# Patient Record
Sex: Female | Born: 1945 | Race: White | Hispanic: No | Smoking: Never smoker
Health system: Southern US, Community
[De-identification: ages and names within clinical notes are randomized; demographics above are authoritative.]

## PROBLEM LIST (undated history)

## (undated) DIAGNOSIS — R112 Nausea with vomiting, unspecified: Secondary | ICD-10-CM

## (undated) DIAGNOSIS — E119 Type 2 diabetes mellitus without complications: Secondary | ICD-10-CM

## (undated) DIAGNOSIS — I10 Essential (primary) hypertension: Secondary | ICD-10-CM

## (undated) DIAGNOSIS — Z955 Presence of coronary angioplasty implant and graft: Secondary | ICD-10-CM

## (undated) DIAGNOSIS — K219 Gastro-esophageal reflux disease without esophagitis: Secondary | ICD-10-CM

## (undated) DIAGNOSIS — Z9889 Other specified postprocedural states: Secondary | ICD-10-CM

## (undated) DIAGNOSIS — K760 Fatty (change of) liver, not elsewhere classified: Secondary | ICD-10-CM

## (undated) DIAGNOSIS — C50919 Malignant neoplasm of unspecified site of unspecified female breast: Secondary | ICD-10-CM

## (undated) DIAGNOSIS — I2510 Atherosclerotic heart disease of native coronary artery without angina pectoris: Secondary | ICD-10-CM

## (undated) DIAGNOSIS — C50911 Malignant neoplasm of unspecified site of right female breast: Secondary | ICD-10-CM

## (undated) HISTORY — PX: RETINAL DETACHMENT SURGERY: SHX105

## (undated) HISTORY — PX: TONSILLECTOMY: SUR1361

## (undated) HISTORY — DX: Malignant neoplasm of unspecified site of right female breast: C50.911

## (undated) HISTORY — PX: ABDOMINAL HYSTERECTOMY: SHX81

## (undated) HISTORY — PX: FRACTURE SURGERY: SHX138

## (undated) HISTORY — DX: Malignant neoplasm of unspecified site of unspecified female breast: C50.919

---

## 1999-08-08 ENCOUNTER — Ambulatory Visit (HOSPITAL_COMMUNITY): Admission: RE | Admit: 1999-08-08 | Discharge: 1999-08-09 | Payer: Self-pay | Admitting: *Deleted

## 2000-11-26 ENCOUNTER — Other Ambulatory Visit: Admission: RE | Admit: 2000-11-26 | Discharge: 2000-11-26 | Payer: Self-pay | Admitting: Family Medicine

## 2001-03-01 ENCOUNTER — Ambulatory Visit (HOSPITAL_COMMUNITY): Admission: RE | Admit: 2001-03-01 | Discharge: 2001-03-01 | Payer: Self-pay | Admitting: Cardiology

## 2001-03-01 ENCOUNTER — Encounter: Payer: Self-pay | Admitting: Cardiology

## 2002-04-08 ENCOUNTER — Encounter: Payer: Self-pay | Admitting: Family Medicine

## 2002-04-08 ENCOUNTER — Ambulatory Visit (HOSPITAL_COMMUNITY): Admission: RE | Admit: 2002-04-08 | Discharge: 2002-04-08 | Payer: Self-pay | Admitting: Family Medicine

## 2002-04-28 ENCOUNTER — Ambulatory Visit (HOSPITAL_COMMUNITY): Admission: RE | Admit: 2002-04-28 | Discharge: 2002-04-28 | Payer: Self-pay | Admitting: General Surgery

## 2003-10-09 ENCOUNTER — Ambulatory Visit (HOSPITAL_COMMUNITY): Admission: RE | Admit: 2003-10-09 | Discharge: 2003-10-09 | Payer: Self-pay | Admitting: Family Medicine

## 2003-10-16 ENCOUNTER — Ambulatory Visit (HOSPITAL_COMMUNITY): Admission: RE | Admit: 2003-10-16 | Discharge: 2003-10-16 | Payer: Self-pay | Admitting: Family Medicine

## 2005-12-11 ENCOUNTER — Ambulatory Visit (HOSPITAL_COMMUNITY): Admission: RE | Admit: 2005-12-11 | Discharge: 2005-12-11 | Payer: Self-pay | Admitting: Family Medicine

## 2005-12-17 ENCOUNTER — Ambulatory Visit (HOSPITAL_COMMUNITY): Admission: RE | Admit: 2005-12-17 | Discharge: 2005-12-17 | Payer: Self-pay | Admitting: Family Medicine

## 2006-01-06 ENCOUNTER — Ambulatory Visit: Payer: Self-pay | Admitting: Cardiology

## 2006-02-11 ENCOUNTER — Ambulatory Visit: Payer: Self-pay

## 2006-02-14 ENCOUNTER — Ambulatory Visit (HOSPITAL_COMMUNITY): Admission: RE | Admit: 2006-02-14 | Discharge: 2006-02-14 | Payer: Self-pay | Admitting: Family Medicine

## 2006-02-19 ENCOUNTER — Ambulatory Visit (HOSPITAL_COMMUNITY): Admission: RE | Admit: 2006-02-19 | Discharge: 2006-02-19 | Payer: Self-pay | Admitting: Family Medicine

## 2006-05-13 ENCOUNTER — Encounter: Admission: RE | Admit: 2006-05-13 | Discharge: 2006-05-13 | Payer: Self-pay | Admitting: Endocrinology

## 2006-05-13 ENCOUNTER — Encounter (INDEPENDENT_AMBULATORY_CARE_PROVIDER_SITE_OTHER): Payer: Self-pay | Admitting: Specialist

## 2006-05-13 ENCOUNTER — Other Ambulatory Visit: Admission: RE | Admit: 2006-05-13 | Discharge: 2006-05-13 | Payer: Self-pay | Admitting: Interventional Radiology

## 2007-02-08 ENCOUNTER — Ambulatory Visit (HOSPITAL_COMMUNITY): Admission: RE | Admit: 2007-02-08 | Discharge: 2007-02-08 | Payer: Self-pay | Admitting: Family Medicine

## 2007-02-19 ENCOUNTER — Ambulatory Visit: Payer: Self-pay | Admitting: Cardiology

## 2008-04-19 ENCOUNTER — Encounter: Admission: RE | Admit: 2008-04-19 | Discharge: 2008-04-19 | Payer: Self-pay | Admitting: Family Medicine

## 2008-04-19 ENCOUNTER — Encounter (INDEPENDENT_AMBULATORY_CARE_PROVIDER_SITE_OTHER): Payer: Self-pay | Admitting: Radiology

## 2008-04-25 ENCOUNTER — Ambulatory Visit: Payer: Self-pay | Admitting: Oncology

## 2008-04-26 ENCOUNTER — Encounter: Admission: RE | Admit: 2008-04-26 | Discharge: 2008-04-26 | Payer: Self-pay | Admitting: Family Medicine

## 2008-04-27 ENCOUNTER — Encounter (HOSPITAL_COMMUNITY): Admission: RE | Admit: 2008-04-27 | Discharge: 2008-05-27 | Payer: Self-pay | Admitting: Oncology

## 2008-04-27 ENCOUNTER — Ambulatory Visit (HOSPITAL_COMMUNITY): Payer: Self-pay | Admitting: Oncology

## 2008-04-28 ENCOUNTER — Ambulatory Visit (HOSPITAL_COMMUNITY): Admission: RE | Admit: 2008-04-28 | Discharge: 2008-04-28 | Payer: Self-pay | Admitting: Oncology

## 2008-04-28 ENCOUNTER — Encounter: Payer: Self-pay | Admitting: Oncology

## 2008-05-11 ENCOUNTER — Ambulatory Visit (HOSPITAL_BASED_OUTPATIENT_CLINIC_OR_DEPARTMENT_OTHER): Admission: RE | Admit: 2008-05-11 | Discharge: 2008-05-11 | Payer: Self-pay | Admitting: Surgery

## 2008-05-30 ENCOUNTER — Encounter (HOSPITAL_COMMUNITY): Admission: RE | Admit: 2008-05-30 | Discharge: 2008-06-29 | Payer: Self-pay | Admitting: Oncology

## 2008-06-05 ENCOUNTER — Ambulatory Visit (HOSPITAL_COMMUNITY): Admission: RE | Admit: 2008-06-05 | Discharge: 2008-06-05 | Payer: Self-pay | Admitting: Oncology

## 2008-06-10 ENCOUNTER — Ambulatory Visit (HOSPITAL_COMMUNITY): Payer: Self-pay | Admitting: Oncology

## 2008-06-12 ENCOUNTER — Ambulatory Visit: Payer: Self-pay | Admitting: Genetic Counselor

## 2008-06-28 ENCOUNTER — Encounter: Admission: RE | Admit: 2008-06-28 | Discharge: 2008-06-28 | Payer: Self-pay | Admitting: Oncology

## 2008-06-30 ENCOUNTER — Encounter (HOSPITAL_COMMUNITY): Admission: RE | Admit: 2008-06-30 | Discharge: 2008-07-30 | Payer: Self-pay | Admitting: Oncology

## 2008-07-31 ENCOUNTER — Ambulatory Visit (HOSPITAL_COMMUNITY): Payer: Self-pay | Admitting: Oncology

## 2008-07-31 ENCOUNTER — Encounter (HOSPITAL_COMMUNITY): Admission: RE | Admit: 2008-07-31 | Discharge: 2008-08-30 | Payer: Self-pay | Admitting: Oncology

## 2008-08-18 ENCOUNTER — Ambulatory Visit: Payer: Self-pay | Admitting: Genetic Counselor

## 2008-08-21 HISTORY — PX: BREAST SURGERY: SHX581

## 2008-09-13 ENCOUNTER — Encounter (INDEPENDENT_AMBULATORY_CARE_PROVIDER_SITE_OTHER): Payer: Self-pay | Admitting: Surgery

## 2008-09-13 ENCOUNTER — Encounter: Admission: RE | Admit: 2008-09-13 | Discharge: 2008-09-13 | Payer: Self-pay | Admitting: Surgery

## 2008-09-13 ENCOUNTER — Ambulatory Visit (HOSPITAL_COMMUNITY): Admission: RE | Admit: 2008-09-13 | Discharge: 2008-09-13 | Payer: Self-pay | Admitting: Surgery

## 2008-10-03 ENCOUNTER — Ambulatory Visit: Admission: RE | Admit: 2008-10-03 | Discharge: 2008-11-28 | Payer: Self-pay | Admitting: Radiation Oncology

## 2008-12-04 ENCOUNTER — Ambulatory Visit (HOSPITAL_COMMUNITY): Payer: Self-pay | Admitting: Oncology

## 2008-12-06 ENCOUNTER — Encounter (HOSPITAL_COMMUNITY): Admission: RE | Admit: 2008-12-06 | Discharge: 2008-12-06 | Payer: Self-pay | Admitting: Oncology

## 2009-02-14 ENCOUNTER — Encounter (INDEPENDENT_AMBULATORY_CARE_PROVIDER_SITE_OTHER): Payer: Self-pay | Admitting: *Deleted

## 2009-04-26 ENCOUNTER — Encounter: Admission: RE | Admit: 2009-04-26 | Discharge: 2009-04-26 | Payer: Self-pay | Admitting: Oncology

## 2009-05-08 ENCOUNTER — Encounter (HOSPITAL_COMMUNITY): Admission: RE | Admit: 2009-05-08 | Discharge: 2009-06-07 | Payer: Self-pay | Admitting: Oncology

## 2009-05-08 ENCOUNTER — Ambulatory Visit (HOSPITAL_COMMUNITY): Payer: Self-pay | Admitting: Oncology

## 2009-09-06 DIAGNOSIS — C50911 Malignant neoplasm of unspecified site of right female breast: Secondary | ICD-10-CM

## 2009-09-06 DIAGNOSIS — E785 Hyperlipidemia, unspecified: Secondary | ICD-10-CM | POA: Insufficient documentation

## 2009-09-06 DIAGNOSIS — I10 Essential (primary) hypertension: Secondary | ICD-10-CM | POA: Insufficient documentation

## 2009-09-06 DIAGNOSIS — I2510 Atherosclerotic heart disease of native coronary artery without angina pectoris: Secondary | ICD-10-CM | POA: Insufficient documentation

## 2009-09-06 DIAGNOSIS — E782 Mixed hyperlipidemia: Secondary | ICD-10-CM | POA: Insufficient documentation

## 2009-09-06 HISTORY — DX: Malignant neoplasm of unspecified site of right female breast: C50.911

## 2009-09-12 ENCOUNTER — Ambulatory Visit: Payer: Self-pay | Admitting: Cardiology

## 2009-09-12 DIAGNOSIS — R609 Edema, unspecified: Secondary | ICD-10-CM | POA: Insufficient documentation

## 2009-09-12 DIAGNOSIS — R0602 Shortness of breath: Secondary | ICD-10-CM | POA: Insufficient documentation

## 2009-10-01 ENCOUNTER — Telehealth (INDEPENDENT_AMBULATORY_CARE_PROVIDER_SITE_OTHER): Payer: Self-pay | Admitting: *Deleted

## 2009-10-02 ENCOUNTER — Ambulatory Visit: Payer: Self-pay | Admitting: Cardiology

## 2009-10-02 ENCOUNTER — Encounter: Payer: Self-pay | Admitting: Cardiology

## 2009-10-02 ENCOUNTER — Ambulatory Visit: Payer: Self-pay

## 2009-10-02 ENCOUNTER — Encounter (HOSPITAL_COMMUNITY): Admission: RE | Admit: 2009-10-02 | Discharge: 2009-11-21 | Payer: Self-pay | Admitting: Cardiology

## 2009-10-02 ENCOUNTER — Ambulatory Visit (HOSPITAL_COMMUNITY): Admission: RE | Admit: 2009-10-02 | Discharge: 2009-10-02 | Payer: Self-pay | Admitting: Cardiology

## 2009-10-10 ENCOUNTER — Telehealth: Payer: Self-pay | Admitting: Cardiology

## 2009-11-06 ENCOUNTER — Ambulatory Visit (HOSPITAL_COMMUNITY): Payer: Self-pay | Admitting: Oncology

## 2009-11-06 ENCOUNTER — Encounter (HOSPITAL_COMMUNITY): Admission: RE | Admit: 2009-11-06 | Discharge: 2009-12-06 | Payer: Self-pay | Admitting: Oncology

## 2009-12-03 ENCOUNTER — Ambulatory Visit: Payer: Self-pay | Admitting: Internal Medicine

## 2009-12-03 DIAGNOSIS — K219 Gastro-esophageal reflux disease without esophagitis: Secondary | ICD-10-CM | POA: Insufficient documentation

## 2009-12-03 DIAGNOSIS — K921 Melena: Secondary | ICD-10-CM | POA: Insufficient documentation

## 2009-12-18 ENCOUNTER — Telehealth (INDEPENDENT_AMBULATORY_CARE_PROVIDER_SITE_OTHER): Payer: Self-pay

## 2009-12-19 ENCOUNTER — Ambulatory Visit (HOSPITAL_COMMUNITY): Admission: RE | Admit: 2009-12-19 | Discharge: 2009-12-19 | Payer: Self-pay | Admitting: Internal Medicine

## 2009-12-19 ENCOUNTER — Ambulatory Visit: Payer: Self-pay | Admitting: Gastroenterology

## 2010-05-07 ENCOUNTER — Encounter (HOSPITAL_COMMUNITY): Admission: RE | Admit: 2010-05-07 | Payer: Self-pay | Admitting: Oncology

## 2010-05-24 ENCOUNTER — Encounter: Admission: RE | Admit: 2010-05-24 | Discharge: 2010-05-24 | Payer: Self-pay | Admitting: Oncology

## 2010-07-14 ENCOUNTER — Encounter: Payer: Self-pay | Admitting: Oncology

## 2010-07-14 ENCOUNTER — Encounter: Payer: Self-pay | Admitting: Endocrinology

## 2010-07-14 ENCOUNTER — Encounter (HOSPITAL_COMMUNITY): Payer: Self-pay | Admitting: Oncology

## 2010-07-14 ENCOUNTER — Encounter: Payer: Self-pay | Admitting: Family Medicine

## 2010-07-25 NOTE — Assessment & Plan Note (Signed)
Summary: OCCASIONAL BLOOD AFTER BM/SS   Visit Type:  Initial Consult Referring Provider:  Mariel Sleet Primary Care Provider:  Phillips Odor  Chief Complaint:  occasional blood after BM.  History of Present Illness: Erika Ramos is a very pleasant 65 y/o WF, patient of Dr. Glenford Peers, who presents for further evaluation of intermittent brbpr. She has had six episodes of brbpr in last six months. Sometimes after constipation but not always. BM usually most days. Certain foods may cause loose or frequent stools. If miss Prevacid then terrible acid reflux. No prior EGD. Heartburn at least 10 years. Denies dysphagia or odynophagia. No abd pain.   She had TCS about 8 years ago by Dr. Lovell Sheehan, no polyps.  Current Medications (verified): 1)  Ramipril 10 Mg Caps (Ramipril) .Marland Kitchen.. 1 Cap Once Daily 2)  Simvastatin 20 Mg Tabs (Simvastatin) .Marland Kitchen.. 1  Once Daily 3)  Multivitamins  Tabs (Multiple Vitamin) .Marland Kitchen.. 1 Once Daily 4)  Aspirin 81 Mg Tbec (Aspirin) .... Take 1 Tablet By Mouth Once A Day 5)  Prevacid 15 Mg Cpdr (Lansoprazole)  Otc .... Take 1 Tablet By Mouth Once A Day 6)  Vitamin B Complex .... One Tablet Daily  Allergies (verified): 1)  ! 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   SURGEON:  Maisie Fus A. Cornett, MD  Right axillary lymph node dissection.  Explantation of right subclavian Port-A-Cath.  Hysterectomy coronary stent placement, 2001  Family History: Mother: deceased @ 12.Marland KitchenOvarian Ca and MI..1st MI age 46 Grandparents, CVA Father, deceased @83 , trauma to head-blood clot in head Family History of Coronary Artery Disease:  Siblings: 3 brothers and 3 sisters..1 brother had CABG x 5 @ 46 No FH of CRC, liver disease,  colon polyps  Social History: Single. One dgt, One son. 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 Never smoked. No alcohol.   Review of Systems General:  Denies fever, chills, sweats, anorexia, fatigue, weakness, and weight loss. Eyes:  Denies vision loss. ENT:  Denies nasal congestion, sore throat, hoarseness, and difficulty swallowing. CV:  Denies chest pains, angina, palpitations, dyspnea on exertion, and peripheral edema. Resp:  Denies dyspnea at rest, dyspnea with exercise, cough, sputum, and wheezing. GI:  See HPI. GU:  Denies urinary burning and blood in urine. MS:  Denies joint pain / LOM. Derm:  Denies rash and itching. Neuro:  Denies weakness, frequent headaches, memory loss, and confusion. Psych:  Denies depression and anxiety. Endo:  Denies unusual weight change. Heme:  Denies bruising and bleeding. Allergy:  Denies hives and rash.  Vital Signs:  Patient profile:   65 year old female Height:      66.5 inches Weight:      219 pounds BMI:     34.94 Temp:     98.4 degrees F oral Pulse rate:   72 / minute BP sitting:   120 / 78  (left arm) Cuff size:   large  Vitals Entered By: Cloria Spring LPN (December 03, 2009 1:24 PM)  Physical Exam  General:  Well developed, well nourished, no acute distress.obese.   Head:  Normocephalic and atraumatic. Eyes:  sclera nonicteric Mouth:  Oropharyngeal mucosa moist, pink.  No lesions, erythema or exudate.    Neck:  Supple; no masses or thyromegaly. Lungs:  Clear throughout to auscultation. Heart:  Regular rate and rhythm; no murmurs, rubs,  or  bruits. Abdomen:  Bowel sounds normal.  Abdomen is soft, nontender, nondistended.  No rebound or guarding.  No hepatosplenomegaly, masses or hernias.  No abdominal bruits. obese.   Rectal:  deferred until time of colonoscopy.   Extremities:  No clubbing, cyanosis, edema or deformities noted. Neurologic:  Alert and  oriented x4;  grossly normal neurologically. Skin:  Intact without significant lesions or  rashes. Cervical Nodes:  No significant cervical adenopathy. Psych:  Alert and cooperative. Normal mood and affect.  Impression & Recommendations:  Problem # 1:  HEMATOCHEZIA (ICD-578.1)  Intermittent brbpr with remote TCS by Dr. Lovell Sheehan. Likely benign anorectal source, but needs further evaluation. Colonoscopy to be performed in near future.  Risks, alternatives, and benefits including but not limited to the risk of reaction to medication, bleeding, infection, and perforation were addressed.  Patient voiced understanding and provided verbal consent.   With h/o breast cancer, no FH CRC, will make decision regarding timing of subsequent colonoscopies based on findings.  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 No prior EGD. Recommend EGD for surveillance for complications of GERD ie Barrett's. EGD to be performed in near future.  Risks, alternatives, benefits including but not limited to risk of reaction to medications, bleeding, infection, and perforation addressed.  Patient voiced understanding and verbal consent obtained. Continue Prevacid 24hr once daily.  Orders: Consultation Level IV (30865) I would like to thank Dr. Glenford Peers for allowing [place] to take part in the care of this nice patient.

## 2010-07-25 NOTE — Letter (Signed)
Summary: TCS/EGD ORDER  TCS/EGD ORDER   Imported By: Ave Filter 12/03/2009 14:34:11  _____________________________________________________________________  External Attachment:    Type:   Image     Comment:   External Document  Appended Document: TCS/EGD ORDER TCS/EGD TRILYTE PREP ON 6/29.  Appended Document: TCS/EGD ORDER Pt scheduled for 12/19/2009 @ 8:15 and to be at hospital @ 7:15. Rx and instructions faxed to [facility].

## 2010-07-25 NOTE — Assessment & Plan Note (Signed)
Summary: Erika Ramos   Primary Provider:  Dr. Phillips Odor   History of Present Illness: Erika Ramos comes in today for evaluation and management of her history of coronary artery disease, percutaneous intervention in 2001, and recent increase in dyspnea on exertion.  She has had a diagnosis of breast cancer since we last saw her. She had both chemotherapy and radiation. She does not remember the type of chemotherapy. I have no records.  She denies orthopnea or PND but has had some peripheral edema. She denies any angina per se. She's had no palpitations or syncope.  She remains heavy. Weight is not changed that much however.  Current Medications (verified): 1)  Aspirin Ec 325 Mg Tbec (Aspirin) .... Take One Tablet By Mouth Daily 2)  Omeprazole 20 Mg Cpdr (Omeprazole) .Marland Kitchen.. 1 Tab Once Daily 3)  Ramipril 10 Mg Caps (Ramipril) .Marland Kitchen.. 1 Cap Once Daily 4)  Simvastatin 20 Mg Tabs (Simvastatin) .Marland Kitchen.. 1  Once Daily 5)  Multivitamins  Tabs (Multiple Vitamin) .Marland Kitchen.. 1 Once Daily  Allergies: 1)  ! 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 Right breast needle-localized lumpectomy.   SURGEON:  Maisie Fus A. Cornett, MD   2. Right axillary lymph node dissection.   3. Explantation of right subclavian Port-A-Cath.     Hysterectomy coronary stent placement  Family History: Last updated: September 19, 2009 Mother: deceased @ 60.Marland KitchenOvarian Ca and MI..1st MI age 62 Family History of Coronary Artery Disease:  Siblings: 3 brothers and 3 sisters..1 brother had CABG x 5 @ 52  Social History: Last updated: 09/19/2009 Full Time Part Time  Single   Review of Systems       negative other than history of present illness  Vital Signs:  Patient profile:   65 year old  female Height:      66.5 inches Weight:      210 pounds BMI:     33.51 Pulse rate:   87 / minute Pulse rhythm:   irregular BP sitting:   108 / 80  (left arm) Cuff size:   large  Physical Exam  General:  obese.   Head:  normocephalic and atraumatic Eyes:  PERRLA/EOM intact; conjunctiva and lids normal. Neck:  Neck supple, no JVD. No masses, thyromegaly or abnormal cervical nodes. Chest Chameka Mcmullen:  no deformities or breast masses noted Lungs:  Clear bilaterally to auscultation and percussion. Heart:  PMI is hard to appreciate, regular rate and rhythm, normal S1-S2, no gallop, no bruits Msk:  decreased ROM.   Pulses:  pulses normal in all 4 extremities Extremities:  1+ left pedal edema and 1+ right pedal edema.   Neurologic:  Alert and oriented x 3. Skin:  Intact without lesions or rashes. Psych:  Normal affect.   Problems:  Medical Problems Added: 1)  Dx of Dyspnea  (ICD-786.05)  EKG  Procedure date:  09/12/2009  Findings:      normal sinus rhythm, left anterior fascicular block, no significant change, poor R. progression in the anterior and lateral precordium.  Impression & Recommendations:  Problem # 1:  CAD, NATIVE VESSEL (ICD-414.01)  She Has not had objective assessment of her coronary disease in 4 years. We will obtain a stress Myoview. With her history of chemotherapy and radiation, disease more important. We will also obtain a 2-D echocardiogram to assess her LV function. Her updated medication  list for this problem includes:    Aspirin Ec 325 Mg Tbec (Aspirin) .Marland Kitchen... Take one tablet by mouth daily    Ramipril 10 Mg Caps (Ramipril) .Marland Kitchen... 1 cap once daily  Her updated medication list for this problem includes:    Aspirin Ec 325 Mg Tbec (Aspirin) .Marland Kitchen... Take one tablet by mouth daily    Ramipril 10 Mg Caps (Ramipril) .Marland Kitchen... 1 cap once daily  Orders: EKG w/ Interpretation (93000)  Problem # 2:  DYSPNEA (ICD-786.05) Assessment: New  This could be an ischemic  equivalent. Am also concerned that she may have had some cardiac damage from the chemotherapy. We'll arrange a 2-D echocardiogram and stress test as mentioned above. Her updated medication list for this problem includes:    Aspirin Ec 325 Mg Tbec (Aspirin) .Marland Kitchen... Take one tablet by mouth daily    Ramipril 10 Mg Caps (Ramipril) .Marland Kitchen... 1 cap once daily  Orders: Echocardiogram (Echo) Nuclear Stress Test (Nuc Stress Test)  Patient Instructions: 1)  Your physician recommends that you schedule a follow-up appointment in: YEAR WITH DR English Tomer 2)  Your physician recommends that you continue on your current medications as directed. Please refer to the Current Medication list given to you today. 3)  Your physician has requested that you have an echocardiogram.  Echocardiography is a painless test that uses sound waves to create images of your heart. It provides your doctor with information about the size and shape of your heart and how well your heart's chambers and valves are working.  This procedure takes approximately one hour. There are no restrictions for this procedure. 4)  Your physician has requested that you have an exercise stress myoview.  For further information please visit [email or web address]  Please follow instruction sheet, as given.

## 2010-07-25 NOTE — Letter (Signed)
Summary: Internal Other/Rx and instructions for procedure  Internal Other/Rx and instructions for procedure   Imported By: Cloria Spring LPN 16/03/9603 54:09:81  _____________________________________________________________________  External Attachment:    Type:   Image     Comment:   External Document

## 2010-07-25 NOTE — Progress Notes (Signed)
Summary: call back  Phone Note Call from Patient Call back at Home Phone [phone] Call back at Work Phone [phone] Call back at ext [phone]   Caller: Patient Reason for Call: Talk to Nurse Summary of Call: returning christine call Initial call taken by: Lorne Skeens,  October 10, 2009 9:25 AM  Follow-up for Phone Call        Phone Call Completed Follow-up by: Scherrie Bateman, LPN,  October 10, 2009 9:31 AM

## 2010-07-25 NOTE — Assessment & Plan Note (Signed)
Summary: F2Y/ GD   Visit Type:  2 yr f/u Primary Provider:  Dr. Phillips Odor  CC:  no cardiac complaints today.  History of Present Illness: [name] as today for evaluation for coronary artery disease and hypertension.  She's been on remarkably well. She denies any angina, palpitations, shortness of breath, dyspnea on exertion, orthopnea, PND. She has had some lower showing edema from her superficial varicose veins. She denies any pleuritic chest pain, swelling or redness, or claudication.  She is being seen by a pain specialist for the pain from the superficial veins.  Current Medications (verified): 1)  Aspirin Ec 325 Mg Tbec (Aspirin) .... Take One Tablet By Mouth Daily 2)  Omeprazole 20 Mg Cpdr (Omeprazole) .Marland Kitchen.. 1 Tab Once Daily 3)  Ramipril 10 Mg Caps (Ramipril) .Marland Kitchen.. 1 Cap Once Daily 4)  Simvastatin 20 Mg Tabs (Simvastatin) .Marland Kitchen.. 1  Once Daily 5)  Multivitamins  Tabs (Multiple Vitamin) .Marland Kitchen.. 1 Once Daily  Allergies: 1)  ! 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 Right breast needle-localized lumpectomy.   SURGEON:  Maisie Fus A. Cornett, MD   2. Right axillary lymph node dissection.   3. Explantation of right subclavian Port-A-Cath.     Hysterectomy coronary stent placement  Family History: Last updated: 09-10-09 Mother: deceased @ 39.Marland KitchenOvarian Ca and MI..1st MI age 20 Family History of Coronary Artery Disease:  Siblings: 3 brothers and 3 sisters..1 brother had CABG x 5 @ 15  Social History: Last updated: 09-10-09 Full Time Part Time  Single   Review of Systems       negative other than history of present illness  Vital Signs:  Patient profile:   65 year old female Height:      66.50 inches Weight:      210 pounds BMI:      33.51 Pulse rate:   87 / minute Pulse rhythm:   irregular BP sitting:   108 / 80  (left arm) Cuff size:   large  Vitals Entered By: Scherrie Bateman, LPN (September 12, 2009 2:34 PM)  Physical Exam  General:  obese.   Head:  normocephalic and atraumatic Eyes:  PERRLA/EOM intact; conjunctiva and lids normal. Neck:  Neck supple, no JVD. No masses, thyromegaly or abnormal cervical nodes. Chest Cheril Slattery:  no deformities or breast masses noted Lungs:  Clear bilaterally to auscultation and percussion. Heart:  PMI not displaced, normal S1-S2, regular rate and rhythm Abdomen:  Bowel sounds positive; abdomen soft and non-tender without masses, organomegaly, or hernias noted. No hepatosplenomegaly. Msk:  Back normal, normal gait. Muscle strength and tone normal. Pulses:  pulses normal in all 4 extremities Extremities:  varicose veins, 1+ pitting edema at the ankles. Neurologic:  Alert and oriented x 3. Skin:  Intact without lesions or rashes. Psych:  Normal affect.   Problems:  Medical Problems Added: 1)  Dx of Edema  (ICD-782.3)  EKG  Procedure date:  09/12/2009  Findings:      normal sinus rhythm, low voltage QRS, poor R. progression, no change  Impression & Recommendations:  Problem # 1:  CAD, NATIVE VESSEL (ICD-414.01)  Her updated medication list for this problem includes:    Aspirin Ec 325 Mg Tbec (Aspirin) .Marland Kitchen... Take one tablet by mouth daily    Ramipril 10 Mg Caps (Ramipril) .Marland Kitchen... 1 cap once daily  Problem #  2:  HYPERTENSION (ICD-401.9) Assessment: Improved  Her updated medication list for this problem includes:    Aspirin Ec 325 Mg Tbec (Aspirin) .Marland Kitchen... Take one tablet by mouth daily    Ramipril 10 Mg Caps (Ramipril) .Marland Kitchen... 1 cap once daily  Problem # 3:  EDEMA (ICD-782.3) Assessment: Deteriorated She is already seen a specialist. Do not feel she needs a diuretic.

## 2010-07-25 NOTE — Assessment & Plan Note (Signed)
Summary: Cardiology Nuclear Study  Nuclear Med Background Indications for Stress Test: Evaluation for Ischemia, Stent Patency, PTCA Patency   History: Abnormal EKG, Angioplasty, Asthma, COPD, Heart Catheterization, History of Chemo, Myocardial Perfusion Study, Stents  History Comments: '01 Heart Cath EF 60% 2V DZ '01 Angiolpasty PDA-RCA Stents-RCA Chemo/Radiation 08/07 MPS NL EF 82% 11/09 MUGA EF 67%  Symptoms: DOE    Nuclear Pre-Procedure Cardiac Risk Factors: Family History - CAD, Hypertension, Lipids Caffeine/Decaff Intake: None NPO After: 8:00 PM Lungs: Clear IV 0.9% NS with Angio Cath: 22g     IV Site: (L) Wrist IV Started by: Irean Hong RN Chest Size (in) 44     Cup Size DD     Height (in): 66.5 Weight (lb): 208 BMI: 33.19  Nuclear Med Study 1 or 2 day study:  1 day     Stress Test Type:  Stress Reading MD:  Marca Ancona, MD     Referring MD:  T.Wall Resting Radionuclide:  Technetium 45m Tetrofosmin     Resting Radionuclide Dose:  11.0 mCi  Stress Radionuclide:  Technetium 76m Tetrofosmin     Stress Radionuclide Dose:  33.0 mCi   Stress Protocol Exercise Time (min):  6:00 min     Max HR:  150 bpm     Predicted Max HR:  158 bpm  Max Systolic BP: 184 mm Hg     Percent Max HR:  94.94 %     METS: 7.0 Rate Pressure Product:  16109    Stress Test Technologist:  Irean Hong RN     Nuclear Technologist:  Domenic Polite CNMT  Rest Procedure  Myocardial perfusion imaging was performed at rest 45 minutes following the intravenous administration of Myoview Technetium 9m Tetrofosmin.  Stress Procedure  The patient exercised for six minutes.   The patient stopped due to DOE   and denied any chest pain.  There were nonspecific ST-T wave changes, rare PAC.  Myoview was injected at peak exercise and myocardial perfusion imaging was performed after a brief delay.  QPS Raw Data Images:  Breast shadow Stress Images:  Small apical perfusion defect, larger than rest.  Rest  Images:  Small apical perfusion defect.  Subtraction (SDS):  Partially reversible apical perfusion defect.  Transient Ischemic Dilatation:  .98  (Normal <1.22)  Lung/Heart Ratio:  .38  (Normal <0.45)  Quantitative Gated Spect Images QGS EDV:  72 ml QGS ESV:  22 ml QGS EF:  69 % QGS cine images:  Normal wall motion.    Overall Impression  Exercise Capacity: Fair exercise capacity. BP Response: Hypertensive blood pressure response. Clinical Symptoms: Dyspnea, no chest pain.  ECG Impression: Insignificant upsloping ST segment depression. Overall Impression: Small partially reversible apical defect.  Cannot rule out small ischemic region but there was significant breast shadowing on planar images and this could represent shifting breast artifact.  Overall Impression Comments: Overall low risk study.   Appended Document: Cardiology Nuclear Study good results. Heart is strong. No change in treatment.  Appended Document: Cardiology Nuclear Study LMTCB./CY  Appended Document: Cardiology Nuclear Study LMTCB./CY  Appended Document: Cardiology Nuclear Study PT AWARE./[place]

## 2010-07-25 NOTE — Progress Notes (Signed)
Summary: returning call  Phone Note Call from Patient   Summary of Call: patient called back to speak with Tyler Aas, she would like you to call her back at [phone].  She said this was about a procedure being done before Friday. Initial call taken by: Curtis Sites,  December 18, 2009 10:43 AM

## 2010-07-25 NOTE — Progress Notes (Signed)
Summary: Nuclear Pre-Procedure  Phone Note Outgoing Call   Call placed by: Milana Na, EMT-P,  October 01, 2009 4:15 PM Summary of Call: Left message with information on Myoview Information Sheet (see scanned document for details).      Nuclear Med Background Indications for Stress Test: Evaluation for Ischemia, Stent Patency, PTCA Patency   History: Abnormal EKG, Angioplasty, Asthma, Heart Catheterization, History of Chemo, Myocardial Perfusion Study, Stents  History Comments: '01 Heart Cath EF 60% 2V DZ '01 Angiolpasty PDA-RCA Stents-RCA Chemo/Radiation 08/07 MPS NL EF 82% 11/09 MUGA EF 67%  Symptoms: DOE    Nuclear Pre-Procedure Cardiac Risk Factors: Family History - CAD, Hypertension, Lipids Height (in): 66.5  Nuclear Med Study Referring MD:  T.Wall

## 2010-09-09 LAB — DIFFERENTIAL
Basophils Absolute: 0 10*3/uL (ref 0.0–0.1)
Basophils Relative: 0 % (ref 0–1)
Eosinophils Absolute: 0.2 10*3/uL (ref 0.0–0.7)
Monocytes Absolute: 0.5 10*3/uL (ref 0.1–1.0)
Monocytes Relative: 8 % (ref 3–12)
Neutro Abs: 3.9 10*3/uL (ref 1.7–7.7)

## 2010-09-09 LAB — COMPREHENSIVE METABOLIC PANEL
ALT: 46 U/L — ABNORMAL HIGH (ref 0–35)
Albumin: 4 g/dL (ref 3.5–5.2)
Alkaline Phosphatase: 87 U/L (ref 39–117)
BUN: 12 mg/dL (ref 6–23)
Chloride: 108 mEq/L (ref 96–112)
Glucose, Bld: 96 mg/dL (ref 70–99)
Potassium: 4.4 mEq/L (ref 3.5–5.1)
Sodium: 138 mEq/L (ref 135–145)
Total Bilirubin: 0.5 mg/dL (ref 0.3–1.2)
Total Protein: 6.7 g/dL (ref 6.0–8.3)

## 2010-09-09 LAB — HEPATIC FUNCTION PANEL
Bilirubin, Direct: <0.1 mg/dL (ref 0.0–0.3)
Indirect Bilirubin: 0.5 mg/dL (ref 0.3–0.9)
Total Protein: 6.2 g/dL (ref 6.0–8.3)

## 2010-09-09 LAB — CBC
HCT: 40.5 % (ref 36.0–46.0)
Hemoglobin: 14 g/dL (ref 12.0–15.0)
Platelets: 214 10*3/uL (ref 150–400)
WBC: 6.5 10*3/uL (ref 4.0–10.5)

## 2010-09-25 LAB — DIFFERENTIAL
Basophils Relative: 0 % (ref 0–1)
Eosinophils Absolute: 0.1 10*3/uL (ref 0.0–0.7)
Lymphs Abs: 1.5 10*3/uL (ref 0.7–4.0)
Monocytes Absolute: 0.4 10*3/uL (ref 0.1–1.0)
Monocytes Relative: 8 % (ref 3–12)
Neutrophils Relative %: 62 % (ref 43–77)

## 2010-09-25 LAB — CBC
Hemoglobin: 13.6 g/dL (ref 12.0–15.0)
MCHC: 33.7 g/dL (ref 30.0–36.0)
Platelets: 210 10*3/uL (ref 150–400)
RDW: 13.3 % (ref 11.5–15.5)

## 2010-09-25 LAB — COMPREHENSIVE METABOLIC PANEL
ALT: 38 U/L — ABNORMAL HIGH (ref 0–35)
AST: 37 U/L (ref 0–37)
Albumin: 3.7 g/dL (ref 3.5–5.2)
Alkaline Phosphatase: 90 U/L (ref 39–117)
Calcium: 9.6 mg/dL (ref 8.4–10.5)
GFR calc Af Amer: >60 mL/min (ref >60)
Potassium: 4.2 mEq/L (ref 3.5–5.1)
Sodium: 137 mEq/L (ref 135–145)
Total Protein: 7.1 g/dL (ref 6.0–8.3)

## 2010-10-03 LAB — DIFFERENTIAL
Basophils Absolute: 0.1 10*3/uL (ref 0.0–0.1)
Basophils Relative: 1 % (ref 0–1)
Lymphocytes Relative: 5 % — ABNORMAL LOW (ref 12–46)
Neutro Abs: 11.6 10*3/uL — ABNORMAL HIGH (ref 1.7–7.7)
Neutrophils Relative %: 92 % — ABNORMAL HIGH (ref 43–77)

## 2010-10-03 LAB — CBC
HCT: 29.7 % — ABNORMAL LOW (ref 36.0–46.0)
Hemoglobin: 9.9 g/dL — ABNORMAL LOW (ref 12.0–15.0)
MCHC: 33.2 g/dL (ref 30.0–36.0)
MCHC: 33.7 g/dL (ref 30.0–36.0)
Platelets: 335 10*3/uL (ref 150–400)
RBC: 3.25 MIL/uL — ABNORMAL LOW (ref 3.87–5.11)
RDW: 18.1 % — ABNORMAL HIGH (ref 11.5–15.5)
RDW: 17.3 % — ABNORMAL HIGH (ref 11.5–15.5)

## 2010-10-03 LAB — BASIC METABOLIC PANEL
BUN: 4 mg/dL — ABNORMAL LOW (ref 6–23)
CO2: 25 mEq/L (ref 19–32)
Calcium: 8.9 mg/dL (ref 8.4–10.5)
Glucose, Bld: 114 mg/dL — ABNORMAL HIGH (ref 70–99)
Sodium: 142 mEq/L (ref 135–145)

## 2010-10-07 LAB — DIFFERENTIAL
Basophils Absolute: 0 10*3/uL (ref 0.0–0.1)
Basophils Relative: 0 % (ref 0–1)
Lymphocytes Relative: 28 % (ref 12–46)
Lymphs Abs: 1.8 10*3/uL (ref 0.7–4.0)
Monocytes Absolute: 1.1 10*3/uL — ABNORMAL HIGH (ref 0.1–1.0)
Monocytes Relative: 16 % — ABNORMAL HIGH (ref 3–12)
Monocytes Relative: 7 % (ref 3–12)
Neutro Abs: 3.5 10*3/uL (ref 1.7–7.7)
Neutro Abs: 11.9 10*3/uL — ABNORMAL HIGH (ref 1.7–7.7)
Neutrophils Relative %: 84 % — ABNORMAL HIGH (ref 43–77)

## 2010-10-07 LAB — CBC
HCT: 36.5 % (ref 36.0–46.0)
Hemoglobin: 12.5 g/dL (ref 12.0–15.0)
Hemoglobin: 12.1 g/dL (ref 12.0–15.0)
MCHC: 33.1 g/dL (ref 30.0–36.0)
MCV: 85.3 fL (ref 78.0–100.0)
RBC: 4.4 MIL/uL (ref 3.87–5.11)
RDW: 16.2 % — ABNORMAL HIGH (ref 11.5–15.5)

## 2010-10-07 LAB — COMPREHENSIVE METABOLIC PANEL
Alkaline Phosphatase: 69 U/L (ref 39–117)
BUN: 9 mg/dL (ref 6–23)
Calcium: 9.8 mg/dL (ref 8.4–10.5)
Glucose, Bld: 158 mg/dL — ABNORMAL HIGH (ref 70–99)
Potassium: 3.9 mEq/L (ref 3.5–5.1)
Total Protein: 6.6 g/dL (ref 6.0–8.3)

## 2010-10-08 LAB — COMPREHENSIVE METABOLIC PANEL
ALT: 57 U/L — ABNORMAL HIGH (ref 0–35)
ALT: 33 U/L (ref 0–35)
AST: 27 U/L (ref 0–37)
Albumin: 3.2 g/dL — ABNORMAL LOW (ref 3.5–5.2)
Alkaline Phosphatase: 50 U/L (ref 39–117)
CO2: 20 mEq/L (ref 19–32)
CO2: 23 mEq/L (ref 19–32)
Calcium: 9.1 mg/dL (ref 8.4–10.5)
Chloride: 103 mEq/L (ref 96–112)
GFR calc Af Amer: >60 mL/min (ref >60)
GFR calc non Af Amer: 57 mL/min — ABNORMAL LOW (ref >60)
GFR calc non Af Amer: >60 mL/min (ref >60)
Glucose, Bld: 154 mg/dL — ABNORMAL HIGH (ref 70–99)
Potassium: 3.9 mEq/L (ref 3.5–5.1)
Sodium: 138 mEq/L (ref 135–145)
Sodium: 138 mEq/L (ref 135–145)
Total Bilirubin: 0.8 mg/dL (ref 0.3–1.2)
Total Bilirubin: 0.3 mg/dL (ref 0.3–1.2)

## 2010-10-08 LAB — DIFFERENTIAL
Basophils Relative: 0 % (ref 0–1)
Eosinophils Absolute: 0 10*3/uL (ref 0.0–0.7)
Eosinophils Absolute: 0 10*3/uL (ref 0.0–0.7)
Eosinophils Relative: 0 % (ref 0–5)
Lymphocytes Relative: 5 % — ABNORMAL LOW (ref 12–46)
Lymphs Abs: 0.7 10*3/uL (ref 0.7–4.0)
Monocytes Absolute: 0.4 10*3/uL (ref 0.1–1.0)
Neutrophils Relative %: 86 % — ABNORMAL HIGH (ref 43–77)

## 2010-10-08 LAB — CBC
Hemoglobin: 11.7 g/dL — ABNORMAL LOW (ref 12.0–15.0)
MCHC: 34 g/dL (ref 30.0–36.0)
RBC: 4 MIL/uL (ref 3.87–5.11)
RBC: 3.37 MIL/uL — ABNORMAL LOW (ref 3.87–5.11)
WBC: 14.3 10*3/uL — ABNORMAL HIGH (ref 4.0–10.5)

## 2010-11-05 NOTE — Op Note (Signed)
NAMENakai, Erika Ramos ANN                  ACCOUNT NO.:  [id number]   MEDICAL RECORD NO.:  [id number]          PATIENT TYPE:  AMB   LOCATION:  SDS                          FACILITY:  [facility]   PHYSICIAN:  Thomas A. Cornett, M.D.DATE OF BIRTH:  31-Aug-1945   DATE OF PROCEDURE:  09/13/2008  DATE OF DISCHARGE:  09/13/2008                               OPERATIVE REPORT   PREOPERATIVE DIAGNOSIS:  Right breast cancer, T2N1MX.   POSTOPERATIVE DIAGNOSIS:  1. Right breast cancer, T2N1MX.   PROCEDURES:  1. Right breast needle-localized lumpectomy.  2. Right axillary lymph node dissection.  3. Explantation of right subclavian Port-A-Cath.   SURGEON:  Maisie Fus A. Cornett, MD   ASSISTANT:  Wilmon Arms. Tsuei, MD.   ANESTHESIA:  LMA with 0.25% Sensorcaine local.   ESTIMATED BLOOD LOSS:  50 mL.   SPECIMEN:  1. Right breast tissue with localizing wire and clip, verified by      radiography to be correct, to be the specimen.  2. Right axillary contents.   DRAIN:  A 19-Blake drain in the right axilla.   INDICATIONS FOR PROCEDURE:  The patient is a 65 year old female who  presents after undergoing neoadjuvant chemotherapy for a T2N1MX right  breast cancer.  She wished to conserve her breast.  She did have a  positive lymph node initially and thus axillary lymph node dissection  was recommended.   DESCRIPTION OF PROCEDURE:  The patient was brought to the operating room  and placed supine after wire localization of her right breast.  After  induction of general anesthesia, the right axilla and right chest region  were prepped and draped in sterile fashion.  Her Port-A-Cath was  identified and was easily palpable.  Incision was made through the old  scar and the cavity with a Port-A-Cath was opened.  I placed my finger  tracked from the catheter into the right subclavian vein and pulled the  catheter out easily after cutting the sutures allowing into the chest  wall.  This was then passed off the field.   I closed the tract with 3-0  Vicryl.  I used a combination of 3-0 Vicryl and 3-0 Monocryl close the  subcu tissues and skin in a subcuticular fashion.  Dermabond was  applied.   Next, right breast lumpectomy was done.  The tumor was in the right  upper outer quadrant and there was a nice skin crease in between the  right axilla and the tumor that I felt I can get both the right axillary  contents through and the right breast mass through.  The incision was  made in skin crease after infiltrating this with 0.25% Sensorcaine  local.  We then dissected down toward the tumor.  Localizing wire was  encountered and brought this off the incision and then excised the  entire area with the wire intact.  Radiograph revealed the specimen clip  and wire to be intact within this and the margins were felt to be good.  I inspected the cavity and used cautery for good hemostasis.  Next, a  right axillary lymph node  dissection was done.  Through the same  incision, I was able to enter the right axilla.  We then identified the  axillary vein, the thoracodorsal trunk, and the long thoracic nerves.  All lympho fatty tissue in between these landmarks was taken down.  Small lymphatics were ligated with small clips as well as small vessels.  The first intercostal brachial nerve was taken with the right axillary  contents as well.  Once all contents were removed, I inspected the  cavity for hemostasis.  The right axillary vein, thoracodorsal trunk,  and long thoracic nerve pedicles were all intact and appeared to be  functioning.  I used pickups to touch the 2 nerves, the long thoracic  and thoracodorsal, and both muscle groups of approximately responded.  Irrigation was used and suctioned out.  Surgicel was then placed in the  axilla.  I then through a separate stab incision placed a 19-Blake  drain.  I then closed the incision with a deep layer of 3-0 Vicryl and  subsequent 4-0 Monocryl stitch.  Dermabond was  placed on both incisions.  All final counts of sponge, needle, and instruments were found to be  correct at this portion of the case.  The patient was awoke and taken to  the recovery room in satisfactory condition.      Thomas A. Cornett, M.D.  Electronically Signed     TAC/MEDQ  D:  09/13/2008  T:  09/14/2008  Job:  [id number]   cc:   Ladona Horns. Mariel Sleet, MD

## 2010-11-05 NOTE — Assessment & Plan Note (Signed)
[facility]                            CARDIOLOGY OFFICE NOTE   NAME:Erika Ramos, Erika Ramos                         MRN:          [id number]  DATE:02/19/2007                            DOB:          02/15/1946    Ms Wickstrom returns today for further management for coronary artery  disease.   PROBLEM LIST:  1. Coronary artery disease status post right coronary artery stent      February 2001.  1A.  Non-obstructive coronary disease otherwise.  1B.  Normal left ventricular function.  1. Hyperlipidemia.  2. Hypertension followed by Dr. Nobie Putnam.   She is having no angina or ischemia.   Her weight has drifted up.  Now she weighs 218.  She has a medicine that  now is over-the-counter that Dr. Nobie Putnam has recommended called Alli.  This is for reducing fat absorption.   She is currently on aspirin 325 a day, estropipate 1.5 mg per day,  omeprazole 20 mg a day, Altace 10 mg a day, Vytorin 10/20 daily.   PHYSICAL EXAMINATION:  Blood pressure 120/70, pulse 84 and regular,  weight is 218.  She is very pleasant.  HEENT:  PERRLA.  Extraocular movements are intact.  Sclerae clear.  Facial asymmetry is normal.  Carotids are full.  There are no carotid  bruits.  Thyroid is no enlarged.  Trachea is midline.  LUNGS:  Clear.  HEART:  Reveals a regular rate and rhythm.  BMI is poorly appreciated.  ABDOMINAL EXAMINATION:  Soft.  Good bowel sounds.  No midline bruit.  EXTREMITIES:  Reveal no clubbing, cyanosis or edema.  Pulses are intact.  NEUROLOGICAL EXAMINATION:  Intact.   Electrocardiogram today is normal except for a left axis deviation.  There has been no change.   ASSESSMENT AND PLAN:  Ms Maggio is doing well.  I have asked her to  continue current medications and follow closely with Dr. Nobie Putnam for  risk factor modification.  I have encouraged her to lose weight when she  returns from her beach vacation, which she is leaving for today.  I will  see her back in  a year.  At that time she will have a stress Myoview.    Thomas C. Daleen Squibb, MD, [facility]  Electronically Signed   TCW/MedQ  DD: 02/19/2007  DT: 02/21/2007  Job #: [id number]   cc:   Patrica Duel, Ramos.D.

## 2010-11-05 NOTE — Op Note (Signed)
NAMEMarquise, Ramos ANN                  ACCOUNT NO.:  [id number]   MEDICAL RECORD NO.:  [id number]          PATIENT TYPE:  AMB   LOCATION:  DSC                          FACILITY:  [facility]   PHYSICIAN:  Erika Ramos, M.D.DATE OF BIRTH:  [birth year]   DATE OF PROCEDURE:  05/11/2008  DATE OF DISCHARGE:                               OPERATIVE REPORT   PREOPERATIVE DIAGNOSIS:  T2, N1, Mx right breast cancer.   POSTOPERATIVE DIAGNOSIS:  T2, N1, Mx right breast cancer.   PROCEDURE:  1. Attempted placement of right subclavian Port-A-Cath.  2. Placement of right internal jugular 8-French Power Port catheter      with ultrasound guidance and fluoroscopy.   SURGEON:  Erika Fus A. Cornett, MD   ANESTHESIA:  MAC using 0.25% Sensorcaine local.   ESTIMATED BLOOD LOSS:  50 mL.   DRAINS:  None.   INDICATIONS FOR PROCEDURE:  The patient is a 65 year old female with a  T2, N1, Mx breast cancer.  She is in need of neoadjuvant chemotherapy  and is here today for Port-A-Cath placement.  Informed consent was  obtained in the office with the patient.  I also reviewed with her today  here in the holding area to include complications of bleeding,  infection, pneumothorax, hemothorax, pericardial tamponade, injury to  mediastinal structures, and injury to structures in the neck.  She  understood the above and agreed to proceed.   DESCRIPTION OF PROCEDURE:  The patient was brought to the operating  room, placed supine.  Both arms were tucked.  After adequate MAC  anesthesia was initiated, the upper chest and neck region was prepped  and draped in a sterile fashion bilaterally.  The patient was placed in  Trendelenburg.  A 0.25% Sensorcaine was infiltrated around the right  clavicle.  We then used a needle to cannulate the right subclavian vein.  This was somewhat difficult.  We had blood return, but the wire would  not feed through the needle.  Four attempts were made to try to  cannulate the right  subclavian vein and again we could get blood return,  but could not get the wire to feed.  I felt that an attempt at the right  internal jugular might be easier.  Ultrasound was also performed in the  room at this point.  Next, we visualized the right neck and I was able  to find the carotid artery as well as the internal jugular vein.  Using  ultrasound guidance, I was able to cannulate the right internal jugular  vein much easier and get the wire to feed down this without difficulty.  Fluoroscopy was then used and showed the wire going down the superior  vena cava actually down into the inferior vena cava.  Once I did this, I  made a small skin incision in the neck and dilated the area with a  hemostat.  Next, local anesthesia was infiltrated into the right  clavicle again and a 4-cm incision was made to create a pocket for the  actual port.  We dissected down below the clavicle until we  got down to  the peripectoral fascia.  I used my finger to create a small pocket.  Next, an 8-French power port was brought into the field.  I attached the  catheter to the port and secured this myself.  I then flushed the port  out and it flushed quite easily.  A tunneling device was then used to  tunnel the catheter from the lower incision to the upper incision where  the wire exited.  We then put the port in the cavity created for.  I  then cut the catheter to be proximally 15 cm in length from where the  wire inserted into the right internal jugular vein.  With the patient in  Trendelenburg, we then advanced the dilator introducer complex over the  wire carefully moving the wire to-and-fro as I advanced this into the  right internal jugular vein.  This went easily.  I then removed the  dilator and wire and left the introducer in place.  The catheter was  then placed into the peel-away sheath and the peel-away sheath was  peeled away.  Fluoroscopy showed the catheter to be in the distal  superior vena  cava at the cavoatrial junction.  I secured the port to  the chest wall with 2-0 Prolene.  We then drew back from the port and  drew back easily.  I flushed it then with 10 mL of dilute heparinized  saline and then placed 5 mL of 100 units/mL heparinized saline in the  port.  Fluoroscopy showed the tip to be at the junction of the right  atrium and superior vena cava.  We then at this point flattened the  patient.  I closed the incisions in layers using a deep layer of 3-0  Vicryl and a subsequent 4-0 Monocryl.  Dermabond was applied to both  incisions.  All final counts of sponge, needle, and instruments found to  be correct.  The patient was awoke, taken to recovery where chest x-ray  will be obtained in stable condition.      Erika Ramos, M.D.  Electronically Signed     TAC/MEDQ  D:  05/11/2008  T:  05/12/2008  Job:  [id number]   cc:   [facility]

## 2010-11-08 NOTE — Assessment & Plan Note (Signed)
[facility]                              CARDIOLOGY OFFICE NOTE   NAME:Erika Ramos, Erika Ramos                         MRN:          [id number]  DATE:01/06/2006                            DOB:          10-Apr-1946    Erika Ramos returns today for further management of her coronary artery  disease.  I last saw her in 2002.  At that time we performed an exercise  rest stress Cardiolite, which showed no ischemia, with normal left  ventricular function, EF of 78%.  Her exercise tolerance was reasonable.   She has had previous coronary disease, with a right coronary artery  intervention.  She had residual 50% to 60% stenosis in the proximal to mid  LAD, and a 30% in the circumflex.  EF was 60%.   She has hyperlipidemia and hypertension, which are being followed by Dr.  Nobie Putnam.   She has no symptoms of angina.  Her initial presentation was chest burning.   MEDICATIONS:  1.  Aspirin 325 a day.  2.  Estrogen 1.5 mg in the form of estropipate 1.5 mg a day.  3.  Omeprazole 20 mg a day.  4.  Altace 10 mg a day.  5.  Vytorin 10/20 daily.   PHYSICAL EXAMINATION:  Her blood pressure today is 126/72, her pulse is 79  and regular.  Weight is 216.  She is in no acute distress.  Carotids are full without JVD, thyroid is not enlarged.  Trachea is midline.  LUNGS:  Clear.  HEART:  Reveals a regular rate and rhythm.  ABDOMEN:  Soft with good bowel sounds.  EXTREMITIES:  Reveal no edema.  Pulses are brisk.   EKG demonstrates normal sinus rhythm and a normal EKG.   ASSESSMENT:  Erika Ramos is doing well.  However, she is way overdue a  surveillance perfusion study.  I explained to her today that many people  have infarcts with known coronary disease without warning.   PLAN:  1.  A rest/exercise stress Myoview.  2.  Continue to encourage therapeutic lifestyle changes.  3.  Continue preventative secondary prevention, as being emphasized by Dr.      Nobie Putnam.   Assuming  this is negative, we will see her back again in a year, or no more  than 2 years.                               Thomas C. Daleen Squibb, MD, [facility]    TCW/MedQ  DD:  01/06/2006  DT:  01/07/2006  Job #:  [id number]   cc:   Patrica Duel, MD

## 2010-11-08 NOTE — Cardiovascular Report (Signed)
[place]  Patient:    Erika Ramos, Erika Ramos                           MRN: [id number] Proc. Date: 08/08/99 Adm. Date:  [id number] Attending:  Daisey Must CC:         Jesse Sans. Wall, M.D. [facility]             Patrica Duel, M.D.             Cardiac Catheterization Laboratory                        Cardiac Catheterization  PROCEDURES PERFORMED: 1. Left heart catheterization with coronary angiography and left    ventriculography. 2. Percutaneous transluminal coronary angioplasty with stent placement in    the distal right coronary artery. 3. Percutaneous transluminal coronary angioplasty of the posterior descending    artery.  INDICATIONS:  Ms. Casavant is a 65 year old woman with hyperlipidemia and family history of coronary artery disease.  She presented with chest pain and a stress  Cardiolite which showed significant inferolateral and posterolateral ischemia.   CATHETERIZATION PROCEDURE:  A 6 French sheath was placed in the right femoral artery.  Standard Judkins 6 French catheters were utilized.  Contrast was Omnipaque.  There were no complications.  RESULTS:  HEMODYNAMICS:  Left ventricular pressure 138/16.  Aortic pressure 136/80.  No  aortic valve gradient.  LEFT VENTRICULOGRAM:  Wall motion is normal.  Ejection fraction estimated at 60%. No mitral regurgitation.  CORONARY ARTERIOGRAPHY:  (Right dominant).  Left main:  Normal.  Left anterior descending:  The LAD has a 30% stenosis in the proximal vessel near its origin followed by a 60% stenosis in the proximal vessel.  The mid vessel has a 50% stenosis just before a large second diagonal branch.  There is a small first diagonal and a large second diagonal which has a diffuse 20%.  The distal LAD beyond the second diagonal has a diffuse 25% stenosis.  Left circumflex:  The left circumflex has a diffuse 30% stenosis extending from the mid to distal vessel.  The left circumflex gives  rise to a small ramus intermedius, small OM-1 and a large branching OM-2.  Right coronary artery:  The right coronary artery is a large dominant vessel. There is a 25% stenosis in the proximal vessel, 25% stenosis in the mid vessel.  The distal vessel has a tubular 95% stenosis with a small filling defect consistent with thrombus right at the bifurcation of a large posterior descending artery. The posterior descending artery itself has an 80% stenosis at its origin.  Of note,  there is some left to right collateral filling of the distal posterior descending artery.  IMPRESSION: 1. Normal left ventricular systolic function. 2. Two-vessel coronary artery disease characterized by critical stenosis    involving the bifurcation of the distal right coronary artery and    posterior descending artery.  There is moderate disease in the left anterior    descending.  The lesion in the distal right coronary appears to be the    culprit.  PLAN:  For percutaneous intervention of the right coronary artery, see below.  PERCUTANEOUS TRANSLUMINAL CORONARY ANGIOPLASTY PROCEDURE:  Following competition of the diagnostic catheterization the 6 French sheath in the right femoral artery as exchanged over a wire for a 7 [place] sheath.  Heparin and Integrilin were administered per protocol.  We used a  7 [place] guiding catheter with side holes.  An ACS BMW wire was advanced into the distal right coronary artery.  An ACS Hi-Torque Floppy wire was advanced into the posterior descending artery.  A 3.0 x 15 mm Quantum Ranger balloon was positioned across the lesion in the distal right coronary and inflated to 12 atmospheres.  We then took a 2.5 x 15 mm CrossSail balloon and positioned it at the origin of the posterior descending artery and performed three inflations at 12, 10 and then 16 atmospheres respectively.  Following this we placed a 3.5 x 15 mm AVE [id number] stent in the distal right coronary  artery extending across the origin of the posterior descending artery.  This stent was deployed at 8 atmospheres.  Prior to stent deployment the floppy wire was pulled back proximally.  After stent deployment the floppy wire was then readvanced and passed through the side struts of the stent into the posterior descending artery.  We then used a 2.5 x 15 mm CrossSail balloon and positioned it across the side strut of the stent into the origin of the posterior descending artery and inflated it to 14 and 12 atmospheres respectively.  Next, we took a 3.5 x 15 mm Quantum Ranger and positioned it through the length of the stent and inflated that to 16 atmospheres.  Finally, the 2.5 x 15 mm CrossSail balloon was readvanced through the side struts of the stent into the posterior descending artery and inflated twice to 16 atmospheres.  Final angiographic images reveal patency of both the right coronary artery and posterior descending artery.  The  distal right coronary stenosis had been reduced from 95% stenosis with thrombus to less than 10% residual with TIMI-3 flow.  The stenosis at the origin of the posterior descending artery was reduced from 80% to residual 40% with TIMI-3 flow.  COMPLICATIONS:  None.  RESULTS:  Successful PTCA with stent placement in the distal right coronary artery reducing a 95% stenosis with thrombus to less than 10% residual with TIMI-3 flow. An 80% stenosis at he origin of the posterior descending artery was reduced to 0% residual with patency preserved of the posterior descending artery and TIMI-3 flow into this vessel.  PLAN:  Integrilin will be continued for 20 hours.  The patient will be treated ith Plavix for four weeks. DD:  08/08/99 TD:  08/08/99 Job: [id number]

## 2010-11-08 NOTE — H&P (Signed)
   Erika Ramos, Erika Ramos                            ACCOUNT NO.:  [id number]   MEDICAL RECORD NO.:  [id number]                   PATIENT TYPE:   LOCATION:                                       FACILITY:  [facility]   PHYSICIAN:  Dalia Heading, M.D.               DATE OF BIRTH:  30-Jan-1946   DATE OF ADMISSION:  DATE OF DISCHARGE:                                HISTORY & PHYSICAL   CHIEF COMPLAINT:  Need for screening colonoscopy.   HISTORY OF PRESENT ILLNESS:  The patient is a 65 year old white female who  is referred for a screening colonoscopy.  She denies any abdominal  complaints.  She has never had a colonoscopy.  She denies any immediate  family history of colon carcinoma.   PAST MEDICAL HISTORY:  Hypertension, high cholesterol levels, coronary  artery disease.   PAST SURGICAL HISTORY:  Hysterectomy, coronary stent placement in the past.   CURRENT MEDICATIONS:  1. One aspirin p.o. q.d.  2. Burton Apley.  3. Altace.  4. Lipitor.   ALLERGIES:  PLAVIX.   REVIEW OF SYSTEMS:  Unremarkable.   PHYSICAL EXAMINATION:  GENERAL:  The patient is a well-developed, well-  nourished white female in no acute distress.  VITAL SIGNS:  She is afebrile and vital signs are stable.  LUNGS:  Clear to auscultation with equal breath sounds bilaterally.  HEART:  Regular rate and rhythm without S3, S4, or murmurs.  ABDOMEN:  Soft, nondistended, nontender.  No hepatosplenomegaly or masses  are noted.  RECTAL:  Deferred to the procedure.   IMPRESSION:  Need for screening colonoscopy.    PLAN:  The patient is scheduled for a colonoscopy on April 28, 2002.  The  risks and benefits of the procedure including bleeding and perforation were  fully explained to the patient, gave informed consent.                                                 Dalia Heading, M.D.    MAJ/MEDQ  D:  04/19/2002  T:  04/19/2002  Job:  [id number]   cc:   Patrica Duel, MD  [address], [address]  [place]  [place]  [address]  Fax: [phone]

## 2011-03-25 LAB — DIFFERENTIAL
Basophils Relative: 1 % (ref 0–1)
Lymphocytes Relative: 37 % (ref 12–46)
Lymphs Abs: 3 10*3/uL (ref 0.7–4.0)
Monocytes Relative: 6 % (ref 3–12)
Neutro Abs: 4.2 10*3/uL (ref 1.7–7.7)
Neutrophils Relative %: 53 % (ref 43–77)

## 2011-03-25 LAB — COMPREHENSIVE METABOLIC PANEL
ALT: 50 U/L — ABNORMAL HIGH (ref 0–35)
AST: 53 U/L — ABNORMAL HIGH (ref 0–37)
Albumin: 4 g/dL (ref 3.5–5.2)
Alkaline Phosphatase: 78 U/L (ref 39–117)
BUN: 13 mg/dL (ref 6–23)
CO2: 26 mEq/L (ref 19–32)
Calcium: 9.2 mg/dL (ref 8.4–10.5)
Chloride: 102 mEq/L (ref 96–112)
Creatinine, Ser: 0.65 mg/dL (ref 0.4–1.2)
GFR calc Af Amer: >60 mL/min (ref >60)
GFR calc non Af Amer: >60 mL/min (ref >60)
Glucose, Bld: 90 mg/dL (ref 70–99)
Potassium: 3.8 mEq/L (ref 3.5–5.1)
Sodium: 135 mEq/L (ref 135–145)
Total Bilirubin: 0.5 mg/dL (ref 0.3–1.2)
Total Protein: 6.8 g/dL (ref 6.0–8.3)

## 2011-03-25 LAB — CBC
HCT: 43.6 % (ref 36.0–46.0)
Hemoglobin: 14.3 g/dL (ref 12.0–15.0)
MCHC: 32.8 g/dL (ref 30.0–36.0)
Platelets: 231 10*3/uL (ref 150–400)
RDW: 13.1 % (ref 11.5–15.5)

## 2011-03-25 LAB — LACTATE DEHYDROGENASE: LDH: 163 U/L (ref 94–250)

## 2011-03-26 LAB — BASIC METABOLIC PANEL
CO2: 27
Calcium: 9.4
GFR calc Af Amer: >60
Potassium: 5.2 — ABNORMAL HIGH
Sodium: 139

## 2011-03-28 ENCOUNTER — Encounter: Payer: Self-pay | Admitting: *Deleted

## 2011-03-28 ENCOUNTER — Emergency Department (HOSPITAL_COMMUNITY)
Admission: EM | Admit: 2011-03-28 | Discharge: 2011-03-28 | Disposition: A | Discharge status: Home / Self Care | Payer: BC Managed Care – PPO | Attending: Emergency Medicine | Admitting: Emergency Medicine

## 2011-03-28 ENCOUNTER — Telehealth: Payer: Self-pay | Admitting: Cardiology

## 2011-03-28 ENCOUNTER — Other Ambulatory Visit: Payer: Self-pay

## 2011-03-28 ENCOUNTER — Emergency Department (HOSPITAL_COMMUNITY): Payer: BC Managed Care – PPO

## 2011-03-28 DIAGNOSIS — R079 Chest pain, unspecified: Secondary | ICD-10-CM | POA: Insufficient documentation

## 2011-03-28 HISTORY — DX: Presence of coronary angioplasty implant and graft: Z95.5

## 2011-03-28 HISTORY — DX: Atherosclerotic heart disease of native coronary artery without angina pectoris: I25.10

## 2011-03-28 LAB — CBC
Hemoglobin: 11.9 g/dL — ABNORMAL LOW (ref 12.0–15.0)
Hemoglobin: 14.4 g/dL (ref 12.0–15.0)
MCHC: 33.2 g/dL (ref 30.0–36.0)
MCV: 83.4 fL (ref 78.0–100.0)
MCV: 83.4 fL (ref 78.0–100.0)
MCV: 86.1 fL (ref 78.0–100.0)
Platelets: 203 10*3/uL (ref 150–400)
RBC: 3.98 MIL/uL (ref 3.87–5.11)
RBC: 4.29 MIL/uL (ref 3.87–5.11)
RBC: 4.46 MIL/uL (ref 3.87–5.11)
RBC: 5.11 MIL/uL (ref 3.87–5.11)
WBC: 2.7 10*3/uL — ABNORMAL LOW (ref 4.0–10.5)
WBC: 6.1 10*3/uL (ref 4.0–10.5)
WBC: 7.6 10*3/uL (ref 4.0–10.5)

## 2011-03-28 LAB — CARDIAC PANEL(CRET KIN+CKTOT+MB+TROPI)
CK, MB: 4.1 ng/mL — ABNORMAL HIGH (ref 0.3–4.0)
CK, MB: 3.9 ng/mL (ref 0.3–4.0)
Relative Index: 2.5 (ref 0.0–2.5)
Total CK: 156 U/L (ref 7–177)
Troponin I: <0.3 ng/mL (ref <0.30)

## 2011-03-28 LAB — DIFFERENTIAL
Basophils Absolute: 0 10*3/uL (ref 0.0–0.1)
Basophils Relative: 0 % (ref 0–1)
Basophils Relative: 0 % (ref 0–1)
Eosinophils Absolute: 0 10*3/uL (ref 0.0–0.7)
Eosinophils Relative: 2 % (ref 0–5)
Lymphocytes Relative: 35 % (ref 12–46)
Lymphocytes Relative: 42 % (ref 12–46)
Lymphs Abs: 1.1 10*3/uL (ref 0.7–4.0)
Lymphs Abs: 1.5 10*3/uL (ref 0.7–4.0)
Lymphs Abs: 2 10*3/uL (ref 0.7–4.0)
Lymphs Abs: 2.6 10*3/uL (ref 0.7–4.0)
Monocytes Relative: 10 % (ref 3–12)
Monocytes Relative: 22 % — ABNORMAL HIGH (ref 3–12)
Neutro Abs: 0.9 10*3/uL — ABNORMAL LOW (ref 1.7–7.7)
Neutro Abs: 3.3 10*3/uL (ref 1.7–7.7)
Neutrophils Relative %: 33 % — ABNORMAL LOW (ref 43–77)
Neutrophils Relative %: 54 % (ref 43–77)
Neutrophils Relative %: 55 % (ref 43–77)
Neutrophils Relative %: 67 % (ref 43–77)

## 2011-03-28 LAB — COMPREHENSIVE METABOLIC PANEL
ALT: 42 U/L — ABNORMAL HIGH (ref 0–35)
AST: 70 U/L — ABNORMAL HIGH (ref 0–37)
Albumin: 4 g/dL (ref 3.5–5.2)
Alkaline Phosphatase: 119 U/L — ABNORMAL HIGH (ref 39–117)
CO2: 26 mEq/L (ref 19–32)
Calcium: 9.1 mg/dL (ref 8.4–10.5)
Chloride: 103 mEq/L (ref 96–112)
GFR calc non Af Amer: >60 mL/min (ref >60)
Glucose, Bld: 115 mg/dL — ABNORMAL HIGH (ref 70–99)
Potassium: 3.8 mEq/L (ref 3.5–5.1)
Sodium: 139 mEq/L (ref 135–145)
Total Bilirubin: 0.5 mg/dL (ref 0.3–1.2)
Total Bilirubin: 0.4 mg/dL (ref 0.3–1.2)
Total Protein: 7 g/dL (ref 6.0–8.3)

## 2011-03-28 LAB — LIPASE, BLOOD: Lipase: 47 U/L (ref 11–59)

## 2011-03-28 LAB — BASIC METABOLIC PANEL
CO2: 27 mEq/L (ref 19–32)
Glucose, Bld: 114 mg/dL — ABNORMAL HIGH (ref 70–99)
Potassium: 4.2 mEq/L (ref 3.5–5.1)
Sodium: 139 mEq/L (ref 135–145)

## 2011-03-28 MED ORDER — KETOROLAC TROMETHAMINE 30 MG/ML IJ SOLN
30.0000 mg | Freq: Once | INTRAMUSCULAR | Status: AC
  Administered 2011-03-28: 30 mg via INTRAVENOUS
  Filled 2011-03-28: qty 1

## 2011-03-28 NOTE — Telephone Encounter (Signed)
C/O heaviness in chest. Took 2 baby ASA.

## 2011-03-28 NOTE — ED Provider Notes (Signed)
History   This chart was scribed for Dr. Weldon Inches by Clarita Crane. The patient was seen in room APA16A/APA16A and the patient's care was started at 1:06PM.  CSN: [id number] Arrival date & time: 03/28/2011 11:53 AM  Chief Complaint  Patient presents with  . Chest Pain    (Consider location/radiation/quality/duration/timing/severity/associated sxs/prior treatment) HPI Erika Ramos is a 65 y.o. female who presents to the Emergency Department after referral from Dr. Daleen Squibb complaining of constant substernal chest heaviness onset alst night and persistent since with associated HA localized to forehead which began this morning. Denies diaphoresis, nausea, SOB. Patient rates the chest heaviness 5/10 currently. Patient states she had a nuclear stress test and cardiac catherization with stent placement performed after January 2012. Patient denies h/o diabetes, ulcer, smoking, etoh use but has h/o CAD and cancer.   Past Medical History  Diagnosis Date  . Coronary artery disease   . Stented coronary artery   . Cancer     Past Surgical History  Procedure Date  . Retinal detachment surgery   . Breast surgery   . Tonsillectomy   . Abdominal hysterectomy     History reviewed. No pertinent family history.  History  Substance Use Topics  . Smoking status: Never Smoker   . Smokeless tobacco: Not on file  . Alcohol Use: No    OB History    Grav Para Term Preterm Abortions TAB SAB Ect Mult Living                  Review of Systems 10 Systems reviewed and are negative for acute change except as noted in the HPI.  Allergies  Clopidogrel bisulfate  Home Medications  No current outpatient prescriptions on file.  BP 130/77  Pulse 74  Temp(Src) 98 F (36.7 C) (Oral)  Resp 20  Ht 5\' 6"  (1.676 m)  Wt 224 lb (101.606 kg)  BMI 36.15 kg/m2  SpO2 98%  Physical Exam  Nursing note and vitals reviewed. Constitutional: She is oriented to person, place, and time. She appears well-developed  and well-nourished. No distress.  HENT:  Head: Normocephalic and atraumatic.  Eyes: EOM are normal. Pupils are equal, round, and reactive to light.  Neck: Neck supple. No tracheal deviation present.  Cardiovascular: Normal rate and regular rhythm.  Exam reveals no friction rub.   No murmur heard. Pulmonary/Chest: Breath sounds normal. No respiratory distress. She has no wheezes. She has no rales.  Abdominal: Soft. She exhibits no distension. There is no tenderness.  Musculoskeletal: Normal range of motion. She exhibits no edema.  Neurological: She is alert and oriented to person, place, and time. No sensory deficit.  Skin: Skin is warm and dry.  Psychiatric: She has a normal mood and affect. Her behavior is normal.    ED Course  Procedures (including critical care time)  Constant, chest pressure since last night.  It has never gone away.  She denies nausea, vomiting, diaphoresis, or shortness of breath, associated with her symptoms. I will order a chest x-ray, cardiac enzymes, and an EKG and treat her pain in the emergency department.  She is artery had aspirin.  Analgesics will be morphine or another narcotic.  I do not believe that she is having in acute coronary syndrome, so I do not think that nitroglycerin is indicated at this time.  DIAGNOSTIC STUDIES:  Date: 03/28/2011  Rate: 79 BPM  Rhythm: normal sinus rhythm  QRS Axis: normal  Intervals: normal  ST/T Wave abnormalities: normal  Conduction Disutrbances:none  Narrative Interpretation:   Old EKG Reviewed: unchanged  3:20 PM The patient still states that she has pressure in her chest.  She denies abdominal pain, and she has no tenderness to palpation.  I explained to her the results of her tests and suggested that we do an ultrasound of her gallbladder.  She understands and agrees.  She does have a headache now, so I will give her Toradol for her headache.   COORDINATION OF CARE:     Labs Reviewed  CBC  DIFFERENTIAL    BASIC METABOLIC PANEL  CARDIAC PANEL(CRET KIN+CKTOT+MB+TROPI)   Dg Chest Portable 1 View  03/28/2011  *RADIOLOGY REPORT*  Clinical Data: Chest pain.  PORTABLE CHEST - 1 VIEW  Comparison: 09/12/2008 [facility].  Findings: Mild cardiomegaly. No infiltrate, congestive heart failure or pneumothorax.  The patient would eventually benefit from two-view chest with better inspiration.  Prior right axillary lymph node dissection.  Aorta is minimally tortuous.  IMPRESSION: Central pulmonary vascular prominence unchanged.  No infiltrate, congestive heart failure or pneumothorax.  Heart appears slightly enlarged.  Please see above.  Original Report Authenticated By: Fuller Canada, M.D.     No diagnosis found.    MDM  Chest pain      I personally performed the services described in this documentation, which was scribed in my presence. The recorded information has been reviewed and considered.   3:33 PM Spoke with Dr. Deretha Emory.  He will assume care and check [place], reeval pt and make final dispo.   Nicholes Stairs, MD 03/28/11 1534

## 2011-03-28 NOTE — Telephone Encounter (Signed)
10/5--pt calling c/o chest heaviness,numbness in left fingers,can't catch breath--has taken baby asa x2 with no relief--spoke with lori [facility]hardt [facility] who states pt should go to [place]--called pt and advised her to go to ED--pt agrees--trish notified--nt

## 2011-03-28 NOTE — ED Notes (Signed)
Pt c/o heaviness in mid chest and tingling in the ends of her fingers since last night. Denies fever, shortness of breath or nausea.

## 2011-03-28 NOTE — ED Provider Notes (Deleted)
History     CSN: [id number] Arrival date & time: 03/28/2011 11:53 AM  Chief Complaint  Patient presents with  . Chest Pain    (Consider location/radiation/quality/duration/timing/severity/associated sxs/prior treatment) HPI  Past Medical History  Diagnosis Date  . Coronary artery disease   . Stented coronary artery   . Cancer     Past Surgical History  Procedure Date  . Retinal detachment surgery   . Breast surgery   . Tonsillectomy   . Abdominal hysterectomy     History reviewed. No pertinent family history.  History  Substance Use Topics  . Smoking status: Never Smoker   . Smokeless tobacco: Not on file  . Alcohol Use: No    OB History    Grav Para Term Preterm Abortions TAB SAB Ect Mult Living                  Review of Systems  Allergies  Clopidogrel bisulfate  Home Medications   Current Outpatient Rx  Name Route Sig Dispense Refill  . ASPIRIN EC 81 MG PO TBEC Oral Take 81 mg by mouth daily.      . CENTRUM SILVER ULTRA WOMENS PO Oral Take 1 tablet by mouth daily.      Marland Kitchen OMEPRAZOLE 20 MG PO CPDR Oral Take 20 mg by mouth daily.      Marland Kitchen RAMIPRIL 10 MG PO CAPS Oral Take 10 mg by mouth daily.      Marland Kitchen SIMVASTATIN 20 MG PO TABS Oral Take 20 mg by mouth at bedtime.        BP 121/61  Pulse 72  Temp(Src) 98 F (36.7 C) (Oral)  Resp 13  Ht 5\' 6"  (1.676 m)  Wt 224 lb (101.606 kg)  BMI 36.15 kg/m2  SpO2 92%  Physical Exam  ED Course  Procedures (including critical care time)  Labs Reviewed  BASIC METABOLIC PANEL - Abnormal; Notable for the following:    Glucose, Bld 114 (*)    GFR calc non Af Amer 90 (*)    All other components within normal limits  CARDIAC PANEL(CRET KIN+CKTOT+MB+TROPI) - Abnormal; Notable for the following:    CK, MB 4.1 (*)    All other components within normal limits  COMPREHENSIVE METABOLIC PANEL - Abnormal; Notable for the following:    AST 70 (*)    ALT 73 (*)    Alkaline Phosphatase 119 (*)    All other components  within normal limits  CBC  DIFFERENTIAL  LIPASE, BLOOD  CARDIAC PANEL(CRET KIN+CKTOT+MB+TROPI)   US Abdomen Complete  03/28/2011  *RADIOLOGY REPORT*  Clinical Data:  Chest pain.  Question cholelithiasis.  High blood pressure.  COMPLETE ABDOMINAL ULTRASOUND  Comparison:  None.  Findings:  Gallbladder:  No gallstones, gallbladder wall thickening, or pericholecystic fluid.  Common bile duct:  4.4 mm.  Liver:  Elongated liver spanning over 20.3 cm.  Increased echogenicity consistent with fatty infiltration with sparing near the gallbladder fossa.  IVC:  Appears normal.  Pancreas:  Small portions of the pancreas not visualized secondary to overlying bowel gas.  Majority of the pancreas is visualized and unremarkable.  Spleen:  8.6 cm.  No focal mass.  Right Kidney:  13.5 cm. No hydronephrosis or renal mass.  Left Kidney:  11.9 cm. No hydronephrosis or renal mass.  Abdominal aorta:  No aneurysm identified.  IMPRESSION: Elongated liver spanning over 20.3 cm with diffuse fatty infiltration.  No gallstones or findings to suggest gallbladder inflammation.  Small portions of the pancreas not  well delineated secondary to overlying bowel gas.  Original Report Authenticated By: Fuller Canada, M.D.   Dg Chest Portable 1 View  03/28/2011  *RADIOLOGY REPORT*  Clinical Data: Chest pain.  PORTABLE CHEST - 1 VIEW  Comparison: 09/12/2008 [facility].  Findings: Mild cardiomegaly. No infiltrate, congestive heart failure or pneumothorax.  The patient would eventually benefit from two-view chest with better inspiration.  Prior right axillary lymph node dissection.  Aorta is minimally tortuous.  IMPRESSION: Central pulmonary vascular prominence unchanged.  No infiltrate, congestive heart failure or pneumothorax.  Heart appears slightly enlarged.  Please see above.  Original Report Authenticated By: Fuller Canada, M.D.     1. Chest pain       MDM  CT SCAN NEGATIVE AND CP WORK UP NEGATIVE. CARDIAC MARKERS X2  NEGATIVE NO GB DZ.   FOLLOW UP WITH HER PCM. FEELING BETTER AT DISCHARGE.         Shelda Jakes, MD 03/28/11 [phone]

## 2011-03-31 ENCOUNTER — Other Ambulatory Visit (HOSPITAL_COMMUNITY): Payer: Self-pay | Admitting: Oncology

## 2011-03-31 DIAGNOSIS — Z9889 Other specified postprocedural states: Secondary | ICD-10-CM

## 2011-03-31 DIAGNOSIS — Z853 Personal history of malignant neoplasm of breast: Secondary | ICD-10-CM

## 2011-04-02 ENCOUNTER — Encounter (INDEPENDENT_AMBULATORY_CARE_PROVIDER_SITE_OTHER): Payer: BC Managed Care – PPO | Admitting: Ophthalmology

## 2011-04-02 DIAGNOSIS — H35379 Puckering of macula, unspecified eye: Secondary | ICD-10-CM

## 2011-04-02 DIAGNOSIS — H2510 Age-related nuclear cataract, unspecified eye: Secondary | ICD-10-CM

## 2011-04-02 DIAGNOSIS — H33309 Unspecified retinal break, unspecified eye: Secondary | ICD-10-CM

## 2011-04-02 DIAGNOSIS — H43819 Vitreous degeneration, unspecified eye: Secondary | ICD-10-CM

## 2011-05-29 ENCOUNTER — Ambulatory Visit
Admission: RE | Admit: 2011-05-29 | Discharge: 2011-05-29 | Disposition: A | Discharge status: Home / Self Care | Payer: BC Managed Care – PPO | Source: Ambulatory Visit | Attending: Oncology | Admitting: Oncology

## 2011-05-29 DIAGNOSIS — Z9889 Other specified postprocedural states: Secondary | ICD-10-CM

## 2011-05-29 DIAGNOSIS — Z853 Personal history of malignant neoplasm of breast: Secondary | ICD-10-CM

## 2011-06-25 ENCOUNTER — Encounter (INDEPENDENT_AMBULATORY_CARE_PROVIDER_SITE_OTHER): Payer: BC Managed Care – PPO | Admitting: Ophthalmology

## 2011-06-25 DIAGNOSIS — H2510 Age-related nuclear cataract, unspecified eye: Secondary | ICD-10-CM

## 2011-06-25 DIAGNOSIS — H35379 Puckering of macula, unspecified eye: Secondary | ICD-10-CM

## 2011-06-25 DIAGNOSIS — H43819 Vitreous degeneration, unspecified eye: Secondary | ICD-10-CM

## 2011-06-25 DIAGNOSIS — H33309 Unspecified retinal break, unspecified eye: Secondary | ICD-10-CM

## 2011-06-25 DIAGNOSIS — H35371 Puckering of macula, right eye: Secondary | ICD-10-CM | POA: Diagnosis present

## 2011-06-25 NOTE — H&P (Signed)
Erika Ramos is an 66 y.o. female.   Chief Complaint: Loss of vision right eye  HPI: Progressive painless vision loss right eye  Past Medical History  Diagnosis Date  . Coronary artery disease   . Stented coronary artery   . Cancer     Past Surgical History  Procedure Date  . Retinal detachment surgery   . Breast surgery   . Tonsillectomy   . Abdominal hysterectomy     No family history on file. Social History:  reports that she has never smoked. She does not have any smokeless tobacco history on file. She reports that she does not drink alcohol or use illicit drugs.  Allergies:  Allergies  Allergen Reactions  . Clopidogrel Bisulfate     REACTION: hives    No current facility-administered medications on file as of .   Medications Prior to Admission  Medication Sig Dispense Refill  . aspirin EC 81 MG tablet Take 81 mg by mouth daily.        . Multiple Vitamins-Minerals (CENTRUM SILVER ULTRA WOMENS PO) Take 1 tablet by mouth daily.        Marland Kitchen omeprazole (PRILOSEC) 20 MG capsule Take 20 mg by mouth daily.        . ramipril (ALTACE) 10 MG capsule Take 10 mg by mouth daily.        . simvastatin (ZOCOR) 20 MG tablet Take 20 mg by mouth at bedtime.          Review of systems otherwise negative  There were no vitals taken for this visit.  Physical exam: Mental status: oriented x3. Eyes: See eye exam associated with this surgical date and scanned into system by scanning center Ears, Nose, Throat: within normal limits Neck: Within Normal limits General: within normal limits Chest: Within normal limits Breast: deferred Heart: Within normal limits Abdomen: Within normal limits GU: deferred Extremities: within normal limits Skin: within normal limits  Assessment/Plan Epiretinal membrane right eye with edema Plan: To [facility] for Pars plana vitrectomy with injection of kenalog right eye  Sherrie George 06/25/2011, 4:57 PM

## 2011-06-26 ENCOUNTER — Encounter (INDEPENDENT_AMBULATORY_CARE_PROVIDER_SITE_OTHER): Payer: BC Managed Care – PPO | Admitting: Ophthalmology

## 2011-06-27 ENCOUNTER — Encounter (HOSPITAL_COMMUNITY): Payer: Self-pay | Admitting: Pharmacist

## 2011-07-02 ENCOUNTER — Encounter (HOSPITAL_COMMUNITY): Payer: Self-pay | Admitting: *Deleted

## 2011-07-02 MED ORDER — CEFAZOLIN SODIUM-DEXTROSE 2-3 GM-% IV SOLR
2.0000 g | INTRAVENOUS | Status: AC
  Administered 2011-07-03: 2 g via INTRAVENOUS
  Filled 2011-07-02: qty 50

## 2011-07-02 MED ORDER — GATIFLOXACIN 0.5 % OP SOLN
1.0000 [drp] | OPHTHALMIC | Status: AC | PRN
  Administered 2011-07-03 (×3): 1 [drp] via OPHTHALMIC
  Filled 2011-07-02: qty 2.5

## 2011-07-02 MED ORDER — TROPICAMIDE 1 % OP SOLN
1.0000 [drp] | OPHTHALMIC | Status: AC | PRN
  Administered 2011-07-03 (×3): 1 [drp] via OPHTHALMIC
  Filled 2011-07-02: qty 3

## 2011-07-02 MED ORDER — CYCLOPENTOLATE HCL 1 % OP SOLN
1.0000 [drp] | OPHTHALMIC | Status: AC | PRN
  Administered 2011-07-03 (×3): 1 [drp] via OPHTHALMIC
  Filled 2011-07-02: qty 2

## 2011-07-02 MED ORDER — PHENYLEPHRINE HCL 2.5 % OP SOLN
1.0000 [drp] | OPHTHALMIC | Status: AC | PRN
  Administered 2011-07-03 (×3): 1 [drp] via OPHTHALMIC
  Filled 2011-07-02: qty 3

## 2011-07-02 NOTE — Progress Notes (Signed)
Do not use Right arm for venipunctures, IV's or BP

## 2011-07-03 ENCOUNTER — Ambulatory Visit (HOSPITAL_COMMUNITY): Payer: BC Managed Care – PPO

## 2011-07-03 ENCOUNTER — Encounter (HOSPITAL_COMMUNITY): Payer: Self-pay | Admitting: Anesthesiology

## 2011-07-03 ENCOUNTER — Encounter (HOSPITAL_COMMUNITY)
Admission: RE | Disposition: A | Discharge status: Home / Self Care | Payer: Self-pay | Source: Ambulatory Visit | Attending: Ophthalmology

## 2011-07-03 ENCOUNTER — Observation Stay (HOSPITAL_COMMUNITY)
Admission: RE | Admit: 2011-07-03 | Discharge: 2011-07-04 | Disposition: A | Discharge status: Home / Self Care | Payer: BC Managed Care – PPO | Source: Ambulatory Visit | Attending: Ophthalmology | Admitting: Ophthalmology

## 2011-07-03 ENCOUNTER — Ambulatory Visit (HOSPITAL_COMMUNITY): Payer: BC Managed Care – PPO | Admitting: Anesthesiology

## 2011-07-03 ENCOUNTER — Encounter (HOSPITAL_COMMUNITY): Payer: Self-pay | Admitting: *Deleted

## 2011-07-03 DIAGNOSIS — H35371 Puckering of macula, right eye: Secondary | ICD-10-CM

## 2011-07-03 DIAGNOSIS — H33309 Unspecified retinal break, unspecified eye: Secondary | ICD-10-CM

## 2011-07-03 DIAGNOSIS — H35379 Puckering of macula, unspecified eye: Secondary | ICD-10-CM

## 2011-07-03 HISTORY — PX: RETINAL DETACHMENT SURGERY: SHX105

## 2011-07-03 HISTORY — PX: PARS PLANA VITRECTOMY: SHX2166

## 2011-07-03 HISTORY — DX: Fatty (change of) liver, not elsewhere classified: K76.0

## 2011-07-03 HISTORY — DX: Gastro-esophageal reflux disease without esophagitis: K21.9

## 2011-07-03 HISTORY — DX: Other specified postprocedural states: Z98.890

## 2011-07-03 HISTORY — DX: Other specified postprocedural states: R11.2

## 2011-07-03 LAB — SURGICAL PCR SCREEN
MRSA, PCR: NEGATIVE
Staphylococcus aureus: NEGATIVE

## 2011-07-03 SURGERY — PARS PLANA VITRECTOMY WITH 25 GAUGE
Anesthesia: General | Site: Eye | Laterality: Right | Wound class: Clean

## 2011-07-03 MED ORDER — TEMAZEPAM 15 MG PO CAPS: 15.0000 mg | ORAL_CAPSULE | Freq: Every evening | ORAL | Status: DC | PRN

## 2011-07-03 MED ORDER — GATIFLOXACIN 0.5 % OP SOLN
1.0000 [drp] | Freq: Four times a day (QID) | OPHTHALMIC | Status: DC
  Filled 2011-07-03: qty 2.5

## 2011-07-03 MED ORDER — BUPIVACAINE HCL 0.75 % IJ SOLN
INTRAMUSCULAR | Status: DC | PRN
  Administered 2011-07-03: 10 mL

## 2011-07-03 MED ORDER — LIDOCAINE HCL (CARDIAC) 20 MG/ML IV SOLN
INTRAVENOUS | Status: DC | PRN
  Administered 2011-07-03: 60 mg via INTRAVENOUS

## 2011-07-03 MED ORDER — ACETAZOLAMIDE SODIUM 500 MG IJ SOLR
500.0000 mg | Freq: Once | INTRAMUSCULAR | Status: AC
  Administered 2011-07-04: 500 mg via INTRAVENOUS
  Filled 2011-07-03: qty 500

## 2011-07-03 MED ORDER — DEXAMETHASONE SODIUM PHOSPHATE 4 MG/ML IJ SOLN
INTRAMUSCULAR | Status: DC | PRN
  Administered 2011-07-03: 4 mg via INTRAVENOUS

## 2011-07-03 MED ORDER — PROVISC 10 MG/ML IO SOLN
INTRAOCULAR | Status: DC | PRN
  Administered 2011-07-03: .85 mL via INTRAOCULAR

## 2011-07-03 MED ORDER — PHENYLEPHRINE HCL 10 MG/ML IJ SOLN
INTRAMUSCULAR | Status: DC | PRN
  Administered 2011-07-03: 40 ug via INTRAVENOUS
  Administered 2011-07-03: 80 ug via INTRAVENOUS
  Administered 2011-07-03: 40 ug via INTRAVENOUS

## 2011-07-03 MED ORDER — GENTAMICIN SULFATE 40 MG/ML IJ SOLN
INTRAMUSCULAR | Status: DC | PRN
  Administered 2011-07-03: 40 mg

## 2011-07-03 MED ORDER — SODIUM CHLORIDE 0.45 % IV SOLN: INTRAVENOUS | Status: DC

## 2011-07-03 MED ORDER — EPINEPHRINE HCL 1 MG/ML IJ SOLN
INTRAMUSCULAR | Status: DC | PRN
  Administered 2011-07-03: .3 mL

## 2011-07-03 MED ORDER — FENTANYL CITRATE 0.05 MG/ML IJ SOLN
INTRAMUSCULAR | Status: DC | PRN
  Administered 2011-07-03: 100 ug via INTRAVENOUS

## 2011-07-03 MED ORDER — ROCURONIUM BROMIDE 100 MG/10ML IV SOLN
INTRAVENOUS | Status: DC | PRN
  Administered 2011-07-03: 40 mg via INTRAVENOUS

## 2011-07-03 MED ORDER — BACITRACIN-POLYMYXIN B 500-10000 UNIT/GM OP OINT
TOPICAL_OINTMENT | OPHTHALMIC | Status: DC | PRN
  Administered 2011-07-03: 1 via OPHTHALMIC

## 2011-07-03 MED ORDER — MORPHINE SULFATE 2 MG/ML IJ SOLN: 1.0000 mg | INTRAMUSCULAR | Status: DC | PRN

## 2011-07-03 MED ORDER — DEXTROSE 5 % IV SOLN
INTRAVENOUS | Status: DC | PRN
  Administered 2011-07-03: 12:00:00 via INTRAVENOUS

## 2011-07-03 MED ORDER — TETRACAINE HCL 0.5 % OP SOLN
1.0000 [drp] | OPHTHALMIC | Status: DC | PRN
  Filled 2011-07-03: qty 2

## 2011-07-03 MED ORDER — MUPIROCIN 2 % EX OINT
TOPICAL_OINTMENT | CUTANEOUS | Status: AC
  Administered 2011-07-03: 1 via NASAL
  Filled 2011-07-03: qty 22

## 2011-07-03 MED ORDER — DEXAMETHASONE SODIUM PHOSPHATE 10 MG/ML IJ SOLN
INTRAMUSCULAR | Status: DC | PRN
  Administered 2011-07-03: 10 mg

## 2011-07-03 MED ORDER — ATROPINE SULFATE 1 % OP SOLN
OPHTHALMIC | Status: DC | PRN
  Administered 2011-07-03: 1 [drp] via OPHTHALMIC

## 2011-07-03 MED ORDER — ACETAMINOPHEN 325 MG PO TABS
325.0000 mg | ORAL_TABLET | ORAL | Status: DC | PRN
  Administered 2011-07-04: 650 mg via ORAL
  Filled 2011-07-03: qty 2

## 2011-07-03 MED ORDER — BSS PLUS IO SOLN
INTRAOCULAR | Status: DC | PRN
  Administered 2011-07-03: 1 via OPHTHALMIC

## 2011-07-03 MED ORDER — DROPERIDOL 2.5 MG/ML IJ SOLN: 0.6250 mg | Freq: Once | INTRAMUSCULAR | Status: DC

## 2011-07-03 MED ORDER — BRIMONIDINE TARTRATE 0.2 % OP SOLN
1.0000 [drp] | Freq: Two times a day (BID) | OPHTHALMIC | Status: DC
  Filled 2011-07-03: qty 5

## 2011-07-03 MED ORDER — NEOSTIGMINE METHYLSULFATE 1 MG/ML IJ SOLN
INTRAMUSCULAR | Status: DC | PRN
  Administered 2011-07-03: 3 mg via INTRAVENOUS

## 2011-07-03 MED ORDER — LACTATED RINGERS IV SOLN
INTRAVENOUS | Status: DC | PRN
  Administered 2011-07-03: 11:00:00 via INTRAVENOUS

## 2011-07-03 MED ORDER — MUPIROCIN 2 % EX OINT
TOPICAL_OINTMENT | Freq: Two times a day (BID) | CUTANEOUS | Status: DC
  Administered 2011-07-03: 1 via NASAL
  Filled 2011-07-03: qty 22

## 2011-07-03 MED ORDER — OXYCODONE-ACETAMINOPHEN 5-325 MG PO TABS: 1.0000 | ORAL_TABLET | ORAL | Status: DC | PRN

## 2011-07-03 MED ORDER — HYDROMORPHONE HCL PF 1 MG/ML IJ SOLN
0.2500 mg | INTRAMUSCULAR | Status: DC | PRN
  Administered 2011-07-03: 0.25 mg via INTRAVENOUS
  Administered 2011-07-03: 0.5 mg via INTRAVENOUS
  Administered 2011-07-03: 0.25 mg via INTRAVENOUS

## 2011-07-03 MED ORDER — PROPOFOL 10 MG/ML IV EMUL
INTRAVENOUS | Status: DC | PRN
  Administered 2011-07-03: 200 mg via INTRAVENOUS

## 2011-07-03 MED ORDER — PREDNISOLONE ACETATE 1 % OP SUSP
1.0000 [drp] | Freq: Four times a day (QID) | OPHTHALMIC | Status: DC
  Filled 2011-07-03: qty 1

## 2011-07-03 MED ORDER — MIDAZOLAM HCL 5 MG/5ML IJ SOLN
INTRAMUSCULAR | Status: DC | PRN
  Administered 2011-07-03: 1 mg via INTRAVENOUS

## 2011-07-03 MED ORDER — BSS IO SOLN
INTRAOCULAR | Status: DC | PRN
  Administered 2011-07-03: 500 mL via INTRAOCULAR

## 2011-07-03 MED ORDER — SODIUM CHLORIDE 0.9 % IJ SOLN
INTRAMUSCULAR | Status: DC | PRN
  Administered 2011-07-03: 10 mL

## 2011-07-03 MED ORDER — ONDANSETRON HCL 4 MG/2ML IJ SOLN: 4.0000 mg | Freq: Once | INTRAMUSCULAR | Status: DC | PRN

## 2011-07-03 MED ORDER — POLYMYXIN B SULFATE 500000 UNITS IJ SOLR
INTRAMUSCULAR | Status: DC | PRN
  Administered 2011-07-03: 1

## 2011-07-03 MED ORDER — DROPERIDOL 2.5 MG/ML IJ SOLN
INTRAMUSCULAR | Status: DC | PRN
  Administered 2011-07-03: 0.625 mg via INTRAVENOUS

## 2011-07-03 MED ORDER — ONDANSETRON HCL 4 MG/2ML IJ SOLN
4.0000 mg | Freq: Four times a day (QID) | INTRAMUSCULAR | Status: DC | PRN
  Administered 2011-07-03: 4 mg via INTRAVENOUS

## 2011-07-03 MED ORDER — HEMOSTATIC AGENTS (NO CHARGE) OPTIME
TOPICAL | Status: DC | PRN
  Administered 2011-07-03: 1 via TOPICAL

## 2011-07-03 MED ORDER — GLYCOPYRROLATE 0.2 MG/ML IJ SOLN
INTRAMUSCULAR | Status: DC | PRN
  Administered 2011-07-03: .4 mg via INTRAVENOUS

## 2011-07-03 MED ORDER — MAGNESIUM HYDROXIDE 400 MG/5ML PO SUSP: 15.0000 mL | Freq: Four times a day (QID) | ORAL | Status: DC | PRN

## 2011-07-03 MED ORDER — LATANOPROST 0.005 % OP SOLN
1.0000 [drp] | Freq: Every day | OPHTHALMIC | Status: DC
  Filled 2011-07-03: qty 2.5

## 2011-07-03 MED ORDER — ONDANSETRON HCL 4 MG/2ML IJ SOLN
INTRAMUSCULAR | Status: DC | PRN
  Administered 2011-07-03 (×2): 4 mg via INTRAVENOUS

## 2011-07-03 SURGICAL SUPPLY — 63 items
APL SRG 3 HI ABS STRL LF PLS (MISCELLANEOUS)
APPLICATOR DR MATTHEWS STRL (MISCELLANEOUS) IMPLANT
BLADE EYE CATARACT 19 1.4 BEAV (BLADE) IMPLANT
BLADE MVR KNIFE 19G (BLADE) IMPLANT
BLADE MVR KNIFE 20G (BLADE) IMPLANT
CANNULA DUAL BORE 23G (CANNULA) IMPLANT
CANNULA FLEX TIP 25G (CANNULA) ×2 IMPLANT
CANNULA IRRIGATING 27GX7/8 (BLADE) IMPLANT
CLOTH BEACON ORANGE TIMEOUT ST (SAFETY) ×2 IMPLANT
CORDS BIPOLAR (ELECTRODE) IMPLANT
COTTONBALL LRG STERILE PKG (GAUZE/BANDAGES/DRESSINGS) ×6 IMPLANT
DRAPE OPHTHALMIC 77X100 STRL (CUSTOM PROCEDURE TRAY) ×2 IMPLANT
EYE SHIELD UNIVERSAL CLEAR (GAUZE/BANDAGES/DRESSINGS) ×2 IMPLANT
FILTER BLUE MILLIPORE (MISCELLANEOUS) IMPLANT
FILTER STRAW FLUID ASPIR (MISCELLANEOUS) ×2 IMPLANT
FLEX TIP ×2 IMPLANT
FORCEPS ECKARDT ILM 25G SERR (OPHTHALMIC RELATED) ×2 IMPLANT
GLOVE OPTIFIT SS 6.5 STRL BRWN (GLOVE) ×2 IMPLANT
GLOVE SS BIOGEL STRL SZ 7 (GLOVE) ×1 IMPLANT
GLOVE SUPERSENSE BIOGEL SZ 7 (GLOVE) ×1
GLOVE SURG 8.5 LATEX PF (GLOVE) ×2 IMPLANT
GOWN STRL NON-REIN LRG LVL3 (GOWN DISPOSABLE) ×6 IMPLANT
ILLUMINATOR CHOW PICK 25GA (MISCELLANEOUS) ×2 IMPLANT
KIT ROOM TURNOVER OR (KITS) ×2 IMPLANT
KNIFE CRESCENT 2.5 55 ANG (BLADE) IMPLANT
LENS BIOM SUPER VIEW SET DISP (OPHTHALMIC RELATED) IMPLANT
MARKER SKIN DUAL TIP RULER LAB (MISCELLANEOUS) ×2 IMPLANT
MICROPICK 25G (MISCELLANEOUS)
NEEDLE 18GX1X1/2 (RX/OR ONLY) (NEEDLE) ×2 IMPLANT
NEEDLE 25GX 5/8IN NON SAFETY (NEEDLE) ×2 IMPLANT
NEEDLE 27GAX1X1/2 (NEEDLE) IMPLANT
NEEDLE BACKFLUSH 1281 A1 (NEEDLE) ×2 IMPLANT
NEEDLE HYPO 30X.5 LL (NEEDLE) ×2 IMPLANT
NS IRRIG 1000ML POUR BTL (IV SOLUTION) ×2 IMPLANT
PACK VITRECTOMY CUSTOM (CUSTOM PROCEDURE TRAY) ×2 IMPLANT
PAD ARMBOARD 7.5X6 YLW CONV (MISCELLANEOUS) ×4 IMPLANT
PAD EYE OVAL STERILE LF (GAUZE/BANDAGES/DRESSINGS) ×2 IMPLANT
PAK VITRECTOMY PIK 25 GA (OPHTHALMIC RELATED) ×2 IMPLANT
PENCIL BIPOLAR 25GA STR DISP (OPHTHALMIC RELATED) IMPLANT
PICK MICROPICK 25G (MISCELLANEOUS) IMPLANT
PROBE DIRECTIONAL LASER (MISCELLANEOUS) IMPLANT
REPL STRA BRUSH NEEDLE (NEEDLE) IMPLANT
RESERVOIR BACK FLUSH (MISCELLANEOUS) IMPLANT
ROLLS DENTAL (MISCELLANEOUS) ×4 IMPLANT
SCRAPER DIAMOND DUST MEMBRANE (MISCELLANEOUS) ×4 IMPLANT
SPONGE SURGIFOAM ABS GEL 12-7 (HEMOSTASIS) ×4 IMPLANT
STOPCOCK 4 WAY LG BORE MALE ST (IV SETS) IMPLANT
SUT CHROMIC 7 0 TG140 8 (SUTURE) IMPLANT
SUT ETHILON 10 0 CS140 6 (SUTURE) IMPLANT
SUT ETHILON 9 0 TG140 8 (SUTURE) IMPLANT
SUT POLY NON ABSORB 10-0 8 STR (SUTURE) IMPLANT
SUT SILK 4 0 RB 1 (SUTURE) IMPLANT
SYR 20CC LL (SYRINGE) ×2 IMPLANT
SYR 50ML LL SCALE MARK (SYRINGE) IMPLANT
SYR 5ML LL (SYRINGE) IMPLANT
SYR BULB 3OZ (MISCELLANEOUS) ×2 IMPLANT
SYR TB 1ML LUER SLIP (SYRINGE) IMPLANT
SYRINGE 10CC LL (SYRINGE) IMPLANT
TAPE TRANSPARENT 1/2IN (GAUZE/BANDAGES/DRESSINGS) ×2 IMPLANT
TOWEL OR 17X24 6PK STRL BLUE (TOWEL DISPOSABLE) ×6 IMPLANT
TROCAR CANNULA 25GA (CANNULA) ×2 IMPLANT
WATER STERILE IRR 1000ML POUR (IV SOLUTION) ×2 IMPLANT
WIPE INSTRUMENT VISIWIPE 73X73 (MISCELLANEOUS) ×2 IMPLANT

## 2011-07-03 NOTE — Anesthesia Postprocedure Evaluation (Signed)
  Anesthesia Post-op Note  Patient: Erika Ramos  Procedure(s) Performed:  PARS PLANA VITRECTOMY WITH 25 GAUGE - MEMBRANE PEEL, HEAD SCOPE LASER, GAS  Patient Location: PACU  Anesthesia Type: General  Level of Consciousness: awake, sedated and patient cooperative  Airway and Oxygen Therapy: Patient Spontanous Breathing and Patient connected to nasal cannula oxygen  Post-op Pain: mild  Post-op Assessment: Post-op Vital signs reviewed, Patient's Cardiovascular Status Stable, Respiratory Function Stable, Patent Airway, No signs of Nausea or vomiting and Pain level controlled  Post-op Vital Signs: Reviewed and stable  Complications: No apparent anesthesia complications

## 2011-07-03 NOTE — Preoperative (Signed)
Beta Blockers   Reason not to administer Beta Blockers:Not Applicable 

## 2011-07-03 NOTE — Anesthesia Procedure Notes (Signed)
Procedure Name: Intubation Date/Time: 07/03/2011 11:48 AM Performed by: Tyrone Nine Pre-anesthesia Checklist: Patient identified, Patient being monitored, Emergency Drugs available and Suction available Patient Re-evaluated:Patient Re-evaluated prior to inductionOxygen Delivery Method: Circle System Utilized Preoxygenation: Pre-oxygenation with 100% oxygen Intubation Type: IV induction Ventilation: Mask ventilation without difficulty Laryngoscope Size: Mac and 3 Grade View: Grade I Tube type: Oral Tube size: 7.5 mm Number of attempts: 1 Airway Equipment and Method: stylet Placement Confirmation: ETT inserted through vocal cords under direct vision,  positive ETCO2 and breath sounds checked- equal and bilateral Secured at: 21 cm Tube secured with: Tape Dental Injury: Teeth and Oropharynx as per pre-operative assessment

## 2011-07-03 NOTE — H&P (Signed)
I examined the patient and there is no change from the previous H & P.

## 2011-07-03 NOTE — Addendum Note (Signed)
Addendum  created 07/03/11 1341 by Rivka Barbara, MD   Modules edited:Orders

## 2011-07-03 NOTE — Anesthesia Preprocedure Evaluation (Addendum)
Anesthesia Evaluation  Patient identified by MRN, date of birth, ID band Patient awake    Reviewed: Allergy & Precautions, H&P , NPO status , Patient's Chart, lab work & pertinent test results  History of Anesthesia Complications (+) PONV and AWARENESS UNDER ANESTHESIA  Airway Mallampati: II TM Distance: >3 FB Neck ROM: Full    Dental  (+) Teeth Intact   Pulmonary shortness of breath and with exertion,    Pulmonary exam normal       Cardiovascular Exercise Tolerance: Poor hypertension, Pt. on medications + CAD and + Cardiac Stents Regular Normal Denies any chest discomfort since placement of cardiac stent.   Neuro/Psych Negative Neurological ROS  Negative Psych ROS   GI/Hepatic GERD-  Controlled,LFT's elevated   Endo/Other  Negative Endocrine ROS  Renal/GU negative Renal ROS  Genitourinary negative   Musculoskeletal negative musculoskeletal ROS (+)   Abdominal   Peds negative pediatric ROS (+)  Hematology negative hematology ROS (+)   Anesthesia Other Findings   Reproductive/Obstetrics negative OB ROS                        Anesthesia Physical Anesthesia Plan  ASA: III  Anesthesia Plan: General   Post-op Pain Management:    Induction: Intravenous  Airway Management Planned: Oral ETT  Additional Equipment:   Intra-op Plan:   Post-operative Plan: Extubation in OR  Informed Consent:   Dental advisory given  Plan Discussed with: CRNA, Anesthesiologist and Surgeon  Anesthesia Plan Comments:        Anesthesia Quick Evaluation

## 2011-07-03 NOTE — Progress Notes (Signed)
Dr. Katrinka Blazing notified of abnormal lab no need to repeat per Dr. Katrinka Blazing

## 2011-07-03 NOTE — Transfer of Care (Signed)
Immediate Anesthesia Transfer of Care Note  Patient: Erika Ramos  Procedure(s) Performed:  PARS PLANA VITRECTOMY WITH 25 GAUGE - MEMBRANE PEEL, HEAD SCOPE LASER, GAS  Patient Location: PACU  Anesthesia Type: General  Level of Consciousness: awake, alert , oriented and patient cooperative  Airway & Oxygen Therapy: Patient Spontanous Breathing and Patient connected to nasal cannula oxygen  Post-op Assessment: Report given to PACU RN and Post -op Vital signs reviewed and stable  Post vital signs: Reviewed and stable Filed Vitals:   07/03/11 0924  BP: 113/82  Pulse: 85  Temp: 36.6 C  Resp: 20    Complications: No apparent anesthesia complications

## 2011-07-03 NOTE — Brief Op Note (Signed)
Brief Operative note   Preoperative diagnosis:  Epiretinal membrane, retinal break Postoperative diagnosis: Epiretinal membrane, retinal break  Procedures: Pars plana vitrectomy Right eye  Surgeon:  Sherrie George, MD...  Assistant:  Rosalie Doctor SA    Anesthesia: General  Specimen: none  Estimated blood loss:  1cc  Complications: none  Patient sent to PACU in good condition  Composed by Sherrie George MD  Dictation [phone]

## 2011-07-04 ENCOUNTER — Encounter (HOSPITAL_COMMUNITY): Payer: Self-pay | Admitting: Ophthalmology

## 2011-07-04 MED ORDER — BACITRACIN-POLYMYXIN B 500-10000 UNIT/GM OP OINT
1.0000 "application " | TOPICAL_OINTMENT | Freq: Three times a day (TID) | OPHTHALMIC | Status: DC
  Filled 2011-07-04: qty 3.5

## 2011-07-04 MED FILL — Polymyxin B Sulfate For Inj 500000 Unit: INTRAMUSCULAR | Qty: 1 | Status: AC

## 2011-07-04 MED FILL — Droperidol Inj 2.5 MG/ML: INTRAMUSCULAR | Qty: 2 | Status: AC

## 2011-07-04 NOTE — Progress Notes (Signed)
07/04/2011, 6:50 AM  Mental Status:  Awake, Alert, Oriented  Anterior segment: Cornea  Clear    Anterior Chamber Clear    Lens:   Cataract  Intra Ocular Pressure 17 mmHg with Tonopen  Vitreous: Clear 80%gas bubble   Retina:  Attached Good laser reaction   Impression: Excellent result Retina attached   Final Diagnosis: Principal Problem:  *Epiretinal membrane, right eye   Plan: start post operative eye drops.  Discharge to home.  Give post operative instructions  Sherrie George 07/04/2011, 6:50 AM

## 2011-07-04 NOTE — Op Note (Signed)
NAMEAMBERLYNN, Erika Ramos                  ACCOUNT NO.:  [id number]  MEDICAL RECORD NO.:  [id number]  LOCATION:  5151                         FACILITY:  [facility]  PHYSICIAN:  Beulah Gandy. Ashley Royalty, M.D. DATE OF BIRTH:  1946-01-17  DATE OF PROCEDURE:  07/03/2011 DATE OF DISCHARGE:                              OPERATIVE REPORT   ADMISSION DIAGNOSIS:  Preretinal fibrosis and peripheral retinal break, right eye.  PROCEDURES:  Retinal photocoagulation, pars plana vitrectomy, membrane peel, gas-fluid exchange, right eye.  SURGEON:  Beulah Gandy. Ashley Royalty, MD  ASSISTANT:  Rosalie Doctor, SA  ANESTHESIA:  General.  DETAILS:  Usual prep and drape, the indirect ophthalmoscope laser was moved into place and 551 burns were placed around the retinal periphery. There was a large horseshoe tear at 10 o'clock at the ora.  The laser was used to surround this lesion as well as 2 rows of laser in the periphery.  The 25-gauge trocars were placed at 8, 10, and 2 o'clock. The contact lens ring was anchored into place at 6 and 12 o'clock. Provisc placed on the corneal surface.  The flat contact lens was placed.  Pars plana vitrectomy was begun just behind the cataractous lens.  The vitrectomy was carried posteriorly and down to the macular surface where a severely distorted macula was seen.  The macula had a Y- shaped crease in it, and there was a large nodule of vitreous attached to the crease and covering the macular region.  The flat contact lens and the magnifying contact lens were then used along with the vitreous cutter, vitreous pick, the silicone tip suction line, vitreous forceps and diamond-dusted membrane scraper to unroof the crease and remove the multiple layers of preretinal fibrosis.  Once all the layers were removed, a few tiny pinpoint hemorrhages were seen around the foveal region, but not in the fovea.  The crease opened and the retina began to lie flat.  The 30-degree prismatic lens was positioned and  the mid and peripheral vitreous vitrectomy was performed.  Some fibrosis was seen nasally.  This was trimmed down to the retinal surface.  The horseshoe tear was removed with the vitreous cutter and all traction around the tear.  No vitreal retinal traction was remaining.  Once the entire vitreous was removed, the periphery was inspected and there were no other tears found.  A 100% gas-fluid exchange was then performed.  The silicone tip suction line was used to remove fluid over the optic disc until the complete fill was accomplished.  Sterile room air was used. The instruments were removed from the eye and Q-tip was used to hold the wounds until they were airtight and watertight.  The instruments were removed from the eye.  The contact lens was removed.  Polymyxin and gentamicin were irrigated into tenon space.  Atropine solution was applied.  Marcaine was injected around the globe for postop pain. Decadron 10 mg was injected into the lower subconjunctival space.  The closing pressure was 15 with a Barraquer tonometer.  Polysporin and patch and shield were placed.  The patient was awakened and taken to recovery in satisfactory condition.  COMPLICATIONS:  None.  DURATION:  1  hour.     Beulah Gandy. Ashley Royalty, M.D.     JDM/MEDQ  D:  07/03/2011  T:  07/04/2011  Job:  [id number]

## 2011-07-04 NOTE — Discharge Planning (Signed)
DISCHARGED AMB WITH ALL PERSONAL BELONGINGS ACOMP. BY FRIEND TO PRIVATE CAR HOME.  GIVEN COPY OF DC INSTRUCTIONS, EYE BAG WITH MEDS. PT VERBALIZES UNDERSTANDING.

## 2011-07-04 NOTE — Discharge Summary (Signed)
No DC summ needed OWER PT

## 2011-07-10 ENCOUNTER — Inpatient Hospital Stay (INDEPENDENT_AMBULATORY_CARE_PROVIDER_SITE_OTHER): Payer: BC Managed Care – PPO | Admitting: Ophthalmology

## 2011-07-10 DIAGNOSIS — H35379 Puckering of macula, unspecified eye: Secondary | ICD-10-CM

## 2011-07-18 ENCOUNTER — Encounter (HOSPITAL_COMMUNITY): Payer: Self-pay | Admitting: Oncology

## 2011-07-18 ENCOUNTER — Encounter (HOSPITAL_COMMUNITY): Payer: BC Managed Care – PPO | Attending: Oncology | Admitting: Oncology

## 2011-07-18 VITALS — BP 135/85 | HR 84 | Temp 98.0°F | Wt 222.4 lb

## 2011-07-18 DIAGNOSIS — C50419 Malignant neoplasm of upper-outer quadrant of unspecified female breast: Secondary | ICD-10-CM

## 2011-07-18 DIAGNOSIS — C50919 Malignant neoplasm of unspecified site of unspecified female breast: Secondary | ICD-10-CM

## 2011-07-18 DIAGNOSIS — E669 Obesity, unspecified: Secondary | ICD-10-CM

## 2011-07-18 DIAGNOSIS — L989 Disorder of the skin and subcutaneous tissue, unspecified: Secondary | ICD-10-CM

## 2011-07-18 NOTE — Progress Notes (Signed)
This office note has been dictated.

## 2011-07-18 NOTE — Patient Instructions (Signed)
[facility]  Discharge Instructions  RECOMMENDATIONS MADE BY THE CONSULTANT AND ANY TEST RESULTS WILL BE SENT TO YOUR REFERRING DOCTOR.   EXAM FINDINGS BY MD TODAY AND SIGNS AND SYMPTOMS TO REPORT TO CLINIC OR PRIMARY MD:  Exam good. Return every 6 months. Report any new lumps.  SPECIAL INSTRUCTIONS/FOLLOW-UP: We will get labs from [facility].   I acknowledge that I have been informed and understand all the instructions given to me and received a copy. I do not have any more questions at this time, but understand that I may call the Specialty [facility] at [facility] at [phone] during business hours should I have any further questions or need assistance in obtaining follow-up care.    __________________________________________  _____________  __________ Signature of Patient or Authorized Representative            Date                   Time    __________________________________________ Nurse's Signature

## 2011-07-18 NOTE — Progress Notes (Signed)
CC:   Erika Ramos, M.D. Thomas A. Cornett, M.D. Lurline Hare, M.D.  DIAGNOSES: 1. 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  She had a triple     negative breast cancer.  Ki-67 marker was 55% and she received dose     dense FEC x4 cycles followed by Taxotere x4 cycles in a dose dense     fashion.  She went on to have surgery on 09/13/2008 finding a     "radial scar only".  She had 23 of 23 negative nodes followed by     radiation therapy after surgery and she is here for followup. 2. Left eye operation by Dr. Alan Mulder on 07/03/2011 consisting of     a retinal photocoagulation and pars plana vitrectomy and membrane     peel with gas fluid exchange of the right eye. 3. History of vitiligo. 4. Right wrist repair in 2007. 5. Obesity. 6. GI workup for blood in her stools in the past which showed some     internal hemorrhoids, multiple gastric polyps but they were benign,     a sessile descending colon polyp that was benign and some internal     hemorrhoids.  That was done in June 2011 by Dr. Roetta Sessions. 7. Herpes zoster 2007. 8. History of a goiter. 9. History of obesity. 10.Left facial skin lesion with pearly borders that I have asked her     to see Dr. Nita Sells about this.  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  It is about 3-4 mm across about 1.5 cm to the left of     the base of the nose. 11.Plavix induced urticaria and oxycodone induced nausea and vomiting. 12.Complete hysterectomy in 1992 for benign disease followed by the     use of hormone therapy for several years until 05/02/2008 one week     prior to her presentation. Dewayne Hatch is still working full time, but her right eye got into trouble in June with these visual changes.  She is doing as well as we can expect from that, but is going to see Dr. Ashley Royalty again in about 2  weeks.  She may need a change in her corrective lenses, but she states that her vision is better but she had a problem with this in June, went to see a doctor in [place].  It sounds like she formed too much scar tissue after the procedure.  Nevertheless she is here today.  Her oncologic review of systems is negative.  She had blood work by Dr. Lamar Blinks office a couple of weeks ago, so will get those values.  She is here today without a fever, chills or night sweats.  No lumps anywhere, etc.  Weight is 222 pounds which is up 10 pounds compared to May 2011.  Blood pressure is 135/85 left arm sitting position.  Pulse 80 and regular.  Respirations 16-18 and unlabored.  She is afebrile.  She denies any pain.  The skin lesion on the left side of the face near the nose has been described.  Her back is unremarkable.  Lungs are clear. She has no adenopathy in any location.  Both breasts are negative for any masses.  The scars on the right breast have healed very well.  She has  a normal heart exam.  Abdomen is soft and nontender without organomegaly or masses.  Bowel sounds are normal.  She has no peripheral edema.  So we will see her back in 6 months.  I think with the conversion to the new computer system, she did not make her appointment in July and so we had missed her at some point.  What I have told her today is that we need to see her every 6 months until she is out 5 years from the completion of chemotherapy, which will be March of 2015.    ______________________________ Ladona Horns. Mariel Sleet, MD ESN/MEDQ  D:  07/18/2011  T:  07/18/2011  Job:  [id number]

## 2011-07-31 ENCOUNTER — Ambulatory Visit (INDEPENDENT_AMBULATORY_CARE_PROVIDER_SITE_OTHER): Payer: BC Managed Care – PPO | Admitting: Ophthalmology

## 2011-07-31 DIAGNOSIS — H35379 Puckering of macula, unspecified eye: Secondary | ICD-10-CM

## 2011-08-28 ENCOUNTER — Encounter (INDEPENDENT_AMBULATORY_CARE_PROVIDER_SITE_OTHER): Payer: BC Managed Care – PPO | Admitting: Ophthalmology

## 2011-08-28 DIAGNOSIS — H35379 Puckering of macula, unspecified eye: Secondary | ICD-10-CM

## 2011-11-06 ENCOUNTER — Encounter (INDEPENDENT_AMBULATORY_CARE_PROVIDER_SITE_OTHER): Payer: BC Managed Care – PPO | Admitting: Ophthalmology

## 2011-11-06 DIAGNOSIS — H2510 Age-related nuclear cataract, unspecified eye: Secondary | ICD-10-CM

## 2011-11-06 DIAGNOSIS — H35379 Puckering of macula, unspecified eye: Secondary | ICD-10-CM

## 2011-11-06 DIAGNOSIS — H43819 Vitreous degeneration, unspecified eye: Secondary | ICD-10-CM

## 2011-11-06 DIAGNOSIS — H33309 Unspecified retinal break, unspecified eye: Secondary | ICD-10-CM

## 2012-01-16 ENCOUNTER — Encounter (HOSPITAL_COMMUNITY): Payer: BC Managed Care – PPO | Attending: Oncology | Admitting: Oncology

## 2012-01-16 VITALS — BP 136/90 | HR 86 | Temp 96.7°F | Ht 66.0 in | Wt 217.8 lb

## 2012-01-16 DIAGNOSIS — C50419 Malignant neoplasm of upper-outer quadrant of unspecified female breast: Secondary | ICD-10-CM

## 2012-01-16 DIAGNOSIS — C50919 Malignant neoplasm of unspecified site of unspecified female breast: Secondary | ICD-10-CM

## 2012-01-16 NOTE — Patient Instructions (Signed)
[facility]  Discharge Instructions Erika Ramos  [id number] [birth year] Dr. Glenford Peers  RECOMMENDATIONS MADE BY THE CONSULTANT AND ANY TEST RESULTS WILL BE SENT TO YOUR REFERRING DOCTOR.   EXAM FINDINGS BY MD TODAY AND SIGNS AND SYMPTOMS TO REPORT TO CLINIC OR PRIMARY MD:  Exam good  MEDICATIONS PRESCRIBED:  Switch over your meds and take in the morning  INSTRUCTIONS GIVEN AND DISCUSSED: Have your doctor send [place] a copy of labs  SPECIAL INSTRUCTIONS/FOLLOW-UP: Return to Clinic in 6 months   I acknowledge that I have been informed and understand all the instructions given to me and received a copy. I do not have any more questions at this time, but understand that I may call the Specialty Clinic at [facility] at [phone] during business hours should I have any further questions or need assistance in obtaining follow-up care.    __________________________________________  _____________  __________ Signature of Patient or Authorized Representative            Date                   Time    __________________________________________ Nurse's Signature

## 2012-01-16 NOTE — Progress Notes (Signed)
Problem 1 stage II infiltrating ductal carcinoma the right breast presenting with a mass in the right breast clinically positive lymph node in the right axilla triple negative breast cancer Ki-67 marker high at 55%, status post FEC x4 in a dose dense fashion followed by docetaxel x4 in a dose dense fashion followed by surgery on 09/13/2008 finding a radial scar only and 23 of 23 nodes were negative and then she was treated with radiation therapy. There's been no evidence recurrent disease. Her oncologic review of systems is negative. She is working part-time and doing quite well.  Vital signs are stable lymph nodes are negative including cervical supraclavicular infraclavicular axillary and inguinal areas. Her vitiligo is very obvious that she thinks is clearly getting worse. She has no thyromegaly. Lungs are clear. Heart shows a regular rhythm and rate without murmur rub or gallop. Abdomen is soft obese without organomegaly. Bowel sounds are normal. She has a little puffiness pretibially in both legs. She has no arm edema both breasts are negative for any masses and her scars are well-healed in the right breast and axillary area.  She looks great we will see her back in 6 months. She thinks she is due for blood work at her primary care physician's office next week and will get [place] a copy. She will see [place] sooner if need be.

## 2012-05-13 ENCOUNTER — Ambulatory Visit (INDEPENDENT_AMBULATORY_CARE_PROVIDER_SITE_OTHER): Payer: BC Managed Care – PPO | Admitting: Ophthalmology

## 2012-05-13 DIAGNOSIS — H2510 Age-related nuclear cataract, unspecified eye: Secondary | ICD-10-CM

## 2012-05-13 DIAGNOSIS — I10 Essential (primary) hypertension: Secondary | ICD-10-CM

## 2012-05-13 DIAGNOSIS — H33309 Unspecified retinal break, unspecified eye: Secondary | ICD-10-CM

## 2012-05-13 DIAGNOSIS — H35039 Hypertensive retinopathy, unspecified eye: Secondary | ICD-10-CM

## 2012-05-13 DIAGNOSIS — H43819 Vitreous degeneration, unspecified eye: Secondary | ICD-10-CM

## 2012-05-13 DIAGNOSIS — H35379 Puckering of macula, unspecified eye: Secondary | ICD-10-CM

## 2012-06-24 ENCOUNTER — Other Ambulatory Visit (HOSPITAL_COMMUNITY): Payer: Self-pay | Admitting: Oncology

## 2012-06-24 DIAGNOSIS — Z853 Personal history of malignant neoplasm of breast: Secondary | ICD-10-CM

## 2012-06-25 ENCOUNTER — Other Ambulatory Visit: Payer: Self-pay | Admitting: Surgical Oncology

## 2012-06-29 ENCOUNTER — Ambulatory Visit
Admission: RE | Admit: 2012-06-29 | Discharge: 2012-06-29 | Disposition: A | Discharge status: Home / Self Care | Payer: BC Managed Care – PPO | Source: Ambulatory Visit | Attending: Oncology | Admitting: Oncology

## 2012-06-29 DIAGNOSIS — Z853 Personal history of malignant neoplasm of breast: Secondary | ICD-10-CM

## 2012-07-16 ENCOUNTER — Encounter (HOSPITAL_COMMUNITY): Payer: BC Managed Care – PPO | Attending: Oncology | Admitting: Oncology

## 2012-07-16 ENCOUNTER — Encounter (HOSPITAL_COMMUNITY): Payer: Self-pay | Admitting: Oncology

## 2012-07-16 VITALS — BP 147/77 | HR 72 | Temp 97.6°F | Resp 20 | Wt 212.6 lb

## 2012-07-16 DIAGNOSIS — C50919 Malignant neoplasm of unspecified site of unspecified female breast: Secondary | ICD-10-CM

## 2012-07-16 DIAGNOSIS — L989 Disorder of the skin and subcutaneous tissue, unspecified: Secondary | ICD-10-CM

## 2012-07-16 DIAGNOSIS — Z853 Personal history of malignant neoplasm of breast: Secondary | ICD-10-CM

## 2012-07-16 DIAGNOSIS — Z09 Encounter for follow-up examination after completed treatment for conditions other than malignant neoplasm: Secondary | ICD-10-CM | POA: Insufficient documentation

## 2012-07-16 LAB — COMPREHENSIVE METABOLIC PANEL
ALT: 51 U/L — ABNORMAL HIGH (ref 0–35)
AST: 49 U/L — ABNORMAL HIGH (ref 0–37)
Albumin: 4 g/dL (ref 3.5–5.2)
Calcium: 9.5 mg/dL (ref 8.4–10.5)
GFR calc Af Amer: >90 mL/min (ref >90)
Glucose, Bld: 101 mg/dL — ABNORMAL HIGH (ref 70–99)
Sodium: 138 mEq/L (ref 135–145)
Total Protein: 7.6 g/dL (ref 6.0–8.3)

## 2012-07-16 LAB — CBC WITH DIFFERENTIAL/PLATELET
Basophils Absolute: 0 10*3/uL (ref 0.0–0.1)
Basophils Relative: 0 % (ref 0–1)
Eosinophils Absolute: 0.2 10*3/uL (ref 0.0–0.7)
Eosinophils Relative: 3 % (ref 0–5)
MCH: 28.3 pg (ref 26.0–34.0)
MCHC: 33.3 g/dL (ref 30.0–36.0)
MCV: 85 fL (ref 78.0–100.0)
Neutrophils Relative %: 54 % (ref 43–77)
Platelets: 235 10*3/uL (ref 150–400)
RDW: 12.8 % (ref 11.5–15.5)

## 2012-07-16 NOTE — Patient Instructions (Addendum)
[facility] Discharge Instructions  RECOMMENDATIONS MADE BY THE CONSULTANT AND ANY TEST RESULTS WILL BE SENT TO YOUR REFERRING PHYSICIAN.  EXAM FINDINGS BY THE PHYSICIAN TODAY AND SIGNS OR SYMPTOMS TO REPORT TO CLINIC OR PRIMARY PHYSICIAN: exam and discussion by MD.  See Dr. Margo Aye about the areas on your chest.  MEDICATIONS PRESCRIBED:  none  INSTRUCTIONS GIVEN AND DISCUSSED: Report any new lumps, bone pain or shortness of breath  SPECIAL INSTRUCTIONS/FOLLOW-UP: Return for follow-up in 6 months.  Thank you for choosing [facility] to provide your oncology and hematology care.  To afford each patient quality time with our providers, please arrive at least 15 minutes before your scheduled appointment time.  With your help, our goal is to use those 15 minutes to complete the necessary work-up to ensure our physicians have the information they need to help with your evaluation and healthcare recommendations.    Effective January 1st, 2014, we ask that you re-schedule your appointment with our physicians should you arrive 10 or more minutes late for your appointment.  We strive to give you quality time with our providers, and arriving late affects you and other patients whose appointments are after yours.    Again, thank you for choosing [facility].  Our hope is that these requests will decrease the amount of time that you wait before being seen by our physicians.       _____________________________________________________________  Should you have questions after your visit to [facility], please contact our office at [phone] between the hours of 8:30 a.m. and 5:00 p.m.  Voicemails left after 4:30 p.m. will not be returned until the following business day.  For prescription refill requests, have your pharmacy contact our office with your prescription refill request.

## 2012-07-16 NOTE — Progress Notes (Signed)
Problem number 1 stage II, infiltrating ductal carcinoma the right breast, presenting with a mass in the right breast and a clinically positive lymph node in the right axilla, triple negative, Ki-67 marker high at 55%, status post FEC x4 cycles in a dose dense fashion followed by docetaxel x4 cycles in a dose dense fashion. Date of surgery was 09/13/2008 finding a radial scar only and 23 of 23 lymph nodes all negative. She was treated radiation therapy thus far has no evidence for recurrence. She does have a small rash consistent with eczema on the right upper portion of the breast/axillary line, anteriorly approximately -1 x 1 cm. She also has a small to by 6 mm erythematous nonpalpable lesion just below the breast crease on the right unclear as to etiology that occurred at the same time as the other lesion in the anterior axillary line mentioned above. Both are somewhat itchy. She is now on Diflucan 150 mg once a week x3 weeks per her PCP and an antifungal/steroid cream twice a day. If This does not go away she will see her dermatologist and has an appointment in February either way. Other than that her oncology review of systems is negative. Vital signs are stable. She is in no acute distress. She has no lymphadenopathy in the cervical, supraclavicular, infraclavicular, axillary or inguinal areas. Her lungs are clear to auscultation and percussion. Breast her skin exam is unremarkable. Her abdomen is soft and nontender without hepatosplenomegaly. Heart shows a regular rhythm and rate without murmur rub or gallop. Both breasts show no masses. She has no leg edema no arm edema.  We'll see what the above-mentioned skin lesions do it if they do not go away she will let me know. Otherwise we'll tentatively see her in 6 months

## 2012-07-16 NOTE — Progress Notes (Signed)
Erika Ramos presented for labwork. Labs per MD order drawn via Peripheral Line 23 gauge needle inserted in left hand  Good blood return present. Procedure without incident.  Needle removed intact. Patient tolerated procedure well.

## 2012-07-20 ENCOUNTER — Encounter: Payer: Self-pay | Admitting: Radiation Oncology

## 2012-07-20 NOTE — Progress Notes (Signed)
Left phone message with patient on cell phone to call for follow up appointment if she was concerned re: skin symptoms.

## 2012-09-21 HISTORY — PX: OTHER SURGICAL HISTORY: SHX169

## 2012-11-03 ENCOUNTER — Other Ambulatory Visit (HOSPITAL_COMMUNITY): Payer: Self-pay | Admitting: Internal Medicine

## 2012-11-03 DIAGNOSIS — Z0000 Encounter for general adult medical examination without abnormal findings: Secondary | ICD-10-CM

## 2012-11-03 DIAGNOSIS — M810 Age-related osteoporosis without current pathological fracture: Secondary | ICD-10-CM

## 2012-11-09 ENCOUNTER — Ambulatory Visit (HOSPITAL_COMMUNITY)
Admission: RE | Admit: 2012-11-09 | Discharge: 2012-11-09 | Disposition: A | Discharge status: Home / Self Care | Payer: Medicare Other | Source: Ambulatory Visit | Attending: Internal Medicine | Admitting: Internal Medicine

## 2012-11-09 DIAGNOSIS — M810 Age-related osteoporosis without current pathological fracture: Secondary | ICD-10-CM

## 2012-11-09 DIAGNOSIS — M899 Disorder of bone, unspecified: Secondary | ICD-10-CM | POA: Insufficient documentation

## 2012-11-09 DIAGNOSIS — M949 Disorder of cartilage, unspecified: Secondary | ICD-10-CM | POA: Insufficient documentation

## 2012-11-09 DIAGNOSIS — Z780 Asymptomatic menopausal state: Secondary | ICD-10-CM | POA: Insufficient documentation

## 2012-11-09 DIAGNOSIS — Z0000 Encounter for general adult medical examination without abnormal findings: Secondary | ICD-10-CM

## 2012-11-10 ENCOUNTER — Ambulatory Visit (INDEPENDENT_AMBULATORY_CARE_PROVIDER_SITE_OTHER): Payer: BC Managed Care – PPO | Admitting: Ophthalmology

## 2012-11-17 ENCOUNTER — Telehealth (HOSPITAL_COMMUNITY): Payer: Self-pay

## 2012-11-17 NOTE — Telephone Encounter (Signed)
Spoke with patient regarding her bone density.  Instructed that Dr. Mariel Sleet recommends that she take Calcium 600mg  and Vitamin D 2000IU daily.  Stated she also received message from ordering MD that she needed to pick up a new prescription for fosamax also.  Plans to get the calcium, vitamin D and the fosamax and will begin taking.

## 2012-11-29 ENCOUNTER — Ambulatory Visit (INDEPENDENT_AMBULATORY_CARE_PROVIDER_SITE_OTHER): Payer: Medicare Other | Admitting: Ophthalmology

## 2012-11-29 DIAGNOSIS — I10 Essential (primary) hypertension: Secondary | ICD-10-CM

## 2012-11-29 DIAGNOSIS — H35039 Hypertensive retinopathy, unspecified eye: Secondary | ICD-10-CM

## 2012-11-29 DIAGNOSIS — H33309 Unspecified retinal break, unspecified eye: Secondary | ICD-10-CM

## 2012-11-29 DIAGNOSIS — H35379 Puckering of macula, unspecified eye: Secondary | ICD-10-CM

## 2012-11-29 DIAGNOSIS — H43819 Vitreous degeneration, unspecified eye: Secondary | ICD-10-CM

## 2013-01-14 ENCOUNTER — Ambulatory Visit (HOSPITAL_COMMUNITY): Payer: BC Managed Care – PPO

## 2013-01-20 ENCOUNTER — Encounter (HOSPITAL_COMMUNITY): Payer: Self-pay

## 2013-02-04 ENCOUNTER — Encounter (HOSPITAL_COMMUNITY): Payer: Self-pay

## 2013-02-04 ENCOUNTER — Encounter: Payer: Self-pay | Admitting: *Deleted

## 2013-02-09 ENCOUNTER — Encounter: Payer: Self-pay | Admitting: Cardiology

## 2013-02-09 ENCOUNTER — Ambulatory Visit (INDEPENDENT_AMBULATORY_CARE_PROVIDER_SITE_OTHER): Payer: Medicare Other | Admitting: Cardiology

## 2013-02-09 VITALS — BP 146/68 | HR 89 | Ht 66.0 in | Wt 216.0 lb

## 2013-02-09 DIAGNOSIS — I10 Essential (primary) hypertension: Secondary | ICD-10-CM

## 2013-02-09 DIAGNOSIS — E785 Hyperlipidemia, unspecified: Secondary | ICD-10-CM

## 2013-02-09 DIAGNOSIS — I2510 Atherosclerotic heart disease of native coronary artery without angina pectoris: Secondary | ICD-10-CM

## 2013-02-09 NOTE — Patient Instructions (Addendum)
Your physician wants you to follow-up in: 1 year with Dr McLean. (August 2015).  You will receive a reminder letter in the mail two months in advance. If you don't receive a letter, please call our office to schedule the follow-up appointment.  

## 2013-02-10 NOTE — Progress Notes (Signed)
Patient ID: Erika Ramos, female   DOB: 07-04-1945, 67 y.o.   MRN: [id number] PCP: Dr. Phillips Odor  67 yo with history of CAD s/p PCI RCA/PTCA PDA in 2001 presents for cardiology followup.  She was last seen by Dr. Daleen Squibb back in 2011.  Since then, she seems to have done well.  No chest pain.  No exertional dyspnea.  She does not get much exercise, however.  BP is running high today in the office, but she checks at home and it tends to run around 120/70. No orthopnea, PND, palpitations, syncope.   ECG: NSR, LAFB  Labs (1/14): creatinine 0.6  PMH: 1. Hyperlipidemia 2. CAD: Stent RCA and PTCA PDA in 2001.  Echo (4/11) with EF 60-65%.  Cardiolite (6/11) with small apical perfusion defect defect, possible attenuation, overall low risk with EF 69%.   3. Breast cancer: s/p lumpectomy in 2009 followed by chemotherapy/radiation.  4. HTN 5. Obesity  SH: Retired, nonsmoker, lives in [place]  FH: Mother with MI at 80, grandmother with MI x 5 and CVAs  ROS: All systems reviewed and negative except as per HPI.   Current Outpatient Prescriptions  Medication Sig Dispense Refill  . alendronate (FOSAMAX) 70 MG tablet once a week.      Marland Kitchen aspirin EC 81 MG tablet Take 81 mg by mouth daily.       . Calcium-Vitamin D-Vitamin K 500-500-40 MG-UNT-MCG CHEW Chew by mouth daily.      . Cholecalciferol (VITAMIN D-3 PO) Take 200 mg by mouth daily.      . Multiple Vitamins-Minerals (CENTRUM SILVER ULTRA WOMENS PO) Take 1 tablet by mouth daily.       Marland Kitchen omeprazole (PRILOSEC) 20 MG capsule Take 20 mg by mouth 2 (two) times a week.       . ramipril (ALTACE) 10 MG capsule Take 10 mg by mouth daily.        . simvastatin (ZOCOR) 20 MG tablet Take 20 mg by mouth at bedtime.         No current facility-administered medications for this visit.    BP 146/68  Pulse 89  Ht 5\' 6"  (1.676 m)  Wt 97.977 kg (216 lb)  BMI 34.88 kg/m2 General: NAD Neck: No JVD, no thyromegaly or thyroid nodule.  Lungs: Clear to  auscultation bilaterally with normal respiratory effort. CV: Nondisplaced PMI.  Heart regular S1/S2, no S3/S4, no murmur.  No peripheral edema.  No carotid bruit.  Normal pedal pulses.  Abdomen: Soft, nontender, no hepatosplenomegaly, no distention.  Skin: Intact without lesions or rashes.  Neurologic: Alert and oriented x 3.  Psych: Normal affect. Extremities: No clubbing or cyanosis.  HEENT: Normal.   Assessment/Plan: 1. CAD: Stable recently without ischemic symptoms.  Last stress test was a Cardiolite in 6/11 that was low risk.  EF preserved on echo in 4/11.  Continue ASA 81, ACEI, and simvastatin. Major issue at this point is going to be diet/increased exercise for weight loss.  2. HTN: BP high in the office today but normal range when she checks at home.  No changes to regimen.  3. Hyperlipidemia: To get lipids with PCP next week.  I asked her to send me a copy.   Marca Ancona 02/11/2013

## 2013-05-27 ENCOUNTER — Encounter (HOSPITAL_COMMUNITY): Payer: Self-pay

## 2013-05-27 ENCOUNTER — Encounter (HOSPITAL_COMMUNITY): Payer: Medicare Other | Attending: Hematology and Oncology

## 2013-05-27 VITALS — BP 146/82 | HR 89 | Temp 97.9°F | Resp 20 | Wt 209.0 lb

## 2013-05-27 DIAGNOSIS — Z853 Personal history of malignant neoplasm of breast: Secondary | ICD-10-CM | POA: Insufficient documentation

## 2013-05-27 DIAGNOSIS — C50919 Malignant neoplasm of unspecified site of unspecified female breast: Secondary | ICD-10-CM

## 2013-05-27 DIAGNOSIS — M899 Disorder of bone, unspecified: Secondary | ICD-10-CM

## 2013-05-27 DIAGNOSIS — L989 Disorder of the skin and subcutaneous tissue, unspecified: Secondary | ICD-10-CM

## 2013-05-27 DIAGNOSIS — Z09 Encounter for follow-up examination after completed treatment for conditions other than malignant neoplasm: Secondary | ICD-10-CM | POA: Insufficient documentation

## 2013-05-27 LAB — CBC WITH DIFFERENTIAL/PLATELET
Hemoglobin: 14.8 g/dL (ref 12.0–15.0)
Lymphocytes Relative: 30 % (ref 12–46)
Lymphs Abs: 3 10*3/uL (ref 0.7–4.0)
MCH: 28.8 pg (ref 26.0–34.0)
MCV: 85.8 fL (ref 78.0–100.0)
Monocytes Relative: 6 % (ref 3–12)
Neutrophils Relative %: 63 % (ref 43–77)
Platelets: 228 10*3/uL (ref 150–400)
RBC: 5.14 MIL/uL — ABNORMAL HIGH (ref 3.87–5.11)
WBC: 10 10*3/uL (ref 4.0–10.5)

## 2013-05-27 LAB — COMPREHENSIVE METABOLIC PANEL
ALT: 27 U/L (ref 0–35)
Alkaline Phosphatase: 74 U/L (ref 39–117)
BUN: 20 mg/dL (ref 6–23)
CO2: 23 mEq/L (ref 19–32)
Chloride: 101 mEq/L (ref 96–112)
GFR calc Af Amer: >90 mL/min (ref >90)
GFR calc non Af Amer: 86 mL/min — ABNORMAL LOW (ref >90)
Glucose, Bld: 92 mg/dL (ref 70–99)
Potassium: 3.9 mEq/L (ref 3.5–5.1)
Sodium: 136 mEq/L (ref 135–145)
Total Bilirubin: 0.3 mg/dL (ref 0.3–1.2)

## 2013-05-27 NOTE — Patient Instructions (Signed)
[facility] Discharge Instructions  RECOMMENDATIONS MADE BY THE CONSULTANT AND ANY TEST RESULTS WILL BE SENT TO YOUR REFERRING PHYSICIAN.  Lab work today. We will call you if there are any abnormal results. Dr.Formanek will be in touch with Dr.Hall about doing a skin biopsy on the rash area to right breast. Mammogram due January 2015. MD appointment again in 6 months. Report any issues/concerns to clinic as needed prior to appointment.  Thank you for choosing [facility] to provide your oncology and hematology care.  To afford each patient quality time with our providers, please arrive at least 15 minutes before your scheduled appointment time.  With your help, our goal is to use those 15 minutes to complete the necessary work-up to ensure our physicians have the information they need to help with your evaluation and healthcare recommendations.    Effective January 1st, 2014, we ask that you re-schedule your appointment with our physicians should you arrive 10 or more minutes late for your appointment.  We strive to give you quality time with our providers, and arriving late affects you and other patients whose appointments are after yours.    Again, thank you for choosing [facility].  Our hope is that these requests will decrease the amount of time that you wait before being seen by our physicians.       _____________________________________________________________  Should you have questions after your visit to [facility], please contact our office at [phone] between the hours of 8:30 a.m. and 5:00 p.m.  Voicemails left after 4:30 p.m. will not be returned until the following business day.  For prescription refill requests, have your pharmacy contact our office with your prescription refill request.

## 2013-05-27 NOTE — Progress Notes (Signed)
[facility]  OFFICE PROGRESS NOTE  Colette Ribas, MD [address] [place] [address]  DIAGNOSIS: Malignant neoplasm of breast (female), unspecified site - Plan: CBC with Differential, Comprehensive metabolic panel, CEA, Cancer antigen 27.29, MM Digital Diagnostic Bilat, CBC with Differential, Comprehensive metabolic panel, CEA, Cancer antigen 27.29  Chief Complaint  Patient presents with  . Breast Cancer    CURRENT THERAPY: Watchful expectation.  INTERVAL HISTORY: Erika Ramos 67 y.o. female returns for followup of stage II right triple negative breast cancer, status post lumpectomy, axillary lymph node dissection, FEC x4, Taxotere x4, external beam radiotherapy, with last mammogram done in January of 2014, surgery performed 09/13/2008.  A persistent area of rash exists in the lateral region of the right breast, intermittently pruritic, never bleeding, and never hemorrhagic. Multiple attempts have been made to control this lesion with antifungal agents, antibacterial agents, topical steroids. In general patient feels well with no fever, night sweats, diarrhea, constipation, melena, hematochezia, hematuria, nasal symptoms, lower extremity swelling or redness, chest pain, PND, orthopnea, palpitations, peripheral paresthesias, headache, or seizures.  MEDICAL HISTORY: Past Medical History  Diagnosis Date  . Stented coronary artery   . PONV (postoperative nausea and vomiting)     Nausea/ Vomitting- last time 2010  . GERD (gastroesophageal reflux disease)   . Coronary artery disease   . Breast cancer   . Fatty liver disease, nonalcoholic     INTERIM HISTORY: has ADENOCARCINOMA, RIGHT BREAST; HYPERLIPIDEMIA; HYPERTENSION; CAD, NATIVE VESSEL; GERD; HEMATOCHEZIA; EDEMA; DYSPNEA; and Epiretinal membrane, right eye on her problem list.   Stage II, infiltrating ductal carcinoma the right breast, presenting with  a mass in the right breast and a clinically positive lymph node in the right axilla, triple negative, Ki-67 marker high at 55%, status post FEC x4 cycles in a dose dense fashion followed by docetaxel x4 cycles in a dose dense fashion. Date of surgery was 09/13/2008 finding a radial scar only and 23 of 23 lymph nodes all negative. She was treated radiation therapy thus far has no evidence for recurrence.  ALLERGIES:  is allergic to clopidogrel bisulfate.  MEDICATIONS: has a current medication list which includes the following prescription(s): alendronate, aspirin ec, calcium-vitamin d-vitamin k, cholecalciferol, multiple vitamins-minerals, omeprazole, ramipril, and simvastatin.  SURGICAL HISTORY:  Past Surgical History  Procedure Laterality Date  . Retinal detachment surgery    . Tonsillectomy    . Abdominal hysterectomy    . Breast surgery  March 2010  . Fracture surgery      wrist  right  . Pars plana vitrectomy  07/03/2011    Procedure: PARS PLANA VITRECTOMY WITH 25 GAUGE;  Surgeon: Sherrie George, MD;  Location: [facility] OR;  Service: Ophthalmology;  Laterality: Right;  MEMBRANE PEEL, HEAD SCOPE LASER, GAS  . Retinal detachment surgery  07/03/11    right  . Cataract  Right 09/2012    FAMILY HISTORY: family history is negative for Anesthesia problems.  SOCIAL HISTORY:  reports that she has never smoked. She does not have any smokeless tobacco history on file. She reports that she does not drink alcohol or use illicit drugs.  REVIEW OF SYSTEMS:  Other than that discussed above is noncontributory.  PHYSICAL EXAMINATION: ECOG PERFORMANCE STATUS: 1 - Symptomatic but completely ambulatory  Blood pressure 146/82, pulse 89, temperature 97.9 F (36.6 C), temperature source Oral, resp. rate 20, weight 209 lb (94.802 kg).  GENERAL:alert, no  distress and comfortable SKIN: Right anterior chest wall areas of mildly desquamative indurated rash with no surrounding erythema. EYES: PERLA; Conjunctiva  are pink and non-injected, sclera clear OROPHARYNX:no exudate, no erythema on lips, buccal mucosa, or tongue. NECK: supple, thyroid normal size, non-tender, without nodularity. No masses CHEST: Status post right lumpectomy with bilateral pendulous breasts manifesting no discrete masses. LYMPH:  no palpable lymphadenopathy in the cervical, axillary or inguinal LUNGS: clear to auscultation and percussion with normal breathing effort HEART: regular rate & rhythm and no murmurs. ABDOMEN:abdomen soft, non-tender and normal bowel sounds MUSCULOSKELETAL:no cyanosis of digits and no clubbing. Range of motion normal.  NEURO: alert & oriented x 3 with fluent speech, no focal motor/sensory deficits   LABORATORY DATA: No visits with results within 30 Day(s) from this visit. Latest known visit with results is:  Office Visit on 07/16/2012  Component Date Value Range Status  . WBC 07/16/2012 7.5  4.0 - 10.5 K/uL Final  . RBC 07/16/2012 5.05  3.87 - 5.11 MIL/uL Final  . Hemoglobin 07/16/2012 14.3  12.0 - 15.0 g/dL Final  . HCT 16/03/9603 42.9  36.0 - 46.0 % Final  . MCV 07/16/2012 85.0  78.0 - 100.0 fL Final  . MCH 07/16/2012 28.3  26.0 - 34.0 pg Final  . MCHC 07/16/2012 33.3  30.0 - 36.0 g/dL Final  . RDW 54/02/8118 12.8  11.5 - 15.5 % Final  . Platelets 07/16/2012 235  150 - 400 K/uL Final  . Neutrophils Relative % 07/16/2012 54  43 - 77 % Final  . Neutro Abs 07/16/2012 4.0  1.7 - 7.7 K/uL Final  . Lymphocytes Relative 07/16/2012 37  12 - 46 % Final  . Lymphs Abs 07/16/2012 2.8  0.7 - 4.0 K/uL Final  . Monocytes Relative 07/16/2012 6  3 - 12 % Final  . Monocytes Absolute 07/16/2012 0.5  0.1 - 1.0 K/uL Final  . Eosinophils Relative 07/16/2012 3  0 - 5 % Final  . Eosinophils Absolute 07/16/2012 0.2  0.0 - 0.7 K/uL Final  . Basophils Relative 07/16/2012 0  0 - 1 % Final  . Basophils Absolute 07/16/2012 0.0  0.0 - 0.1 K/uL Final  . Sodium 07/16/2012 138  135 - 145 mEq/L Final  . Potassium  07/16/2012 4.0  3.5 - 5.1 mEq/L Final  . Chloride 07/16/2012 103  96 - 112 mEq/L Final  . CO2 07/16/2012 24  19 - 32 mEq/L Final  . Glucose, Bld 07/16/2012 101* 70 - 99 mg/dL Final  . BUN 14/78/2956 13  6 - 23 mg/dL Final  . Creatinine, Ser 07/16/2012 0.60  0.50 - 1.10 mg/dL Final  . Calcium 21/30/8657 9.5  8.4 - 10.5 mg/dL Final  . Total Protein 07/16/2012 7.6  6.0 - 8.3 g/dL Final  . Albumin 84/69/6295 4.0  3.5 - 5.2 g/dL Final  . AST 28/41/3244 49* 0 - 37 U/L Final  . ALT 07/16/2012 51* 0 - 35 U/L Final  . Alkaline Phosphatase 07/16/2012 89  39 - 117 U/L Final  . Total Bilirubin 07/16/2012 0.3  0.3 - 1.2 mg/dL Final  . GFR calc non Af Amer 07/16/2012 >90  >90 mL/min Final  . GFR calc Af Amer 07/16/2012 >90  >90 mL/min Final   Comment:                                 The eGFR has been calculated  using the CKD EPI equation.                          This calculation has not been                          validated in all clinical                          situations.                          eGFR's persistently                          <90 mL/min signify                          possible Chronic Kidney Disease.  Marland Kitchen CA 27.29 07/16/2012 23  0 - 39 U/mL Final    PATHOLOGY: No new pathology.  Urinalysis No results found for this basename: colorurine,  appearanceur,  labspec,  phurine,  glucoseu,  hgbur,  bilirubinur,  ketonesur,  proteinur,  urobilinogen,  nitrite,  leukocytesur    RADIOGRAPHIC STUDIES: Mammogram will be scheduled for January 2015  ASSESSMENT:  #1. Stage II triple negative right breast cancer, no evidence of disease pending today's laboratories. #2. Two areas of skin inflammation involving the right lateral portion of the chest wall near the right breast, nonresponsive antifungal, antibacterial, and anti-inflammatory topical applications. #3. Hypertension, controlled. #4. Osteopenia, on treatment.   PLAN:  #1. Patient does have an  appointment with her dermatologist in 6 days. Highly recommend biopsy with dermatopathic biology evaluation including flow cytometry to exclude peripheral T-cell lymphoma. #2. Continue monthly self breast examination and her current medications. #3. Repeat bilateral mammography in January 2015. #4. Followup in 6 months with CBC, chem profile, CEA, and CA 27-29.   All questions were answered. The patient knows to call the clinic with any problems, questions or concerns. We can certainly see the patient much sooner if necessary.   I spent 25 minutes counseling the patient face to face. The total time spent in the appointment was 30 minutes.    Maurilio Lovely, MD 05/27/2013 4:20 PM

## 2013-05-27 NOTE — Progress Notes (Signed)
Erika Ramos presented for labwork. Labs per MD order drawn via Peripheral Line 23 gauge needle inserted in left hand.  Good blood return present. Procedure without incident.  Needle removed intact. Patient tolerated procedure well.

## 2013-05-28 LAB — CEA: CEA: <0.5 ng/mL (ref 0.0–5.0)

## 2013-07-01 ENCOUNTER — Inpatient Hospital Stay: Admission: RE | Admit: 2013-07-01 | Payer: Medicare Other | Source: Ambulatory Visit

## 2013-11-22 NOTE — Progress Notes (Signed)
No show

## 2013-11-25 ENCOUNTER — Ambulatory Visit (HOSPITAL_COMMUNITY): Payer: Medicare Other | Admitting: Oncology

## 2013-12-07 ENCOUNTER — Ambulatory Visit (INDEPENDENT_AMBULATORY_CARE_PROVIDER_SITE_OTHER): Payer: Medicare Other | Admitting: Ophthalmology

## 2014-02-08 ENCOUNTER — Ambulatory Visit
Admission: RE | Admit: 2014-02-08 | Discharge: 2014-02-08 | Disposition: A | Discharge status: Home / Self Care | Payer: Medicare Other | Source: Ambulatory Visit | Attending: Hematology and Oncology | Admitting: Hematology and Oncology

## 2014-02-08 DIAGNOSIS — C50919 Malignant neoplasm of unspecified site of unspecified female breast: Secondary | ICD-10-CM

## 2014-08-23 DIAGNOSIS — E669 Obesity, unspecified: Secondary | ICD-10-CM | POA: Insufficient documentation

## 2015-02-28 DIAGNOSIS — L80 Vitiligo: Secondary | ICD-10-CM | POA: Insufficient documentation

## 2015-03-29 ENCOUNTER — Other Ambulatory Visit: Payer: Self-pay

## 2015-03-29 ENCOUNTER — Other Ambulatory Visit: Payer: Self-pay | Admitting: Family Medicine

## 2015-03-29 DIAGNOSIS — Z853 Personal history of malignant neoplasm of breast: Secondary | ICD-10-CM

## 2015-05-15 ENCOUNTER — Ambulatory Visit
Admission: RE | Admit: 2015-05-15 | Discharge: 2015-05-15 | Disposition: A | Discharge status: Home / Self Care | Payer: Medicare Other | Source: Ambulatory Visit | Attending: Family Medicine | Admitting: Family Medicine

## 2015-05-15 DIAGNOSIS — Z853 Personal history of malignant neoplasm of breast: Secondary | ICD-10-CM

## 2016-03-31 NOTE — Progress Notes (Signed)
Cardiology Office Note   Date:  04/03/2016   ID:  PEYSON COPADO, DOB 09-09-1945, MRN [id number]  PCP:  Purvis Kilts, MD  Cardiologist:   Jenkins Rouge, MD   No chief complaint on file.     History of Present Illness: Erika Ramos is a 70 y.o. female who presents for f/u of HTN , DM and CAD Previously seen by Dr Verl Blalock In 2011 and more recently by Dr Aundra Dubin in 2014  Had PCI of RCA with stent  back in 2001  Echo 2011 with normal EF 60-65% Cardiolie June 2011 with small apical defect no ischemia EF 69%  In interim she was Rx for breast cancer with lumpectomy chemo and radiation.   Had some SSCP 3 months ago did not take nitro.  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 Breast cancer ([place])   . Coronary artery disease   . Fatty liver disease, nonalcoholic   . GERD (gastroesophageal reflux disease)   . PONV (postoperative nausea and vomiting)    Nausea/ Vomitting- last time 2010  . Stented coronary artery     Past Surgical History:  Procedure Laterality Date  . ABDOMINAL HYSTERECTOMY    . BREAST SURGERY  March 2010  . cataract  Right 09/2012  . FRACTURE SURGERY     wrist  right  . PARS PLANA VITRECTOMY  07/03/2011   Procedure: PARS PLANA VITRECTOMY WITH 25 GAUGE;  Surgeon: Hayden Pedro, MD;  Location: [place];  Service: Ophthalmology;  Laterality: Right;  MEMBRANE PEEL, HEAD SCOPE LASER, GAS  . RETINAL DETACHMENT SURGERY    . RETINAL DETACHMENT SURGERY  07/03/11   right  . TONSILLECTOMY       Current Outpatient Prescriptions  Medication Sig Dispense Refill  . aspirin EC 81 MG tablet Take 81 mg by mouth daily.     . Calcium-Vitamin D-Vitamin K [id number] MG-UNT-MCG CHEW Chew by mouth daily.    . metFORMIN (GLUCOPHAGE) 500 MG tablet Take 500 mg by mouth daily with breakfast.     . Multiple Vitamins-Minerals (CENTRUM SILVER  ULTRA WOMENS PO) Take 1 tablet by mouth daily.     Marland Kitchen omeprazole (PRILOSEC) 20 MG capsule Take 20 mg by mouth 2 (two) times a week.     . ramipril (ALTACE) 10 MG capsule Take 10 mg by mouth daily.      . simvastatin (ZOCOR) 40 MG tablet Take 40 mg by mouth daily.     No current facility-administered medications for this visit.     Allergies:   Clopidogrel bisulfate    Social History:  The patient  reports that she has never smoked. She has never used smokeless tobacco. She reports that she does not drink alcohol or use drugs.   Family History:  The patient's family history is not on file.    ROS:  Please see the history of present illness.   Otherwise, review of systems are positive for none.   All other systems are reviewed and negative.    PHYSICAL EXAM: VS:  BP 116/62   Pulse 95   Ht 5\' 5"  (1.651 m)   Wt 86.2 kg (190 lb)   SpO2 100%   BMI 31.62 kg/m  , BMI Body mass index is 31.62 kg/m. Affect appropriate Healthy:  appears stated age 13: normal Neck  supple with no adenopathy JVP normal no bruits no thyromegaly Lungs clear with no wheezing and good diaphragmatic motion Heart:  S1/S2 no murmur, no rub, gallop or click PMI normal Abdomen: benighn, BS positve, no tenderness, no AAA no bruit.  No HSM or HJR Distal pulses intact with no bruits No edema Neuro non-focal Skin warm and dry No muscular weakness    EKG:  2014 SR rate 83  LAFB otherwise normal  04/03/16 SR old IMI no acute changes    Recent Labs: No results found for requested labs within last 8760 hours.    Lipid Panel No results found for: CHOL, TRIG, HDL, CHOLHDL, VLDL, LDLCALC, LDLDIRECT    Wt Readings from Last 3 Encounters:  04/03/16 86.2 kg (190 lb)  05/27/13 94.8 kg (209 lb)  02/09/13 98 kg (216 lb)      Other studies Reviewed: Additional studies/ records that were reviewed today include: Notes / Cath Dr Fletcher Anon and [place] Situ Notes primary Dr Leveda Anna .    ASSESSMENT AND PLAN:  1.  CAD  new DM and some SSCP a few months ago f/u ex myovue  2. Chol: on statin labs with primary  3. HTN: Well controlled.  Continue current medications and low sodium Dash type diet.   4. DM: Discussed low carb diet and portion control f/u primary target A1c 6.0 5. Obesity  Discussed diet and exercise    Current medicines are reviewed at length with the patient today.  The patient does not have concerns regarding medicines.  The following changes have been made:  no change  Labs/ tests ordered today include: Ex Myovue   Orders Placed This Encounter  Procedures  . Flu Vaccine QUAD 36+ mos IM  . EKG 12-Lead     Disposition:   FU with me in a year if myovue ok      Signed, Jenkins Rouge, MD  04/03/2016 10:12 AM    [address] [place], [place], [place]  [address] Phone: [phone]; Fax: [phone]

## 2016-04-03 ENCOUNTER — Encounter: Payer: Self-pay | Admitting: Cardiovascular Disease

## 2016-04-03 ENCOUNTER — Ambulatory Visit (INDEPENDENT_AMBULATORY_CARE_PROVIDER_SITE_OTHER): Payer: Medicare Other | Admitting: Cardiovascular Disease

## 2016-04-03 VITALS — BP 116/62 | HR 95 | Ht 65.0 in | Wt 190.0 lb

## 2016-04-03 DIAGNOSIS — Z23 Encounter for immunization: Secondary | ICD-10-CM | POA: Diagnosis not present

## 2016-04-03 DIAGNOSIS — R079 Chest pain, unspecified: Secondary | ICD-10-CM | POA: Diagnosis not present

## 2016-04-03 DIAGNOSIS — I10 Essential (primary) hypertension: Secondary | ICD-10-CM

## 2016-04-03 NOTE — Patient Instructions (Signed)
Medication Instructions:  Your physician recommends that you continue on your current medications as directed. Please refer to the Current Medication list given to you today.   Labwork: none  Testing/Procedures: Your physician has requested that you have en exercise stress myoview. For further information please visit [email or web address]. Please follow instruction sheet, as given.    Follow-Up: Your physician wants you to follow-up in: 1 year .  You will receive a reminder letter in the mail two months in advance. If you don't receive a letter, please call our office to schedule the follow-up appointment.   Any Other Special Instructions Will Be Listed Below (If Applicable).     If you need a refill on your cardiac medications before your next appointment, please call your pharmacy.

## 2016-04-10 ENCOUNTER — Other Ambulatory Visit: Payer: Self-pay | Admitting: Family Medicine

## 2016-04-10 DIAGNOSIS — Z853 Personal history of malignant neoplasm of breast: Secondary | ICD-10-CM

## 2016-04-16 ENCOUNTER — Encounter (HOSPITAL_COMMUNITY)
Admission: RE | Admit: 2016-04-16 | Discharge: 2016-04-16 | Disposition: A | Discharge status: Home / Self Care | Payer: Medicare Other | Source: Ambulatory Visit | Attending: Cardiovascular Disease | Admitting: Cardiovascular Disease

## 2016-04-16 ENCOUNTER — Encounter (HOSPITAL_COMMUNITY): Payer: Self-pay

## 2016-04-16 ENCOUNTER — Inpatient Hospital Stay (HOSPITAL_COMMUNITY): Admission: RE | Admit: 2016-04-16 | Payer: Medicare Other | Source: Ambulatory Visit

## 2016-04-16 DIAGNOSIS — R079 Chest pain, unspecified: Secondary | ICD-10-CM | POA: Diagnosis not present

## 2016-04-16 HISTORY — DX: Type 2 diabetes mellitus without complications: E11.9

## 2016-04-16 HISTORY — DX: Essential (primary) hypertension: I10

## 2016-04-16 LAB — NM MYOCAR MULTI W/SPECT W/WALL MOTION / EF
CHL CUP NUCLEAR SDS: 3
CSEPEW: 7 METS
CSEPPHR: 144 {beats}/min
Exercise duration (min): 4 min
Exercise duration (sec): 49 s
LHR: 0.39
LVDIAVOL: 33 mL (ref 46–106)
LVSYSVOL: 8 mL
MPHR: 152 {beats}/min
Percent HR: 94 %
RPE: 13
Rest HR: 94 {beats}/min
SRS: 6
SSS: 9
TID: 1.09

## 2016-04-16 MED ORDER — TECHNETIUM TC 99M TETROFOSMIN IV KIT
10.0000 | PACK | Freq: Once | INTRAVENOUS | Status: AC | PRN
  Administered 2016-04-16: 10.5 via INTRAVENOUS

## 2016-04-16 MED ORDER — TECHNETIUM TC 99M TETROFOSMIN IV KIT
30.0000 | PACK | Freq: Once | INTRAVENOUS | Status: AC | PRN
  Administered 2016-04-16: 30 via INTRAVENOUS

## 2016-04-16 MED ORDER — SODIUM CHLORIDE 0.9% FLUSH
INTRAVENOUS | Status: AC
  Administered 2016-04-16: 10 mL via INTRAVENOUS
  Filled 2016-04-16: qty 10

## 2016-04-16 MED ORDER — REGADENOSON 0.4 MG/5ML IV SOLN
INTRAVENOUS | Status: AC
  Filled 2016-04-16: qty 5

## 2016-05-21 ENCOUNTER — Ambulatory Visit
Admission: RE | Admit: 2016-05-21 | Discharge: 2016-05-21 | Disposition: A | Discharge status: Home / Self Care | Payer: Medicare Other | Source: Ambulatory Visit | Attending: Family Medicine | Admitting: Family Medicine

## 2016-05-21 DIAGNOSIS — Z853 Personal history of malignant neoplasm of breast: Secondary | ICD-10-CM

## 2016-06-09 ENCOUNTER — Ambulatory Visit (HOSPITAL_COMMUNITY): Payer: Medicare Other | Admitting: Oncology

## 2016-06-18 ENCOUNTER — Encounter (HOSPITAL_COMMUNITY): Payer: Medicare Other | Attending: Oncology | Admitting: Oncology

## 2016-06-18 ENCOUNTER — Encounter (HOSPITAL_COMMUNITY): Payer: Self-pay | Admitting: Oncology

## 2016-06-18 VITALS — BP 157/78 | HR 75 | Temp 98.1°F | Resp 16 | Ht 65.25 in | Wt 186.3 lb

## 2016-06-18 DIAGNOSIS — C773 Secondary and unspecified malignant neoplasm of axilla and upper limb lymph nodes: Secondary | ICD-10-CM | POA: Diagnosis not present

## 2016-06-18 DIAGNOSIS — R32 Unspecified urinary incontinence: Secondary | ICD-10-CM

## 2016-06-18 DIAGNOSIS — C50911 Malignant neoplasm of unspecified site of right female breast: Secondary | ICD-10-CM

## 2016-06-18 DIAGNOSIS — C50411 Malignant neoplasm of upper-outer quadrant of right female breast: Secondary | ICD-10-CM

## 2016-06-18 DIAGNOSIS — C50919 Malignant neoplasm of unspecified site of unspecified female breast: Secondary | ICD-10-CM

## 2016-06-18 NOTE — Assessment & Plan Note (Addendum)
Stage II (T2N1M0) right breast cancer, TRIPLE NEGATIVE, S/P neoadjuvant chemotherapy consisting of FEC x 4 cycles (05/12/2008- 06/30/2008) followed by Taxotere x 4 cycles (07/14/2008- 08/28/2008).  She then went on to have right breast lumpectomy which demonstrated no residual disease.  She completed her cancer therapy with XRT (10/17/2008- 11/28/2008).  Oncology history updated.  We will try to get labs from Dr. Delanna Ahmadi office for the past year.  She is up to date on mammogram with last one being performed in November 2017 which was negative.  I personally reviewed and went over radiographic studies with the patient.  The results are noted within this dictation.    Order is placed for mammogram next year.  She is up to date on colonoscopy with last one being in 2011.  She is advised to follow-up with primary care provider and/or Gyn for her urinary frequency at [facility] which is inhibiting her ability to sleep restfully.  Return in 12 months for follow-up.  NCCN guidelines recommends the following surveillance for invasive breast cancer (2.2017):  A. History and Physical exam 1-4 times per year as clinically appropriate for 5 years, then annually.  B. Periodic screening for changes in family history and referral to genetics counseling as indicated  C. Educate, monitor, and refer to lymphedema management.  D. Mammography every 12 months  E. Routine imaging of reconstructed breast is not indicated.  F. In the absence of clinical signs and symptoms suggestive of recurrent disease, there is no indication for laboratory or imaging studies for metastases screening.  G. Women on Tamoxifen: annual gynecologic assessment every 12 months if uterus is present.  H. Women on aromatase inhibitor or who experience ovarian failure secondary to treatment should have monitoring of bone health with a bone mineral density determination at baseline and periodically thereafter.  I. Assess and encourage adherence to adjuvant  endocrine therapy.  J. Evidence suggests that active lifestyle, healthy diet, limited alcohol intake, and achieving and maintaining an ideal body weight (20-25 BMI) may lead to optimal breast cancer outcomes.

## 2016-06-18 NOTE — Patient Instructions (Addendum)
[place] at [facility] Discharge Instructions  RECOMMENDATIONS MADE BY THE CONSULTANT AND ANY TEST RESULTS WILL BE SENT TO YOUR REFERRING PHYSICIAN.  You saw Kirby Crigler, PA-C, today.  For itchy dry skin, we recommend a good moisturizing lotion such as Vanicream or Uddercream.  Mammogram in November 2018.  Follow up in 1 year with Dr. Whitney Muse  See Amy at checkout for appointments.  Thank you for choosing [place] at [facility] to provide your oncology and hematology care.  To afford each patient quality time with our provider, please arrive at least 15 minutes before your scheduled appointment time.   Beginning January 23rd 2017 lab work for the [facility] will be done in the  Main lab at [facility] on 1st floor. If you have a lab appointment with the [facility] please come in thru the  Main Entrance and check in at the main information desk  You need to re-schedule your appointment should you arrive 10 or more minutes late.  We strive to give you quality time with our providers, and arriving late affects you and other patients whose appointments are after yours.  Also, if you no show three or more times for appointments you may be dismissed from the clinic at the providers discretion.     Again, thank you for choosing [facility].  Our hope is that these requests will decrease the amount of time that you wait before being seen by our physicians.       _____________________________________________________________  Should you have questions after your visit to [facility], please contact our office at [phone] between the hours of 8:30 a.m. and 4:30 p.m.  Voicemails left after 4:30 p.m. will not be returned until the following business day.  For prescription refill requests, have your pharmacy contact our office.         Resources For Cancer Patients and their Caregivers ? American Cancer  Society: Can assist with transportation, wigs, general needs, runs Look Good Feel Better.        325-537-3028 ? Cancer Care: Provides financial assistance, online support groups, medication/co-pay assistance.  1-800-813-HOPE 717-193-5487) ? [place] Assists [place] cancer patients and their families through emotional , educational and financial support.  (432)877-4912 ? [place] DSS Where to apply for food stamps, Medicaid and utility assistance. (506)328-6637 ? [facility]: Transportation to medical appointments. 430-723-6612 ? Social Security Administration: May apply for disability if have a Stage IV cancer. 279-658-3797 848-190-9383 ? [employer], Disability and Transit Services: Assists with nutrition, care and transit needs. [place] Support Programs: @10RELATIVEDAYS @ > Cancer Support Group  2nd Tuesday of the month 1pm-2pm, Journey Room  > Creative Journey  3rd Tuesday of the month 1130am-1pm, Journey Room  > Look Good Feel Better  1st Wednesday of the month 10am-12 noon, Journey Room (Call [place] to register [phone])

## 2016-06-18 NOTE — Progress Notes (Signed)
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    Breast cancer, stage 2, right ([place])   04/19/2008 Initial Diagnosis    Right breast needle biopsy demonstrates invasive mammary carcinoma.  Right axillary lymph node needle biopsy is positive for metastatic disease.  ER 0%, PR 0%, Ki-67 55%, Her2 negative      04/26/2008 Breast MRI    1. 1.9 x 2.2 x 2.0 cm solitary area of abnormal enhancement in the deep aspect of the upper outer quadrant of the right breast is consistent with biopsy-proven invasive mammary carcinoma.  No evidence of multicentric, multifocal or contralateral disease.   2. A single enlarged, abnormal right axillary lymph node is present and was previously biopsied, and shown to contain malignant cells.      05/12/2008 - 06/30/2008 Chemotherapy    Cytoxan, Epirubicin, 5FU x 4 cycles      07/14/2008 - 08/28/2008 Chemotherapy    Taxotere x 4 cycles      09/13/2008 Surgery    Right breast lumpectomy demonstrating no evidence of disease with 0/23 lymph nodes all consistent with changes associated with chemotherapy.      09/13/2008 Pathology Results    Atrophic breast parenchyma with changes consistent with chemotherapy treatment, 0/23 lymph nodes      10/17/2008 - 11/28/2008 Radiation Therapy    61.2 Gy to right breast and supraclavicular fossa       HISTORY OF PRESENT ILLNESS:   Erika Ramos is a 70 y.o. 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  She reports palpating a right breast mass in 2009 immediately prior to leaving for [place] trip (as a matter of fact while boarding the plane).  When she returned from her trip, she underwent her mammogram which lead to her diagnosis in 2009.  She tolerated treatment well. She notes that the mass appeared suddenly.   She does perform self-breast exams.  She denies any new abnormalities on her own exam.  She is up to date on mammography.  She is up to date on colonoscopy with her last one being in 2011.  I am not sure who performed it but the patient reports that it was a local GI physician, I suspect it was Dr. Oneida Alar.  She reports "itchy skin" which started after beginning oxybutynin and Janumet.  She reports improvement with moisturizing lotion.  She notes a history of frequent daytime urinary urgency which is why she wast started on oxybutynin.  This has been very helpful for her, BUT she has night time urinary frequency which hinders her ability to get a restful nights sleep.    She also notes some dizziness when looking upward for 15 seconds or more.  This does resolve spontaneously.  She has seen her primary care provider, cardiologist, and eye doctor within the last 8 weeks. She notes that she  keeps up on her well care.   Review of Systems  Constitutional: Negative.  Negative for chills, fever and weight loss.  HENT: Negative.   Eyes: Negative.  Negative for blurred vision and double vision.  Respiratory: Negative.  Negative for cough.   Cardiovascular: Negative.  Negative for chest pain.  Gastrointestinal: Negative.  Negative for constipation, diarrhea, nausea and vomiting.  Genitourinary: Negative.   Musculoskeletal: Negative.   Skin: Positive for itching.  Neurological: Positive for dizziness. Negative for sensory change, focal weakness and loss  of consciousness.  Endo/Heme/Allergies: Negative.   Psychiatric/Behavioral: Negative.   14 point review of systems was performed and is negative except as detailed under history of present illness and above    PAST MEDICAL HISTORY:   Past Medical History:  Diagnosis Date  . Breast cancer ([place])   . Breast cancer, stage 2, right ([place]) 09/06/2009   Qualifier: Diagnosis of  By: Orville Govern CMA, Carol    . Coronary artery disease   . Diabetes mellitus without complication ([place])   . Fatty liver disease, nonalcoholic   . GERD (gastroesophageal reflux disease)   . Hypertension   . PONV (postoperative nausea and vomiting)    Nausea/ Vomitting- last time 2010  . Stented coronary artery     ALLERGIES: Allergies  Allergen Reactions  . Clopidogrel Bisulfate     REACTION: hives      MEDICATIONS: I have reviewed the patient's current medications.    Current Outpatient Prescriptions on File Prior to Visit  Medication Sig Dispense Refill  . aspirin EC 81 MG tablet Take 81 mg by mouth daily.     . Calcium-Vitamin D-Vitamin K [id number] MG-UNT-MCG CHEW Chew by mouth daily.    . metFORMIN (GLUCOPHAGE) 500 MG tablet Take 2,000 mg by mouth daily with breakfast.     . Multiple Vitamins-Minerals (CENTRUM SILVER ULTRA WOMENS PO) Take 1 tablet by mouth daily.     Marland Kitchen omeprazole (PRILOSEC) 20 MG capsule Take 20 mg by mouth 2 (two) times a week.     . ramipril (ALTACE) 10 MG capsule Take 10 mg by mouth daily.      . simvastatin (ZOCOR) 40 MG tablet Take 40 mg by mouth daily.     No current facility-administered medications on file prior to visit.      PAST SURGICAL HISTORY Past Surgical History:  Procedure Laterality Date  . ABDOMINAL HYSTERECTOMY    . BREAST SURGERY  March 2010  . cataract  Right 09/2012  . FRACTURE SURGERY     wrist  right  . PARS PLANA VITRECTOMY  07/03/2011   Procedure: PARS PLANA VITRECTOMY WITH 25 GAUGE;  Surgeon: Hayden Pedro, MD;  Location: [place];  Service: Ophthalmology;   Laterality: Right;  MEMBRANE PEEL, HEAD SCOPE LASER, GAS  . RETINAL DETACHMENT SURGERY    . RETINAL DETACHMENT SURGERY  07/03/11   right  . TONSILLECTOMY      FAMILY HISTORY: Family History  Problem Relation Age of Onset  . Heart disease Mother   . Ovarian cancer Mother   . Stroke Father   . Diabetes Sister   . Diabetes Brother   . Diabetes Sister   . Heart disease Brother   . Cancer Brother     spine  . Anesthesia problems Neg Hx    Daughter 81 years old who is a Animal nutritionist and is healthy Son 22 years old who is healthy 3 healthy grandchildren.  SOCIAL HISTORY:  reports that she has never smoked.  She has never used smokeless tobacco. She reports that she does not drink alcohol or use drugs.  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  Social History   Social History  . Marital status: Divorced    Spouse name: N/A  . Number of children: N/A  . Years of education: N/A   Social History Main Topics  . Smoking status: Never Smoker  . Smokeless tobacco: Never Used  . Alcohol use No  . Drug use: No  . Sexual activity: Not Asked     Comment: single-2 grown children   Other Topics Concern  . None   Social History Narrative  . None    PERFORMANCE STATUS: The patient's performance status is 0 - Asymptomatic  PHYSICAL EXAM: Most Recent Vital Signs: Blood pressure (!) 157/78, pulse 75, temperature 98.1 F (36.7 C), temperature source Oral, resp. rate 16, height 5' 5.25" (1.657 m), weight 186 lb 4.8 oz (84.5 kg), SpO2 96 %. General appearance: alert, cooperative, no distress, mildly obese and unaccompanied Head: Normocephalic, without obvious abnormality, atraumatic Eyes: negative findings: lids and lashes normal and conjunctivae and sclerae normal Throat: lips, mucosa, and tongue normal;  teeth and gums normal Neck: no adenopathy, supple, symmetrical, trachea midline and thyroid not enlarged, symmetric, no tenderness/mass/nodules Lungs: clear to auscultation bilaterally and normal percussion bilaterally Breasts: negative findings: left breast without any palpable masses/nodules, skin changes, nipple/areolar changes on left, positive findings: right lumpectomy scar noted without any palpable masses or lesions, skin changes, or nipple/areolar changes Heart: regular rate and rhythm, S1, S2 normal, no murmur, click, rub or gallop Abdomen: soft, non-tender; bowel sounds normal; no masses,  no organomegaly Extremities: extremities normal, atraumatic, no cyanosis or edema Skin: Skin color, texture, turgor normal. No rashes or lesions Lymph nodes: Cervical, supraclavicular, and axillary nodes normal. Neurologic: Grossly normal  LABORATORY DATA:  No results found for this or any previous visit (from the past 48 hour(s)).    RADIOGRAPHY: No results found.     PATHOLOGY:  N/A   ASSESSMENT/PLAN:  Breast cancer, stage 2, right (H[facility]) Neoadjuvant chemotherapy Lumpectomy Adjuvant XRT Urinary incontinence  Stage II (T2N1M0) right breast cancer, TRIPLE NEGATIVE, S/P neoadjuvant chemotherapy consisting of FEC x 4 cycles (05/12/2008- 06/30/2008) followed by Taxotere x 4 cycles (07/14/2008- 08/28/2008).  She then went on to have right breast lumpectomy which demonstrated no residual disease.  She completed her cancer therapy with XRT (10/17/2008- 11/28/2008).  Oncology history updated.  We will try to get labs from Dr. Delanna Ahmadi office for the past year.  She is up to date on mammogram with last one being performed in November 2017 which was negative.  I personally reviewed and went over radiographic studies with the patient.  The results are noted within this dictation.    Order is placed for mammogram next year.  She is up to date on colonoscopy with last one being in 2011.  She is  advised to follow-up with primary care provider and/or Gyn for her urinary frequency at [facility] which is inhibiting her ability to sleep restfully.  Return in 12 months for follow-up.  NCCN guidelines recommends the following surveillance for invasive breast cancer (2.2017):  A. History and Physical exam 1-4 times per year as clinically appropriate for 5 years, then annually.  B. Periodic screening for changes in family history and referral to genetics counseling as indicated  C. Educate, monitor, and refer to lymphedema management.  D. Mammography every 12 months  E. Routine imaging of reconstructed breast is not indicated.  F. In the absence of clinical signs and symptoms suggestive of recurrent disease, there is no indication for laboratory or imaging studies for metastases screening.  G. Women on Tamoxifen: annual gynecologic assessment every 12 months if uterus is present.  H. Women on aromatase inhibitor or who experience ovarian failure secondary to treatment should have monitoring of bone health with a bone mineral density determination at baseline and periodically thereafter.  I. Assess and encourage adherence to adjuvant endocrine therapy.  J. Evidence suggests that active lifestyle, healthy diet, limited alcohol intake, and achieving and maintaining an ideal body weight (20-25 BMI) may lead to optimal breast cancer outcomes.   ORDERS PLACED FOR THIS ENCOUNTER: Orders Placed This Encounter  Procedures  . MM SCREENING BREAST TOMO BILATERAL    MEDICATIONS PRESCRIBED THIS ENCOUNTER: Meds ordered this encounter  Medications  . JANUMET 50-1000 MG tablet  . oxybutynin (DITROPAN) 5 MG tablet    All questions were answered. The patient knows to call the clinic with any problems, questions or concerns. We can certainly see the patient much sooner if necessary.  This note is electronically signed by.Molli Hazard, MD  :06/18/2016 5:32 PM

## 2016-06-19 ENCOUNTER — Encounter (HOSPITAL_COMMUNITY): Payer: Self-pay | Admitting: Oncology

## 2016-07-02 DIAGNOSIS — Z23 Encounter for immunization: Secondary | ICD-10-CM | POA: Diagnosis not present

## 2016-07-23 ENCOUNTER — Ambulatory Visit (HOSPITAL_COMMUNITY): Payer: Medicare Other

## 2016-09-17 DIAGNOSIS — Z1389 Encounter for screening for other disorder: Secondary | ICD-10-CM | POA: Diagnosis not present

## 2016-09-17 DIAGNOSIS — I10 Essential (primary) hypertension: Secondary | ICD-10-CM | POA: Diagnosis not present

## 2016-09-17 DIAGNOSIS — E6609 Other obesity due to excess calories: Secondary | ICD-10-CM | POA: Diagnosis not present

## 2016-09-17 DIAGNOSIS — E1165 Type 2 diabetes mellitus with hyperglycemia: Secondary | ICD-10-CM | POA: Diagnosis not present

## 2016-09-17 DIAGNOSIS — I2510 Atherosclerotic heart disease of native coronary artery without angina pectoris: Secondary | ICD-10-CM | POA: Diagnosis not present

## 2016-09-17 DIAGNOSIS — Z6832 Body mass index (BMI) 32.0-32.9, adult: Secondary | ICD-10-CM | POA: Diagnosis not present

## 2016-12-03 DIAGNOSIS — H26491 Other secondary cataract, right eye: Secondary | ICD-10-CM | POA: Diagnosis not present

## 2016-12-03 DIAGNOSIS — H35441 Age-related reticular degeneration of retina, right eye: Secondary | ICD-10-CM | POA: Diagnosis not present

## 2016-12-03 DIAGNOSIS — Z961 Presence of intraocular lens: Secondary | ICD-10-CM | POA: Diagnosis not present

## 2016-12-03 DIAGNOSIS — H33101 Unspecified retinoschisis, right eye: Secondary | ICD-10-CM | POA: Diagnosis not present

## 2016-12-03 DIAGNOSIS — H25812 Combined forms of age-related cataract, left eye: Secondary | ICD-10-CM | POA: Diagnosis not present

## 2016-12-03 DIAGNOSIS — H52223 Regular astigmatism, bilateral: Secondary | ICD-10-CM | POA: Diagnosis not present

## 2016-12-03 DIAGNOSIS — H5213 Myopia, bilateral: Secondary | ICD-10-CM | POA: Diagnosis not present

## 2016-12-05 DIAGNOSIS — Z23 Encounter for immunization: Secondary | ICD-10-CM | POA: Diagnosis not present

## 2016-12-12 DIAGNOSIS — H26491 Other secondary cataract, right eye: Secondary | ICD-10-CM | POA: Diagnosis not present

## 2017-01-01 DIAGNOSIS — H5211 Myopia, right eye: Secondary | ICD-10-CM | POA: Diagnosis not present

## 2017-01-01 DIAGNOSIS — Z961 Presence of intraocular lens: Secondary | ICD-10-CM | POA: Diagnosis not present

## 2017-01-01 DIAGNOSIS — H52221 Regular astigmatism, right eye: Secondary | ICD-10-CM | POA: Diagnosis not present

## 2017-01-07 DIAGNOSIS — B354 Tinea corporis: Secondary | ICD-10-CM | POA: Diagnosis not present

## 2017-01-07 DIAGNOSIS — I10 Essential (primary) hypertension: Secondary | ICD-10-CM | POA: Diagnosis not present

## 2017-01-07 DIAGNOSIS — Z6833 Body mass index (BMI) 33.0-33.9, adult: Secondary | ICD-10-CM | POA: Diagnosis not present

## 2017-01-07 DIAGNOSIS — K219 Gastro-esophageal reflux disease without esophagitis: Secondary | ICD-10-CM | POA: Diagnosis not present

## 2017-01-07 DIAGNOSIS — E669 Obesity, unspecified: Secondary | ICD-10-CM | POA: Diagnosis not present

## 2017-01-07 DIAGNOSIS — Z1389 Encounter for screening for other disorder: Secondary | ICD-10-CM | POA: Diagnosis not present

## 2017-01-07 DIAGNOSIS — E118 Type 2 diabetes mellitus with unspecified complications: Secondary | ICD-10-CM | POA: Diagnosis not present

## 2017-03-19 DIAGNOSIS — Z6833 Body mass index (BMI) 33.0-33.9, adult: Secondary | ICD-10-CM | POA: Diagnosis not present

## 2017-03-19 DIAGNOSIS — E6609 Other obesity due to excess calories: Secondary | ICD-10-CM | POA: Diagnosis not present

## 2017-03-19 DIAGNOSIS — J069 Acute upper respiratory infection, unspecified: Secondary | ICD-10-CM | POA: Diagnosis not present

## 2017-03-23 NOTE — Progress Notes (Signed)
Cardiology Office Note   Date:  03/30/2017   ID:  Erika Ramos, Erika Ramos January 04, 1946, MRN [id number]  PCP:  Sharilyn Sites, MD  Cardiologist:   Jenkins Rouge, MD   No chief complaint on file.     History of Present Illness: Erika Ramos is a 71 y.o. female who presents for f/u of HTN , DM and CAD Previously seen by Dr Verl Blalock In 2011 and more recently by Dr Aundra Dubin in 2014  Had PCI of RCA with stent  back in 2001  Echo 2011 with normal EF 60-65% Cardiolie June 2011 with small apical defect no ischemia EF 69%  In interim she was Rx for breast cancer with lumpectomy chemo and radiation.   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 Breast cancer ([place])   . Breast cancer, stage 2, right ([place]) 09/06/2009   Qualifier: Diagnosis of  By: Orville Govern CMA, Carol    . Coronary artery disease   . Diabetes mellitus without complication ([place])   . Fatty liver disease, nonalcoholic   . GERD (gastroesophageal reflux disease)   . Hypertension   . PONV (postoperative nausea and vomiting)    Nausea/ Vomitting- last time 2010  . Stented coronary artery     Past Surgical History:  Procedure Laterality Date  . ABDOMINAL HYSTERECTOMY    . BREAST SURGERY  March 2010  . cataract  Right 09/2012  . FRACTURE SURGERY     wrist  right  . PARS PLANA VITRECTOMY  07/03/2011   Procedure: PARS PLANA VITRECTOMY WITH 25 GAUGE;  Surgeon: Hayden Pedro, MD;  Location: [place];  Service: Ophthalmology;  Laterality: Right;  MEMBRANE PEEL, HEAD SCOPE LASER, GAS  . RETINAL DETACHMENT SURGERY    . RETINAL DETACHMENT SURGERY  07/03/11   right  . TONSILLECTOMY       Current Outpatient Prescriptions    Medication Sig Dispense Refill  . aspirin EC 81 MG tablet Take 81 mg by mouth daily.     . Calcium-Vitamin D-Vitamin K [id number] MG-UNT-MCG CHEW Chew by mouth daily.    Marland Kitchen JANUMET 50-1000 MG tablet     . Multiple Vitamins-Minerals (CENTRUM SILVER ULTRA WOMENS PO) Take 1 tablet by mouth daily.     Marland Kitchen omeprazole (PRILOSEC) 20 MG capsule Take 20 mg by mouth 2 (two) times a week.     . ramipril (ALTACE) 10 MG capsule Take 10 mg by mouth daily.      . simvastatin (ZOCOR) 40 MG tablet Take 40 mg by mouth daily.     No current facility-administered medications for this visit.     Allergies:   Clopidogrel bisulfate    Social History:  The patient  reports that she has never smoked. She has never used smokeless tobacco. She reports that she does not drink alcohol or use drugs.   Family History:  The patient's family history includes Cancer in her brother; Diabetes in her brother, sister, and sister; Heart disease in her brother and mother; Ovarian cancer in her mother; Stroke in her father.    ROS:  Please see the history of present illness.  Otherwise, review of systems are positive for none.   All other systems are reviewed and negative.    PHYSICAL EXAM: VS:  BP (!) 144/74 (BP Location: Left Arm)   Pulse 98   Ht 5\' 5"  (1.651 m)   Wt 196 lb (88.9 kg)   SpO2 95%   BMI 32.62 kg/m  , BMI Body mass index is 32.62 kg/m. Affect appropriate Healthy:  appears stated age 28: normal Neck supple with no adenopathy JVP normal no bruits no thyromegaly Lungs clear with no wheezing and good diaphragmatic motion Heart:  S1/S2 no murmur, no rub, gallop or click PMI normal Abdomen: benighn, BS positve, no tenderness, no AAA no bruit.  No HSM or HJR Distal pulses intact with no bruits No edema Neuro non-focal Skin warm and dry No muscular weakness     EKG:  2014 SR rate 83  LAFB otherwise normal  04/03/16 SR old IMI no acute changes 03/30/17 SR rate 90 poor R wave  progression   Recent Labs: No results found for requested labs within last 8760 hours.    Lipid Panel No results found for: CHOL, TRIG, HDL, CHOLHDL, VLDL, LDLCALC, LDLDIRECT    Wt Readings from Last 3 Encounters:  03/30/17 196 lb (88.9 kg)  06/18/16 186 lb 4.8 oz (84.5 kg)  04/03/16 190 lb (86.2 kg)      Other studies Reviewed: Additional studies/ records that were reviewed today include: Notes / Cath Dr Fletcher Anon and [facility] [facility] Notes primary Dr Leveda Anna .    ASSESSMENT AND PLAN:  1.  CAD  Distant stent RCA 2001 non ischemic myovue 03/2016 continue medical RX  2. Chol: on statin labs with primary  3. HTN: Well controlled.  Continue current medications and low sodium Dash type diet.   4. DM: Discussed low carb diet and portion control f/u primary target A1c 6.0 5. Obesity  Discussed diet and exercise    Current medicines are reviewed at length with the patient today.  The patient does not have concerns regarding medicines.  The following changes have been made:  no change  Labs/ tests ordered today include: None    Orders Placed This Encounter  Procedures  . EKG 12-Lead     Disposition:   FU with me in a year    Signed, Jenkins Rouge, MD  03/30/2017 12:42 PM    [facility] [place], [place], [place]  [address] Phone: [phone]; Fax: [phone]

## 2017-03-30 ENCOUNTER — Encounter: Payer: Self-pay | Admitting: Cardiovascular Disease

## 2017-03-30 ENCOUNTER — Ambulatory Visit (INDEPENDENT_AMBULATORY_CARE_PROVIDER_SITE_OTHER): Payer: Medicare HMO | Admitting: Cardiovascular Disease

## 2017-03-30 VITALS — BP 144/74 | HR 98 | Ht 65.0 in | Wt 196.0 lb

## 2017-03-30 DIAGNOSIS — I10 Essential (primary) hypertension: Secondary | ICD-10-CM

## 2017-03-30 NOTE — Patient Instructions (Signed)

## 2017-04-03 ENCOUNTER — Ambulatory Visit: Payer: Medicare Other | Admitting: Cardiovascular Disease

## 2017-05-20 DIAGNOSIS — J069 Acute upper respiratory infection, unspecified: Secondary | ICD-10-CM | POA: Diagnosis not present

## 2017-05-20 DIAGNOSIS — Z23 Encounter for immunization: Secondary | ICD-10-CM | POA: Diagnosis not present

## 2017-05-20 DIAGNOSIS — Z6834 Body mass index (BMI) 34.0-34.9, adult: Secondary | ICD-10-CM | POA: Diagnosis not present

## 2017-05-20 DIAGNOSIS — Z1389 Encounter for screening for other disorder: Secondary | ICD-10-CM | POA: Diagnosis not present

## 2017-05-20 DIAGNOSIS — J0100 Acute maxillary sinusitis, unspecified: Secondary | ICD-10-CM | POA: Diagnosis not present

## 2017-05-20 DIAGNOSIS — E6609 Other obesity due to excess calories: Secondary | ICD-10-CM | POA: Diagnosis not present

## 2017-05-21 ENCOUNTER — Ambulatory Visit (HOSPITAL_COMMUNITY)
Admission: RE | Admit: 2017-05-21 | Discharge: 2017-05-21 | Disposition: A | Discharge status: Home / Self Care | Payer: Medicare HMO | Source: Ambulatory Visit | Attending: Oncology | Admitting: Oncology

## 2017-05-21 ENCOUNTER — Encounter (HOSPITAL_COMMUNITY): Payer: Self-pay

## 2017-05-21 DIAGNOSIS — C50911 Malignant neoplasm of unspecified site of right female breast: Secondary | ICD-10-CM

## 2017-05-21 DIAGNOSIS — Z1231 Encounter for screening mammogram for malignant neoplasm of breast: Secondary | ICD-10-CM | POA: Diagnosis not present

## 2017-06-04 DIAGNOSIS — E6609 Other obesity due to excess calories: Secondary | ICD-10-CM | POA: Diagnosis not present

## 2017-06-04 DIAGNOSIS — Z6833 Body mass index (BMI) 33.0-33.9, adult: Secondary | ICD-10-CM | POA: Diagnosis not present

## 2017-06-04 DIAGNOSIS — J0100 Acute maxillary sinusitis, unspecified: Secondary | ICD-10-CM | POA: Diagnosis not present

## 2017-06-04 DIAGNOSIS — J029 Acute pharyngitis, unspecified: Secondary | ICD-10-CM | POA: Diagnosis not present

## 2017-06-22 ENCOUNTER — Ambulatory Visit (HOSPITAL_COMMUNITY): Payer: Medicare Other | Admitting: Hematology and Oncology

## 2017-06-25 ENCOUNTER — Ambulatory Visit (HOSPITAL_COMMUNITY): Payer: Medicare Other

## 2017-06-29 DIAGNOSIS — Z6832 Body mass index (BMI) 32.0-32.9, adult: Secondary | ICD-10-CM | POA: Diagnosis not present

## 2017-06-29 DIAGNOSIS — E118 Type 2 diabetes mellitus with unspecified complications: Secondary | ICD-10-CM | POA: Diagnosis not present

## 2017-06-29 DIAGNOSIS — N342 Other urethritis: Secondary | ICD-10-CM | POA: Diagnosis not present

## 2017-06-29 DIAGNOSIS — K219 Gastro-esophageal reflux disease without esophagitis: Secondary | ICD-10-CM | POA: Diagnosis not present

## 2017-06-29 DIAGNOSIS — Z0001 Encounter for general adult medical examination with abnormal findings: Secondary | ICD-10-CM | POA: Diagnosis not present

## 2017-06-29 DIAGNOSIS — Z1389 Encounter for screening for other disorder: Secondary | ICD-10-CM | POA: Diagnosis not present

## 2017-06-29 DIAGNOSIS — E782 Mixed hyperlipidemia: Secondary | ICD-10-CM | POA: Diagnosis not present

## 2017-06-29 DIAGNOSIS — E6609 Other obesity due to excess calories: Secondary | ICD-10-CM | POA: Diagnosis not present

## 2017-06-29 DIAGNOSIS — E785 Hyperlipidemia, unspecified: Secondary | ICD-10-CM | POA: Diagnosis not present

## 2017-06-29 DIAGNOSIS — Z0000 Encounter for general adult medical examination without abnormal findings: Secondary | ICD-10-CM | POA: Diagnosis not present

## 2017-07-07 ENCOUNTER — Encounter (HOSPITAL_COMMUNITY): Payer: Self-pay | Admitting: Adult Health

## 2017-07-07 ENCOUNTER — Other Ambulatory Visit: Payer: Self-pay

## 2017-07-07 ENCOUNTER — Inpatient Hospital Stay (HOSPITAL_COMMUNITY): Payer: Medicare HMO | Attending: Adult Health | Admitting: Adult Health

## 2017-07-07 VITALS — BP 154/93 | HR 95 | Temp 97.6°F | Resp 18 | Ht 65.0 in | Wt 196.3 lb

## 2017-07-07 DIAGNOSIS — R350 Frequency of micturition: Secondary | ICD-10-CM | POA: Diagnosis not present

## 2017-07-07 DIAGNOSIS — M8580 Other specified disorders of bone density and structure, unspecified site: Secondary | ICD-10-CM

## 2017-07-07 DIAGNOSIS — C50911 Malignant neoplasm of unspecified site of right female breast: Secondary | ICD-10-CM

## 2017-07-07 DIAGNOSIS — Z853 Personal history of malignant neoplasm of breast: Secondary | ICD-10-CM

## 2017-07-07 DIAGNOSIS — K5909 Other constipation: Secondary | ICD-10-CM | POA: Insufficient documentation

## 2017-07-07 DIAGNOSIS — C50919 Malignant neoplasm of unspecified site of unspecified female breast: Secondary | ICD-10-CM

## 2017-07-07 DIAGNOSIS — R5383 Other fatigue: Secondary | ICD-10-CM | POA: Insufficient documentation

## 2017-07-07 DIAGNOSIS — Z1231 Encounter for screening mammogram for malignant neoplasm of breast: Secondary | ICD-10-CM

## 2017-07-07 NOTE — Progress Notes (Addendum)
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  Right axillary lymph node needle biopsy is positive for metastatic disease.  ER 0%, PR 0%, Ki-67 55%, Her2 negative      04/26/2008 Breast MRI    1. 1.9 x 2.2 x 2.0 cm solitary area of abnormal enhancement in the deep aspect of the upper outer quadrant of the right breast is consistent with biopsy-proven invasive mammary carcinoma.  No evidence of multicentric, multifocal or contralateral disease.   2. A single enlarged, abnormal right axillary lymph node is present and was previously biopsied, and shown to contain malignant cells.      05/12/2008 - 06/30/2008 Chemotherapy    Cytoxan, Epirubicin, 5FU x 4 cycles      07/14/2008 - 08/28/2008 Chemotherapy    Taxotere x 4 cycles      09/13/2008 Surgery    Right breast lumpectomy demonstrating no evidence of disease with 0/23 lymph nodes all consistent with changes associated with chemotherapy.      09/13/2008 Pathology Results    Atrophic breast parenchyma with changes consistent with chemotherapy treatment, 0/23 lymph nodes      10/17/2008 - 11/28/2008 Radiation Therapy    61.2 Gy to right breast and supraclavicular fossa         INTERVAL HISTORY:  [name] 72 y.o. 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   Overall, she tells me she has been feeling well.  Appetite 100%; energy level 75%.  She struggles with intermittent fatigue, which she attributes to urinary frequency at night.  Her PCP is  helping manage her urinary concerns.  She also struggles with chronic constipation, which is stable and largely unchanged.  She denies any new breast concerns including lumps, skin changes, or nipple discharge.  She recently started the [employer] program and is trying to become more physically active.  Reportedly does take calcium and vitamin D supplementation.  She saw Dr. Delanna Ahmadi team recently for her physical.  Last mammogram was in 04/2017, and was negative for malignancy.   Otherwise she is largely without complaints today.    REVIEW OF SYSTEMS:  Review of Systems  Constitutional: Positive for fatigue.  HENT:  Negative.   Eyes: Negative.   Respiratory: Negative.   Cardiovascular: Negative.   Gastrointestinal: Positive for constipation.  Genitourinary: Positive for frequency and nocturia.   Musculoskeletal: Negative.   Skin: Negative.   Neurological: Negative.   Hematological: Negative.   Psychiatric/Behavioral: Negative.      PAST MEDICAL/SURGICAL HISTORY:  Past Medical History:  Diagnosis Date  . Breast cancer ([place])   . Breast cancer, stage 2, right ([place]) 09/06/2009   Qualifier: Diagnosis of  By: Orville Govern CMA, Carol    . Coronary artery disease   . Diabetes mellitus without complication ([place])   . Fatty liver disease, nonalcoholic   . GERD (gastroesophageal reflux disease)   . Hypertension   . PONV (postoperative nausea and vomiting)    Nausea/ Vomitting- last time 2010  . Stented coronary artery    Past Surgical History:  Procedure Laterality Date  . ABDOMINAL HYSTERECTOMY    . BREAST SURGERY  March 2010  . cataract  Right 09/2012  . FRACTURE SURGERY     wrist  right  . PARS PLANA VITRECTOMY  07/03/2011   Procedure: PARS PLANA VITRECTOMY WITH 25 GAUGE;  Surgeon: Hayden Pedro, MD;  Location: [facility];  Service: Ophthalmology;  Laterality: Right;  MEMBRANE PEEL, HEAD SCOPE LASER, GAS  . RETINAL DETACHMENT SURGERY    . RETINAL DETACHMENT SURGERY  07/03/11    right  . TONSILLECTOMY       SOCIAL HISTORY:  Social History   Socioeconomic History  . Marital status: Divorced    Spouse name: Not on file  . Number of children: Not on file  . Years of education: Not on file  . Highest education level: Not on file  Social Needs  . Financial resource strain: Not on file  . Food insecurity - worry: Not on file  . Food insecurity - inability: Not on file  . Transportation needs - medical: Not on file  . Transportation needs - non-medical: Not on file  Occupational History  . Not on file  Tobacco Use  . Smoking status: Never Smoker  . Smokeless tobacco: Never Used  Substance and Sexual Activity  . Alcohol use: No  . Drug use: No  . Sexual activity: Not on file    Comment: single-2 grown children  Other Topics Concern  . Not on file  Social History Narrative  . Not on file    FAMILY HISTORY:  Family History  Problem Relation Age of Onset  . Heart disease Mother   . Ovarian cancer Mother   . Stroke Father   . Diabetes Sister   . Diabetes Brother   . Diabetes Sister   . Heart disease Brother   . Cancer Brother        spine  . Anesthesia problems Neg Hx     CURRENT MEDICATIONS:  Outpatient Encounter Medications as of 07/07/2017  Medication Sig Note  . oxybutynin (DITROPAN) 5 MG tablet Take 5 mg by mouth 2 (two) times daily.   Marland Kitchen aspirin EC 81 MG tablet Take 81 mg by mouth daily.    . Calcium-Vitamin D-Vitamin K 884-166-06 MG-UNT-MCG CHEW Chew by mouth daily.   Marland Kitchen JANUMET 50-1000 MG tablet  06/18/2016: Received from: External Pharmacy  . Multiple Vitamins-Minerals (CENTRUM SILVER ULTRA WOMENS PO) Take 1 tablet by mouth daily.    Marland Kitchen omeprazole (PRILOSEC) 20 MG capsule Take 20 mg by mouth 2 (two) times a week.    . ramipril (ALTACE) 10 MG capsule Take 10 mg by mouth daily.     . simvastatin (ZOCOR) 40 MG tablet Take 40 mg by mouth daily.    No facility-administered encounter medications on file as of 07/07/2017.     ALLERGIES:    Allergies  Allergen Reactions  . Clopidogrel Bisulfate     REACTION: hives     PHYSICAL EXAM:  ECOG Performance status: 0 - Asymptomatic   Vitals:   07/07/17 1147  BP: (!) 154/93  Pulse: 95  Resp: 18  Temp: 97.6 F (36.4 C)  SpO2: 95%   Filed Weights   07/07/17 1147  Weight: 196 lb 4.8 oz (89 kg)    Physical Exam  Constitutional: She is oriented to person, place, and time and well-developed, well-nourished, and in no distress.  HENT:  Head: Normocephalic.  Mouth/Throat: Oropharynx is clear and moist. No oropharyngeal exudate.  Eyes: Conjunctivae  are normal. Pupils are equal, round, and reactive to light. No scleral icterus.  Neck: Normal range of motion. Neck supple.  Cardiovascular: Normal rate and regular rhythm.  Pulmonary/Chest: Effort normal and breath sounds normal. No respiratory distress. She has no wheezes.    Abdominal: Soft. Bowel sounds are normal. There is no tenderness.  Musculoskeletal: Normal range of motion. She exhibits no edema.  Lymphadenopathy:    She has no cervical adenopathy.       Right: No supraclavicular adenopathy present.       Left: No supraclavicular adenopathy present.  Neurological: She is alert and oriented to person, place, and time. No cranial nerve deficit. Gait normal.  Skin: Skin is warm and dry. No rash noted.  Psychiatric: Mood, memory, affect and judgment normal.  Nursing note and vitals reviewed.    LABORATORY DATA:  I have reviewed the labs as listed.  CBC    Component Value Date/Time   WBC 10.0 05/27/2013 1607   RBC 5.14 (H) 05/27/2013 1607   HGB 14.8 05/27/2013 1607   HCT 44.1 05/27/2013 1607   PLT 228 05/27/2013 1607   MCV 85.8 05/27/2013 1607   MCH 28.8 05/27/2013 1607   MCHC 33.6 05/27/2013 1607   RDW 12.9 05/27/2013 1607   LYMPHSABS 3.0 05/27/2013 1607   MONOABS 0.6 05/27/2013 1607   EOSABS 0.1 05/27/2013 1607   BASOSABS 0.0 05/27/2013 1607   CMP Latest Ref Rng & Units 05/27/2013 07/16/2012 03/28/2011   Glucose 70 - 99 mg/dL 92 101(H) 86  BUN 6 - 23 mg/dL '20 13 13  '$ Creatinine 0.50 - 1.10 mg/dL 0.78 0.60 0.65  Sodium 135 - 145 mEq/L 136 138 140  Potassium 3.5 - 5.1 mEq/L 3.9 4.0 3.8  Chloride 96 - 112 mEq/L 101 103 103  CO2 19 - 32 mEq/L '23 24 25  '$ Calcium 8.4 - 10.5 mg/dL 9.7 9.5 9.9  Total Protein 6.0 - 8.3 g/dL 7.3 7.6 7.0  Total Bilirubin 0.3 - 1.2 mg/dL 0.3 0.3 0.4  Alkaline Phos 39 - 117 U/L 74 89 119(H)  AST 0 - 37 U/L 28 49(H) 70(H)  ALT 0 - 35 U/L 27 51(H) 73(H)    PENDING LABS:    DIAGNOSTIC IMAGING:  *The following radiologic images and reports have been reviewed independently and agree with below findings.  Last screening mammogram: 05/21/17 CLINICAL DATA:  Screening.  EXAM: 2D DIGITAL SCREENING BILATERAL MAMMOGRAM WITH CAD AND ADJUNCT TOMO  COMPARISON:  Previous exam(s).  ACR Breast Density Category b: There are scattered areas of fibroglandular density.  FINDINGS: There are no findings suspicious for malignancy. Images were processed with CAD.  IMPRESSION: No mammographic evidence of malignancy. A result letter of this screening mammogram will be mailed directly to the patient.  RECOMMENDATION: Screening mammogram in one year. (Code:SM-B-01Y)  BI-RADS CATEGORY  1: Negative.   Electronically Signed   By: Margarette Canada M.D.   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 Treated with neoadjuvant chemo with 5-FU/Epirubicin/Cytoxan x 4 cycles, followed by Taxotere x 4 cycles. Went on to have (R) breast lumpectomy with axillary LND, which showed no evidence of disease and 0/23 axillary lymph nodes. Completed adjuvant breast radiation from 10/17/08-11/28/08.  -Last screening bilat mammogram on 05/20/17 negative for malignancy; annual screening mammogram will be due in 04/2018; orders  placed today.  -Clinical breast exam performed today and benign. She is now 9+ years out from her original breast cancer diagnosis without evidence of recurrence; this is very favorable. We discussed the option of releasing her from follow-up here at the cancer center vs continued observation with annual visits. She prefers to be seen at the cancer center, which is certainly reasonable and we are happy to accommodate.  To optimize her follow-up a bit better, will plan on her having annual mammogram in Nov/Dec each year and see [place] in May or June each year. This way, she is having either physical exam or imaging of her breasts every 6 months.  Of course, if she has any new/concerning symptoms or changes in her breasts, then gave her strict instructions to call us and we could see her sooner if needed. She is agreeable to this plan.  -Return to cancer center in May/June 2020. No labs needed; she follows with PCP who routinely collects labs.   Bone health:  -Last DEXA scan that we have records for was in 10/2012 and showed osteopenia with T-score -1.5.  -Given that she does not require anti-estrogen therapy that could further weaken bone density, I will defer any future bone density imaging to her PCP as they deem clinically appropriate.  -Recommended she continue calcium/vitamin D supplementation and increase weight-bearing exercises as tolerated.   Health maintenance/Wellness promotion:  -Commended her efforts in participating in the [employer] program. She is planning on joining [employer] in the future as well to work on her diet.  Encouraged her to continue to keep up the good work in these efforts.  -Encouraged continued follow-up with PCP and other specialists as they deem appropriate.        Dispo:  -Annual screening mammogram due in 04/2018; orders placed today.  -Return to cancer center in May/June 2020; no labs needed.    All questions were answered to patient's stated  satisfaction. Encouraged patient to call with any new concerns or questions before her next visit to the cancer center and we can certain see her sooner, if needed.      Orders placed this encounter:  Orders Placed This Encounter  Procedures  . MM SCREENING BREAST TOMO BILATERAL      Mike Craze, NP [place] [phone]

## 2017-07-17 ENCOUNTER — Encounter (HOSPITAL_COMMUNITY): Payer: Self-pay | Admitting: *Deleted

## 2017-07-17 ENCOUNTER — Emergency Department (HOSPITAL_COMMUNITY)
Admission: EM | Admit: 2017-07-17 | Discharge: 2017-07-18 | Disposition: A | Discharge status: Home / Self Care | Payer: Medicare HMO | Attending: Emergency Medicine | Admitting: Emergency Medicine

## 2017-07-17 ENCOUNTER — Emergency Department (HOSPITAL_COMMUNITY): Payer: Medicare HMO

## 2017-07-17 ENCOUNTER — Other Ambulatory Visit: Payer: Self-pay

## 2017-07-17 DIAGNOSIS — E119 Type 2 diabetes mellitus without complications: Secondary | ICD-10-CM | POA: Insufficient documentation

## 2017-07-17 DIAGNOSIS — I2510 Atherosclerotic heart disease of native coronary artery without angina pectoris: Secondary | ICD-10-CM | POA: Diagnosis not present

## 2017-07-17 DIAGNOSIS — R51 Headache: Secondary | ICD-10-CM | POA: Insufficient documentation

## 2017-07-17 DIAGNOSIS — R519 Headache, unspecified: Secondary | ICD-10-CM

## 2017-07-17 DIAGNOSIS — Z955 Presence of coronary angioplasty implant and graft: Secondary | ICD-10-CM | POA: Diagnosis not present

## 2017-07-17 DIAGNOSIS — I10 Essential (primary) hypertension: Secondary | ICD-10-CM | POA: Insufficient documentation

## 2017-07-17 DIAGNOSIS — R531 Weakness: Secondary | ICD-10-CM | POA: Diagnosis not present

## 2017-07-17 DIAGNOSIS — R112 Nausea with vomiting, unspecified: Secondary | ICD-10-CM | POA: Diagnosis not present

## 2017-07-17 DIAGNOSIS — Z853 Personal history of malignant neoplasm of breast: Secondary | ICD-10-CM | POA: Insufficient documentation

## 2017-07-17 DIAGNOSIS — Z79899 Other long term (current) drug therapy: Secondary | ICD-10-CM | POA: Insufficient documentation

## 2017-07-17 DIAGNOSIS — R202 Paresthesia of skin: Secondary | ICD-10-CM | POA: Insufficient documentation

## 2017-07-17 DIAGNOSIS — R4182 Altered mental status, unspecified: Secondary | ICD-10-CM | POA: Diagnosis not present

## 2017-07-17 DIAGNOSIS — Z7982 Long term (current) use of aspirin: Secondary | ICD-10-CM | POA: Insufficient documentation

## 2017-07-17 LAB — COMPREHENSIVE METABOLIC PANEL
ALT: 41 U/L (ref 14–54)
ANION GAP: 17 — AB (ref 5–15)
AST: 53 U/L — ABNORMAL HIGH (ref 15–41)
Albumin: 4.4 g/dL (ref 3.5–5.0)
Alkaline Phosphatase: 61 U/L (ref 38–126)
BILIRUBIN TOTAL: 0.5 mg/dL (ref 0.3–1.2)
BUN: 22 mg/dL — ABNORMAL HIGH (ref 6–20)
CO2: 24 mmol/L (ref 22–32)
CREATININE: 1.2 mg/dL — AB (ref 0.44–1.00)
Calcium: 10 mg/dL (ref 8.9–10.3)
Chloride: 96 mmol/L — ABNORMAL LOW (ref 101–111)
GFR calc Af Amer: 52 mL/min — ABNORMAL LOW (ref >60)
GFR, EST NON AFRICAN AMERICAN: 45 mL/min — AB (ref >60)
Glucose, Bld: 146 mg/dL — ABNORMAL HIGH (ref 65–99)
Potassium: 3.8 mmol/L (ref 3.5–5.1)
Sodium: 137 mmol/L (ref 135–145)
TOTAL PROTEIN: 8.4 g/dL — AB (ref 6.5–8.1)

## 2017-07-17 LAB — CBC WITH DIFFERENTIAL/PLATELET
Basophils Absolute: 0 10*3/uL (ref 0.0–0.1)
Basophils Relative: 0 %
EOS ABS: 0.3 10*3/uL (ref 0.0–0.7)
Eosinophils Relative: 3 %
HCT: 47.1 % — ABNORMAL HIGH (ref 36.0–46.0)
HEMOGLOBIN: 15.1 g/dL — AB (ref 12.0–15.0)
Lymphocytes Relative: 29 %
Lymphs Abs: 3.2 10*3/uL (ref 0.7–4.0)
MCH: 27.7 pg (ref 26.0–34.0)
MCHC: 32.1 g/dL (ref 30.0–36.0)
MCV: 86.4 fL (ref 78.0–100.0)
Monocytes Absolute: 0.9 10*3/uL (ref 0.1–1.0)
Monocytes Relative: 9 %
NEUTROS PCT: 59 %
Neutro Abs: 6.6 10*3/uL (ref 1.7–7.7)
Platelets: 214 10*3/uL (ref 150–400)
RBC: 5.45 MIL/uL — ABNORMAL HIGH (ref 3.87–5.11)
RDW: 13 % (ref 11.5–15.5)
WBC: 11 10*3/uL — ABNORMAL HIGH (ref 4.0–10.5)

## 2017-07-17 MED ORDER — MORPHINE SULFATE (PF) 2 MG/ML IV SOLN
2.0000 mg | Freq: Once | INTRAVENOUS | Status: AC
  Administered 2017-07-17: 2 mg via INTRAVENOUS
  Filled 2017-07-17: qty 1

## 2017-07-17 MED ORDER — SODIUM CHLORIDE 0.9 % IV BOLUS (SEPSIS)
500.0000 mL | Freq: Once | INTRAVENOUS | Status: AC
  Administered 2017-07-17: 500 mL via INTRAVENOUS

## 2017-07-17 MED ORDER — ONDANSETRON HCL 4 MG/2ML IJ SOLN
4.0000 mg | Freq: Once | INTRAMUSCULAR | Status: AC
  Administered 2017-07-17: 4 mg via INTRAVENOUS
  Filled 2017-07-17: qty 2

## 2017-07-17 NOTE — ED Provider Notes (Signed)
[facility] EMERGENCY DEPARTMENT Provider Note   CSN: [id number] Arrival date & time: 07/17/17  2051     History   Chief Complaint Chief Complaint  Patient presents with  . Headache    HPI Erika TISDELL is a 72 y.o. female.  HPI 72 year old female history of breast cancer, diabetes, coronary artery disease presents tonight complaining of headache.  She states she has had a headache for the past 2-3 weeks.  It waxes and wanes.  She has taken over-the-counter medications without relief.  Tonight her headache worsened.  She became generally weak and had associated nausea and vomiting.  She took 500 mg of Tylenol without relief.  She describes the pain as bitemporal pressure and severe 10 out of 10.  This evening she experienced some bilateral finger tingling.  Some family thought that she was confused but did not note any lateralized deficits.  She denies that she had a confusion and is able to relate the entire experience.  She is not currently on any anticoagulation.  She denies any fever or neck pain.  She denies any respiratory infection symptoms. Past Medical History:  Diagnosis Date  . Breast cancer ([place])   . Breast cancer, stage 2, right ([place]) 09/06/2009   Qualifier: Diagnosis of  By: Orville Govern CMA, Carol    . Coronary artery disease   . Diabetes mellitus without complication ([place])   . Fatty liver disease, nonalcoholic   . GERD (gastroesophageal reflux disease)   . Hypertension   . PONV (postoperative nausea and vomiting)    Nausea/ Vomitting- last time 2010  . Stented coronary artery     Patient Active Problem List   Diagnosis Date Noted  . Vitiligo 02/28/2015  . Obesity (BMI 30-39.9) 08/23/2014  . Epiretinal membrane, right eye 06/25/2011  . GERD 12/03/2009  . HEMATOCHEZIA 12/03/2009  . EDEMA 09/12/2009  . DYSPNEA 09/12/2009  . Breast cancer, stage 2, right ([place]) 09/06/2009  . HYPERLIPIDEMIA 09/06/2009  . HYPERTENSION 09/06/2009  . CAD, NATIVE VESSEL 09/06/2009      Past Surgical History:  Procedure Laterality Date  . ABDOMINAL HYSTERECTOMY    . BREAST SURGERY  March 2010  . cataract  Right 09/2012  . FRACTURE SURGERY     wrist  right  . PARS PLANA VITRECTOMY  07/03/2011   Procedure: PARS PLANA VITRECTOMY WITH 25 GAUGE;  Surgeon: Hayden Pedro, MD;  Location: [facility];  Service: Ophthalmology;  Laterality: Right;  MEMBRANE PEEL, HEAD SCOPE LASER, GAS  . RETINAL DETACHMENT SURGERY    . RETINAL DETACHMENT SURGERY  07/03/11   right  . TONSILLECTOMY      OB History    No data available       Home Medications    Prior to Admission medications   Medication Sig Start Date End Date Taking? Authorizing Provider  aspirin EC 81 MG tablet Take 81 mg by mouth daily.    Yes [provider]  Calcium-Vitamin D-Vitamin K 010-932-35 MG-UNT-MCG CHEW Chew 1 each by mouth daily.    Yes [provider]  JANUMET 50-1000 MG tablet Take 1 tablet by mouth 2 (two) times daily with a meal.  05/19/16  Yes [provider]  Multiple Vitamins-Minerals (CENTRUM SILVER ULTRA WOMENS PO) Take 1 tablet by mouth daily.    Yes [provider]  omeprazole (PRILOSEC) 20 MG capsule Take 20 mg by mouth 2 (two) times a week.    Yes [provider]  oxybutynin (DITROPAN) 5 MG tablet Take  5 mg by mouth 3 (three) times daily.    Yes [provider]  ramipril (ALTACE) 10 MG capsule Take 10 mg by mouth daily.     Yes [provider]  simvastatin (ZOCOR) 40 MG tablet Take 40 mg by mouth daily.   Yes [provider]    Family History Family History  Problem Relation Age of Onset  . Heart disease Mother   . Ovarian cancer Mother   . Stroke Father   . Diabetes Sister   . Diabetes Brother   . Diabetes Sister   . Heart disease Brother   . Cancer Brother        spine  . Anesthesia problems Neg Hx     Social History Social History   Tobacco Use  . Smoking status: Never Smoker  . Smokeless tobacco: Never  Used  Substance Use Topics  . Alcohol use: No  . Drug use: No     Allergies   Clopidogrel bisulfate and Oxycontin [oxycodone hcl]   Review of Systems Review of Systems  All other systems reviewed and are negative.    Physical Exam Updated Vital Signs BP (!) 146/92 (BP Location: Left Arm)   Pulse 98   Temp 98.2 F (36.8 C) (Oral)   Resp 16   Ht 1.651 m (5\' 5" )   Wt 87.1 kg (192 lb)   SpO2 98%   BMI 31.95 kg/m   Physical Exam  Constitutional: She is oriented to person, place, and time. She appears well-developed and well-nourished.  HENT:  Head: Normocephalic and atraumatic.  Mouth/Throat: Oropharynx is clear and moist.  Eyes: EOM are normal. Pupils are equal, round, and reactive to light.  Neck: Normal range of motion. Neck supple.  Cardiovascular: Normal rate, regular rhythm, normal heart sounds and intact distal pulses.  Pulmonary/Chest: Effort normal and breath sounds normal.  Abdominal: Soft. Bowel sounds are normal.  Musculoskeletal: Normal range of motion.  Neurological: She is alert and oriented to person, place, and time. She has normal strength. She displays normal reflexes. No cranial nerve deficit or sensory deficit. She displays a negative Romberg sign.  Skin: Skin is warm and dry. Capillary refill takes less than 2 seconds.  Psychiatric: She has a normal mood and affect. Her behavior is normal.  Nursing note and vitals reviewed.    ED Treatments / Results  Labs (all labs ordered are listed, but only abnormal results are displayed) Labs Reviewed  CBC WITH DIFFERENTIAL/PLATELET - Abnormal; Notable for the following components:      Result Value   WBC 11.0 (*)    RBC 5.45 (*)    Hemoglobin 15.1 (*)    HCT 47.1 (*)    All other components within normal limits  COMPREHENSIVE METABOLIC PANEL - Abnormal; Notable for the following components:   Chloride 96 (*)    Glucose, Bld 146 (*)    BUN 22 (*)    Creatinine, Ser 1.20 (*)    Total Protein 8.4 (*)     AST 53 (*)    GFR calc non Af Amer 45 (*)    GFR calc Af Amer 52 (*)    Anion gap 17 (*)    All other components within normal limits  URINALYSIS, ROUTINE W REFLEX MICROSCOPIC    EKG  EKG Interpretation  Date/Time:  Friday July 17 2017 21:11:08 EST Ventricular Rate:  102 PR Interval:  144 QRS Duration: 76 QT Interval:  362 QTC Calculation: 471 R Axis:   -59  Text Interpretation:  Sinus tachycardia Possible Left atrial enlargement Left anterior fascicular block Anterolateral infarct , age undetermined Abnormal ECG Confirmed by Pattricia Boss [phone]) on 07/17/2017 9:24:53 PM       Radiology Dg Chest 1 View  Result Date: 07/17/2017 CLINICAL DATA:  Headache history of breast cancer EXAM: CHEST 1 VIEW COMPARISON:  07/03/2011 FINDINGS: Postsurgical changes in the right axilla. No acute consolidation or effusion. Borderline heart size. No pneumothorax. Mild bronchitic changes at the bases. IMPRESSION: No active disease.  Mild bronchitic changes at the bases. Electronically Signed   By: Donavan Foil M.D.   On: 07/17/2017 22:14   Ct Head Wo Contrast  Result Date: 07/17/2017 CLINICAL DATA:  Severe migraines with brief episode of altered mental status. EXAM: CT HEAD WITHOUT CONTRAST TECHNIQUE: Contiguous axial images were obtained from the base of the skull through the vertex without intravenous contrast. COMPARISON:  None. FINDINGS: Brain: No acute intracranial hemorrhage, midline shift or edema. No large vascular territory infarct. Chronic appearing small vessel ischemic disease with right-sided basal ganglial lacunar infarcts. No hydrocephalus. Midline fourth ventricle and basal cisterns without effacement. No intra-axial mass or extra-axial fluid collections involutional. Mild age related involutional changes of the brain. Vascular: No hyperdense vessels. Skull: No skull fracture or suspicious osseous lesions. Sinuses/Orbits: Polypoid mucosal thickening in the left maxillary sinus,  suspicious for mucous retention cyst. Intact orbits and globes. Right lens replacement. 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 No acute intracranial abnormality. Polypoid mucosal thickening in the left maxillary sinus compatible with partial volume averaging of a mucous retention cyst. Electronically Signed   By: Ashley Royalty M.D.   On: 07/17/2017 22:12    Procedures Procedures (including critical care time)  Medications Ordered in ED Medications  sodium chloride 0.9 % bolus 500 mL (500 mLs Intravenous New Bag/Given 07/17/17 2238)  morphine 2 MG/ML injection 2 mg (not administered)  ondansetron (ZOFRAN) injection 4 mg (not administered)     Initial Impression / Assessment and Plan / ED Course  I have reviewed the triage vital signs and the nursing notes.  Pertinent labs & imaging results that were available during my care of the patient were reviewed by me and considered in my medical decision making (see chart for details).    No acute abnormalities or explanation for headache on ct.  Headache for several weeks and would expect to see changes if stroke or bleeding.  NO signs or symptoms of infection, neck supple.  His retention cyst noted.  Discussed with Dr. Benjamine Mola, ent.  He states that this would not be causing symptoms.  She has been on multiple antibiotics in December for sinusitis symptoms are improved here with pain medicine and anti-emetics.  Plan referral to neurology Discussed options with family.  She is feeling improved after pain medication.  She had some nausea and vomiting for oral pain medication but was able to take one that she was okay with.  She does not wish any narcotic pain medication.  I discussed using ibuprofen 400 mg.  She is given referral to neurology.  She will return if there is any change in her symptoms including worsening headache or neurological symptoms.  Final Clinical Impressions(s) / ED Diagnoses    Final diagnoses:  Hx of breast cancer  Headache    ED Discharge Orders    None       Pattricia Boss, MD 07/18/17 [phone]

## 2017-07-17 NOTE — ED Triage Notes (Addendum)
Pt reports having a severe migraine today with n/v and states that her temples are throbbing. Pt also report having several headaches the last 3 weeks but did not report this to her doctor. Pt also reports numbness in both hands (neuropathy like pains). Pt son-in-law states that the pt acted drunk when they went to get her to bring her here, as they were called by family.  Per pt's daughter, she states that the pt's AMS has cleared up but was first noticed around 2015.

## 2017-07-18 DIAGNOSIS — Z853 Personal history of malignant neoplasm of breast: Secondary | ICD-10-CM | POA: Diagnosis not present

## 2017-07-18 DIAGNOSIS — E119 Type 2 diabetes mellitus without complications: Secondary | ICD-10-CM | POA: Diagnosis not present

## 2017-07-18 DIAGNOSIS — Z7982 Long term (current) use of aspirin: Secondary | ICD-10-CM | POA: Diagnosis not present

## 2017-07-18 DIAGNOSIS — Z79899 Other long term (current) drug therapy: Secondary | ICD-10-CM | POA: Diagnosis not present

## 2017-07-18 DIAGNOSIS — Z955 Presence of coronary angioplasty implant and graft: Secondary | ICD-10-CM | POA: Diagnosis not present

## 2017-07-18 DIAGNOSIS — I2510 Atherosclerotic heart disease of native coronary artery without angina pectoris: Secondary | ICD-10-CM | POA: Diagnosis not present

## 2017-07-18 DIAGNOSIS — R51 Headache: Secondary | ICD-10-CM | POA: Diagnosis not present

## 2017-07-18 DIAGNOSIS — I10 Essential (primary) hypertension: Secondary | ICD-10-CM | POA: Diagnosis not present

## 2017-07-18 DIAGNOSIS — R202 Paresthesia of skin: Secondary | ICD-10-CM | POA: Diagnosis not present

## 2017-07-28 DIAGNOSIS — E214 Other specified disorders of parathyroid gland: Secondary | ICD-10-CM | POA: Diagnosis not present

## 2017-07-28 DIAGNOSIS — R1310 Dysphagia, unspecified: Secondary | ICD-10-CM | POA: Diagnosis not present

## 2017-07-28 DIAGNOSIS — J341 Cyst and mucocele of nose and nasal sinus: Secondary | ICD-10-CM | POA: Diagnosis not present

## 2017-07-28 DIAGNOSIS — R51 Headache: Secondary | ICD-10-CM | POA: Diagnosis not present

## 2017-07-28 DIAGNOSIS — Z6833 Body mass index (BMI) 33.0-33.9, adult: Secondary | ICD-10-CM | POA: Diagnosis not present

## 2017-07-28 DIAGNOSIS — E6609 Other obesity due to excess calories: Secondary | ICD-10-CM | POA: Diagnosis not present

## 2017-08-07 DIAGNOSIS — E214 Other specified disorders of parathyroid gland: Secondary | ICD-10-CM | POA: Diagnosis not present

## 2017-08-07 DIAGNOSIS — Z0000 Encounter for general adult medical examination without abnormal findings: Secondary | ICD-10-CM | POA: Diagnosis not present

## 2017-08-12 DIAGNOSIS — N3281 Overactive bladder: Secondary | ICD-10-CM | POA: Diagnosis not present

## 2017-08-12 DIAGNOSIS — I2510 Atherosclerotic heart disease of native coronary artery without angina pectoris: Secondary | ICD-10-CM | POA: Diagnosis not present

## 2017-08-12 DIAGNOSIS — K219 Gastro-esophageal reflux disease without esophagitis: Secondary | ICD-10-CM | POA: Diagnosis not present

## 2017-08-12 DIAGNOSIS — E119 Type 2 diabetes mellitus without complications: Secondary | ICD-10-CM | POA: Diagnosis not present

## 2017-08-12 DIAGNOSIS — E669 Obesity, unspecified: Secondary | ICD-10-CM | POA: Diagnosis not present

## 2017-08-12 DIAGNOSIS — Z7982 Long term (current) use of aspirin: Secondary | ICD-10-CM | POA: Diagnosis not present

## 2017-08-12 DIAGNOSIS — E785 Hyperlipidemia, unspecified: Secondary | ICD-10-CM | POA: Diagnosis not present

## 2017-08-12 DIAGNOSIS — Z6833 Body mass index (BMI) 33.0-33.9, adult: Secondary | ICD-10-CM | POA: Diagnosis not present

## 2017-08-12 DIAGNOSIS — I10 Essential (primary) hypertension: Secondary | ICD-10-CM | POA: Diagnosis not present

## 2017-08-12 DIAGNOSIS — R32 Unspecified urinary incontinence: Secondary | ICD-10-CM | POA: Diagnosis not present

## 2017-08-13 DIAGNOSIS — L959 Vasculitis limited to the skin, unspecified: Secondary | ICD-10-CM | POA: Diagnosis not present

## 2017-08-13 DIAGNOSIS — R42 Dizziness and giddiness: Secondary | ICD-10-CM | POA: Diagnosis not present

## 2017-08-13 DIAGNOSIS — E118 Type 2 diabetes mellitus with unspecified complications: Secondary | ICD-10-CM | POA: Diagnosis not present

## 2017-08-13 DIAGNOSIS — R569 Unspecified convulsions: Secondary | ICD-10-CM | POA: Diagnosis not present

## 2017-08-13 DIAGNOSIS — G4459 Other complicated headache syndrome: Secondary | ICD-10-CM | POA: Diagnosis not present

## 2017-08-27 DIAGNOSIS — R569 Unspecified convulsions: Secondary | ICD-10-CM | POA: Diagnosis not present

## 2017-08-27 DIAGNOSIS — G4089 Other seizures: Secondary | ICD-10-CM | POA: Diagnosis not present

## 2017-09-01 DIAGNOSIS — E041 Nontoxic single thyroid nodule: Secondary | ICD-10-CM | POA: Diagnosis not present

## 2017-09-01 DIAGNOSIS — E042 Nontoxic multinodular goiter: Secondary | ICD-10-CM | POA: Diagnosis not present

## 2017-09-03 DIAGNOSIS — Z681 Body mass index (BMI) 19 or less, adult: Secondary | ICD-10-CM | POA: Diagnosis not present

## 2017-09-03 DIAGNOSIS — J329 Chronic sinusitis, unspecified: Secondary | ICD-10-CM | POA: Diagnosis not present

## 2017-09-24 DIAGNOSIS — R569 Unspecified convulsions: Secondary | ICD-10-CM | POA: Diagnosis not present

## 2017-09-24 DIAGNOSIS — R42 Dizziness and giddiness: Secondary | ICD-10-CM | POA: Diagnosis not present

## 2017-09-24 DIAGNOSIS — E118 Type 2 diabetes mellitus with unspecified complications: Secondary | ICD-10-CM | POA: Diagnosis not present

## 2017-09-24 DIAGNOSIS — G4459 Other complicated headache syndrome: Secondary | ICD-10-CM | POA: Diagnosis not present

## 2017-10-08 ENCOUNTER — Ambulatory Visit (INDEPENDENT_AMBULATORY_CARE_PROVIDER_SITE_OTHER): Payer: Medicare HMO | Admitting: Otolaryngology

## 2017-10-08 DIAGNOSIS — J342 Deviated nasal septum: Secondary | ICD-10-CM

## 2017-10-08 DIAGNOSIS — R51 Headache: Secondary | ICD-10-CM | POA: Diagnosis not present

## 2017-10-08 DIAGNOSIS — J320 Chronic maxillary sinusitis: Secondary | ICD-10-CM

## 2017-11-26 DIAGNOSIS — Z961 Presence of intraocular lens: Secondary | ICD-10-CM | POA: Diagnosis not present

## 2017-11-26 DIAGNOSIS — H40052 Ocular hypertension, left eye: Secondary | ICD-10-CM | POA: Diagnosis not present

## 2017-11-26 DIAGNOSIS — H40012 Open angle with borderline findings, low risk, left eye: Secondary | ICD-10-CM | POA: Diagnosis not present

## 2017-11-26 DIAGNOSIS — E119 Type 2 diabetes mellitus without complications: Secondary | ICD-10-CM | POA: Diagnosis not present

## 2017-11-26 DIAGNOSIS — H52223 Regular astigmatism, bilateral: Secondary | ICD-10-CM | POA: Diagnosis not present

## 2017-11-26 DIAGNOSIS — Z7984 Long term (current) use of oral hypoglycemic drugs: Secondary | ICD-10-CM | POA: Diagnosis not present

## 2017-11-26 DIAGNOSIS — H59811 Chorioretinal scars after surgery for detachment, right eye: Secondary | ICD-10-CM | POA: Diagnosis not present

## 2017-11-26 DIAGNOSIS — H5213 Myopia, bilateral: Secondary | ICD-10-CM | POA: Diagnosis not present

## 2017-11-26 DIAGNOSIS — H25812 Combined forms of age-related cataract, left eye: Secondary | ICD-10-CM | POA: Diagnosis not present

## 2017-12-07 DIAGNOSIS — H2512 Age-related nuclear cataract, left eye: Secondary | ICD-10-CM | POA: Diagnosis not present

## 2017-12-07 DIAGNOSIS — H35371 Puckering of macula, right eye: Secondary | ICD-10-CM | POA: Diagnosis not present

## 2017-12-30 DIAGNOSIS — Z01818 Encounter for other preprocedural examination: Secondary | ICD-10-CM | POA: Diagnosis not present

## 2017-12-30 DIAGNOSIS — H2512 Age-related nuclear cataract, left eye: Secondary | ICD-10-CM | POA: Diagnosis not present

## 2018-01-01 DIAGNOSIS — Z961 Presence of intraocular lens: Secondary | ICD-10-CM | POA: Diagnosis not present

## 2018-01-01 DIAGNOSIS — H2512 Age-related nuclear cataract, left eye: Secondary | ICD-10-CM | POA: Diagnosis not present

## 2018-01-01 DIAGNOSIS — H25812 Combined forms of age-related cataract, left eye: Secondary | ICD-10-CM | POA: Diagnosis not present

## 2018-01-01 HISTORY — PX: CATARACT EXTRACTION: SUR2

## 2018-01-27 DIAGNOSIS — E118 Type 2 diabetes mellitus with unspecified complications: Secondary | ICD-10-CM | POA: Diagnosis not present

## 2018-01-27 DIAGNOSIS — E6609 Other obesity due to excess calories: Secondary | ICD-10-CM | POA: Diagnosis not present

## 2018-01-27 DIAGNOSIS — L739 Follicular disorder, unspecified: Secondary | ICD-10-CM | POA: Diagnosis not present

## 2018-01-27 DIAGNOSIS — Z1389 Encounter for screening for other disorder: Secondary | ICD-10-CM | POA: Diagnosis not present

## 2018-01-27 DIAGNOSIS — L989 Disorder of the skin and subcutaneous tissue, unspecified: Secondary | ICD-10-CM | POA: Diagnosis not present

## 2018-01-28 DIAGNOSIS — E118 Type 2 diabetes mellitus with unspecified complications: Secondary | ICD-10-CM | POA: Diagnosis not present

## 2018-02-08 DIAGNOSIS — L02821 Furuncle of head [any part, except face]: Secondary | ICD-10-CM | POA: Diagnosis not present

## 2018-02-08 DIAGNOSIS — B9689 Other specified bacterial agents as the cause of diseases classified elsewhere: Secondary | ICD-10-CM | POA: Diagnosis not present

## 2018-02-08 DIAGNOSIS — L821 Other seborrheic keratosis: Secondary | ICD-10-CM | POA: Diagnosis not present

## 2018-03-16 DIAGNOSIS — H43812 Vitreous degeneration, left eye: Secondary | ICD-10-CM | POA: Diagnosis not present

## 2018-03-16 DIAGNOSIS — H33301 Unspecified retinal break, right eye: Secondary | ICD-10-CM | POA: Diagnosis not present

## 2018-04-27 DIAGNOSIS — R69 Illness, unspecified: Secondary | ICD-10-CM | POA: Diagnosis not present

## 2018-05-13 NOTE — Progress Notes (Signed)
Cardiology Office Note   Date:  05/17/2018   ID:  Erika, Ramos 04/03/1946, MRN [id number]  PCP:  Erika Sites, MD  Cardiologist:   Erika Rouge, MD   No chief complaint on file.     History of Present Illness: Erika Ramos is a 72 y.o. female who presents for f/u of HTN , DM and CAD Previously seen by Erika Erika Ramos In 2011 and more recently by Erika Erika Ramos in 2014  Had PCI of RCA with stent  back in 2001  Echo 2011 with normal EF 60-65% Cardiolie June 2011 with small apical defect no ischemia EF 69%  In interim she was Rx for breast cancer with lumpectomy chemo and radiation.   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 She seems to be well compensated. Going to [employer] trying To work on BS A1c over 7  Past Medical History:  Diagnosis Date  . Breast cancer ([place])   . Breast cancer, stage 2, right ([place]) 09/06/2009   Qualifier: Diagnosis of  By: Erika Ramos Ramos, Erika    . Coronary artery disease   . Diabetes mellitus without complication ([place])   . Fatty liver disease, nonalcoholic   . GERD (gastroesophageal reflux disease)   . Hypertension   . PONV (postoperative nausea and vomiting)    Nausea/ Vomitting- last time 2010  . Stented coronary artery     Past Surgical History:  Procedure Laterality Date  . ABDOMINAL HYSTERECTOMY    . BREAST SURGERY  March 2010  . cataract  Right 09/2012  . CATARACT EXTRACTION  01/01/2018   left eye  . FRACTURE SURGERY     wrist  right  . PARS PLANA VITRECTOMY  07/03/2011   Procedure: PARS PLANA VITRECTOMY WITH 25 GAUGE;  Surgeon: Erika Pedro, MD;  Location: [facility];  Service: Ophthalmology;  Laterality: Right;  MEMBRANE PEEL,  HEAD SCOPE LASER, GAS  . RETINAL DETACHMENT SURGERY    . RETINAL DETACHMENT SURGERY  07/03/11   right  . TONSILLECTOMY       Current Outpatient Medications  Medication Sig Dispense Refill  . aspirin EC 81 MG tablet Take 81 mg by mouth daily.     . Calcium-Vitamin D-Vitamin K 829-937-16 MG-UNT-MCG CHEW Chew 1 each by mouth daily.     Marland Kitchen JANUMET 50-1000 MG tablet Take 1 tablet by mouth 2 (two) times daily with a meal.     . Multiple Vitamins-Minerals (CENTRUM SILVER ULTRA WOMENS PO) Take 1 tablet by mouth daily.     Marland Kitchen omeprazole (PRILOSEC) 20 MG capsule Take 20 mg by mouth 2 (two) times a week.     . ramipril (ALTACE) 10 MG capsule Take 10 mg by mouth daily.      . simvastatin (ZOCOR) 40 MG tablet Take 40 mg by mouth daily.     No current facility-administered medications for this visit.     Allergies:   Clopidogrel bisulfate and Oxycontin [oxycodone hcl]    Social History:  The patient  reports that she has never smoked. She has never used smokeless tobacco. She reports that she does not drink alcohol or use drugs.  Family History:  The patient's family history includes Cancer in her brother; Diabetes in her brother, sister, and sister; Heart disease in her brother and mother; Ovarian cancer in her mother; Stroke in her father.    ROS:  Please see the history of present illness.   Otherwise, review of systems are positive for none.   All other systems are reviewed and negative.    PHYSICAL EXAM: VS:  BP 138/78   Pulse 94   Ht 5\' 5"  (1.651 m)   Wt 193 lb (87.5 kg)   SpO2 98%   BMI 32.12 kg/m  , BMI Body mass index is 32.12 kg/m. Affect appropriate Healthy:  appears stated age 78: normal Neck supple with no adenopathy JVP normal no bruits no thyromegaly Lungs clear with no wheezing and good diaphragmatic motion Heart:  S1/S2 no murmur, no rub, gallop or click PMI normal Abdomen: benighn, BS positve, no tenderness, no AAA no bruit.  No HSM or HJR Distal pulses intact  with no bruits No edema Neuro non-focal Skin warm and dry No muscular weakness     EKG:  2014 SR rate 83  LAFB otherwise normal  04/03/16 SR old IMI no acute changes 03/30/17 SR rate 90 poor R wave progression   Recent Labs: 07/17/2017: ALT 41; BUN 22; Creatinine, Ser 1.20; Hemoglobin 15.1; Platelets 214; Potassium 3.8; Sodium 137    Lipid Panel No results found for: CHOL, TRIG, HDL, CHOLHDL, VLDL, LDLCALC, LDLDIRECT    Wt Readings from Last 3 Encounters:  05/17/18 193 lb (87.5 kg)  07/17/17 192 lb (87.1 kg)  07/07/17 196 lb 4.8 oz (89 kg)      Other studies Reviewed: Additional studies/ records that were reviewed today include: Notes / Cath Erika Ramos and Erika Ramos Notes primary Erika Erika Ramos .    ASSESSMENT AND PLAN:  1.  CAD  Distant stent RCA 2001 non ischemic myovue 03/2016 continue medical RX  2. Chol: on statin labs with primary  3. HTN: Well controlled.  Continue current medications and low sodium Dash type diet.   4. DM: Discussed low carb diet and portion control f/u primary target A1c 6.0 5. Obesity  Discussed diet and exercise    Current medicines are reviewed at length with the patient today.  The patient does not have concerns regarding medicines.  The following changes have been made:  no change  Labs/ tests ordered today include: None    No orders of the defined types were placed in this encounter.    Disposition:   FU with me in a year    Signed, Erika Rouge, MD  05/17/2018 10:44 AM    [facility] [place], [place],   [address] Phone: [phone]; Fax: [phone]

## 2018-05-17 ENCOUNTER — Encounter: Payer: Self-pay | Admitting: Cardiovascular Disease

## 2018-05-17 ENCOUNTER — Ambulatory Visit (INDEPENDENT_AMBULATORY_CARE_PROVIDER_SITE_OTHER): Payer: Medicare HMO | Admitting: Cardiovascular Disease

## 2018-05-17 VITALS — BP 138/78 | HR 94 | Ht 65.0 in | Wt 193.0 lb

## 2018-05-17 DIAGNOSIS — I10 Essential (primary) hypertension: Secondary | ICD-10-CM | POA: Diagnosis not present

## 2018-05-17 NOTE — Patient Instructions (Signed)

## 2018-05-24 ENCOUNTER — Ambulatory Visit (HOSPITAL_COMMUNITY)
Admission: RE | Admit: 2018-05-24 | Discharge: 2018-05-24 | Disposition: A | Discharge status: Home / Self Care | Payer: Medicare HMO | Source: Ambulatory Visit | Attending: Adult Health | Admitting: Adult Health

## 2018-05-24 DIAGNOSIS — Z1231 Encounter for screening mammogram for malignant neoplasm of breast: Secondary | ICD-10-CM | POA: Diagnosis not present

## 2018-05-31 DIAGNOSIS — E118 Type 2 diabetes mellitus with unspecified complications: Secondary | ICD-10-CM | POA: Diagnosis not present

## 2018-05-31 DIAGNOSIS — Z1389 Encounter for screening for other disorder: Secondary | ICD-10-CM | POA: Diagnosis not present

## 2018-05-31 DIAGNOSIS — Z23 Encounter for immunization: Secondary | ICD-10-CM | POA: Diagnosis not present

## 2018-05-31 DIAGNOSIS — R0981 Nasal congestion: Secondary | ICD-10-CM | POA: Diagnosis not present

## 2018-05-31 DIAGNOSIS — Z6833 Body mass index (BMI) 33.0-33.9, adult: Secondary | ICD-10-CM | POA: Diagnosis not present

## 2018-05-31 DIAGNOSIS — J0100 Acute maxillary sinusitis, unspecified: Secondary | ICD-10-CM | POA: Diagnosis not present

## 2018-05-31 DIAGNOSIS — R05 Cough: Secondary | ICD-10-CM | POA: Diagnosis not present

## 2018-07-09 DIAGNOSIS — J0100 Acute maxillary sinusitis, unspecified: Secondary | ICD-10-CM | POA: Diagnosis not present

## 2018-07-09 DIAGNOSIS — E6609 Other obesity due to excess calories: Secondary | ICD-10-CM | POA: Diagnosis not present

## 2018-07-09 DIAGNOSIS — J302 Other seasonal allergic rhinitis: Secondary | ICD-10-CM | POA: Diagnosis not present

## 2018-07-09 DIAGNOSIS — Z6832 Body mass index (BMI) 32.0-32.9, adult: Secondary | ICD-10-CM | POA: Diagnosis not present

## 2018-07-19 DIAGNOSIS — Z1389 Encounter for screening for other disorder: Secondary | ICD-10-CM | POA: Diagnosis not present

## 2018-07-19 DIAGNOSIS — R5383 Other fatigue: Secondary | ICD-10-CM | POA: Diagnosis not present

## 2018-07-19 DIAGNOSIS — I2510 Atherosclerotic heart disease of native coronary artery without angina pectoris: Secondary | ICD-10-CM | POA: Diagnosis not present

## 2018-07-19 DIAGNOSIS — E7849 Other hyperlipidemia: Secondary | ICD-10-CM | POA: Diagnosis not present

## 2018-07-19 DIAGNOSIS — E1165 Type 2 diabetes mellitus with hyperglycemia: Secondary | ICD-10-CM | POA: Diagnosis not present

## 2018-07-19 DIAGNOSIS — Z6832 Body mass index (BMI) 32.0-32.9, adult: Secondary | ICD-10-CM | POA: Diagnosis not present

## 2018-07-19 DIAGNOSIS — I10 Essential (primary) hypertension: Secondary | ICD-10-CM | POA: Diagnosis not present

## 2018-07-19 DIAGNOSIS — E6609 Other obesity due to excess calories: Secondary | ICD-10-CM | POA: Diagnosis not present

## 2018-07-19 DIAGNOSIS — Z0001 Encounter for general adult medical examination with abnormal findings: Secondary | ICD-10-CM | POA: Diagnosis not present

## 2018-08-30 DIAGNOSIS — I10 Essential (primary) hypertension: Secondary | ICD-10-CM | POA: Diagnosis not present

## 2018-08-30 DIAGNOSIS — E1165 Type 2 diabetes mellitus with hyperglycemia: Secondary | ICD-10-CM | POA: Diagnosis not present

## 2018-08-30 DIAGNOSIS — E785 Hyperlipidemia, unspecified: Secondary | ICD-10-CM | POA: Diagnosis not present

## 2018-08-30 DIAGNOSIS — Z6831 Body mass index (BMI) 31.0-31.9, adult: Secondary | ICD-10-CM | POA: Diagnosis not present

## 2018-08-30 DIAGNOSIS — E6609 Other obesity due to excess calories: Secondary | ICD-10-CM | POA: Diagnosis not present

## 2018-08-30 DIAGNOSIS — M2040 Other hammer toe(s) (acquired), unspecified foot: Secondary | ICD-10-CM | POA: Diagnosis not present

## 2018-08-30 DIAGNOSIS — I2510 Atherosclerotic heart disease of native coronary artery without angina pectoris: Secondary | ICD-10-CM | POA: Diagnosis not present

## 2018-08-31 DIAGNOSIS — Z6831 Body mass index (BMI) 31.0-31.9, adult: Secondary | ICD-10-CM | POA: Diagnosis not present

## 2018-08-31 DIAGNOSIS — I2510 Atherosclerotic heart disease of native coronary artery without angina pectoris: Secondary | ICD-10-CM | POA: Diagnosis not present

## 2018-08-31 DIAGNOSIS — I10 Essential (primary) hypertension: Secondary | ICD-10-CM | POA: Diagnosis not present

## 2018-08-31 DIAGNOSIS — E6609 Other obesity due to excess calories: Secondary | ICD-10-CM | POA: Diagnosis not present

## 2018-08-31 DIAGNOSIS — E1165 Type 2 diabetes mellitus with hyperglycemia: Secondary | ICD-10-CM | POA: Diagnosis not present

## 2018-09-01 DIAGNOSIS — H26493 Other secondary cataract, bilateral: Secondary | ICD-10-CM | POA: Diagnosis not present

## 2018-09-01 DIAGNOSIS — Z961 Presence of intraocular lens: Secondary | ICD-10-CM | POA: Diagnosis not present

## 2018-09-01 DIAGNOSIS — H52223 Regular astigmatism, bilateral: Secondary | ICD-10-CM | POA: Diagnosis not present

## 2018-09-01 DIAGNOSIS — H5213 Myopia, bilateral: Secondary | ICD-10-CM | POA: Diagnosis not present

## 2018-09-08 DIAGNOSIS — J029 Acute pharyngitis, unspecified: Secondary | ICD-10-CM | POA: Diagnosis not present

## 2018-09-08 DIAGNOSIS — Z6832 Body mass index (BMI) 32.0-32.9, adult: Secondary | ICD-10-CM | POA: Diagnosis not present

## 2018-09-08 DIAGNOSIS — J0100 Acute maxillary sinusitis, unspecified: Secondary | ICD-10-CM | POA: Diagnosis not present

## 2018-09-08 DIAGNOSIS — R05 Cough: Secondary | ICD-10-CM | POA: Diagnosis not present

## 2018-11-22 DIAGNOSIS — L821 Other seborrheic keratosis: Secondary | ICD-10-CM | POA: Diagnosis not present

## 2018-11-22 DIAGNOSIS — L308 Other specified dermatitis: Secondary | ICD-10-CM | POA: Diagnosis not present

## 2018-11-24 DIAGNOSIS — E785 Hyperlipidemia, unspecified: Secondary | ICD-10-CM | POA: Diagnosis not present

## 2018-11-24 DIAGNOSIS — I2510 Atherosclerotic heart disease of native coronary artery without angina pectoris: Secondary | ICD-10-CM | POA: Diagnosis not present

## 2018-11-24 DIAGNOSIS — E118 Type 2 diabetes mellitus with unspecified complications: Secondary | ICD-10-CM | POA: Diagnosis not present

## 2018-11-24 DIAGNOSIS — E6609 Other obesity due to excess calories: Secondary | ICD-10-CM | POA: Diagnosis not present

## 2018-11-24 DIAGNOSIS — I10 Essential (primary) hypertension: Secondary | ICD-10-CM | POA: Diagnosis not present

## 2018-11-24 DIAGNOSIS — Z6831 Body mass index (BMI) 31.0-31.9, adult: Secondary | ICD-10-CM | POA: Diagnosis not present

## 2019-03-02 DIAGNOSIS — E6609 Other obesity due to excess calories: Secondary | ICD-10-CM | POA: Diagnosis not present

## 2019-03-02 DIAGNOSIS — Z6831 Body mass index (BMI) 31.0-31.9, adult: Secondary | ICD-10-CM | POA: Diagnosis not present

## 2019-03-02 DIAGNOSIS — I10 Essential (primary) hypertension: Secondary | ICD-10-CM | POA: Diagnosis not present

## 2019-03-02 DIAGNOSIS — Z23 Encounter for immunization: Secondary | ICD-10-CM | POA: Diagnosis not present

## 2019-03-02 DIAGNOSIS — E7849 Other hyperlipidemia: Secondary | ICD-10-CM | POA: Diagnosis not present

## 2019-03-02 DIAGNOSIS — E119 Type 2 diabetes mellitus without complications: Secondary | ICD-10-CM | POA: Diagnosis not present

## 2019-03-16 DIAGNOSIS — H43813 Vitreous degeneration, bilateral: Secondary | ICD-10-CM | POA: Diagnosis not present

## 2019-03-16 DIAGNOSIS — H43393 Other vitreous opacities, bilateral: Secondary | ICD-10-CM | POA: Diagnosis not present

## 2019-03-16 DIAGNOSIS — E119 Type 2 diabetes mellitus without complications: Secondary | ICD-10-CM | POA: Diagnosis not present

## 2019-03-16 DIAGNOSIS — H5213 Myopia, bilateral: Secondary | ICD-10-CM | POA: Diagnosis not present

## 2019-03-16 DIAGNOSIS — Z961 Presence of intraocular lens: Secondary | ICD-10-CM | POA: Diagnosis not present

## 2019-05-12 DIAGNOSIS — J069 Acute upper respiratory infection, unspecified: Secondary | ICD-10-CM | POA: Diagnosis not present

## 2019-05-12 DIAGNOSIS — Z6831 Body mass index (BMI) 31.0-31.9, adult: Secondary | ICD-10-CM | POA: Diagnosis not present

## 2019-05-12 DIAGNOSIS — E6609 Other obesity due to excess calories: Secondary | ICD-10-CM | POA: Diagnosis not present

## 2019-05-16 ENCOUNTER — Other Ambulatory Visit: Payer: Self-pay

## 2019-05-16 ENCOUNTER — Other Ambulatory Visit (HOSPITAL_COMMUNITY): Payer: Self-pay | Admitting: Internal Medicine

## 2019-05-16 ENCOUNTER — Ambulatory Visit (HOSPITAL_COMMUNITY)
Admission: RE | Admit: 2019-05-16 | Discharge: 2019-05-16 | Disposition: A | Discharge status: Home / Self Care | Payer: Medicare HMO | Source: Ambulatory Visit | Attending: Internal Medicine | Admitting: Internal Medicine

## 2019-05-16 DIAGNOSIS — R059 Cough, unspecified: Secondary | ICD-10-CM

## 2019-05-16 DIAGNOSIS — R05 Cough: Secondary | ICD-10-CM | POA: Diagnosis not present

## 2019-05-16 DIAGNOSIS — C50911 Malignant neoplasm of unspecified site of right female breast: Secondary | ICD-10-CM | POA: Diagnosis not present

## 2019-05-16 DIAGNOSIS — Z6829 Body mass index (BMI) 29.0-29.9, adult: Secondary | ICD-10-CM | POA: Diagnosis not present

## 2019-05-16 DIAGNOSIS — J189 Pneumonia, unspecified organism: Secondary | ICD-10-CM | POA: Diagnosis not present

## 2019-05-16 DIAGNOSIS — R079 Chest pain, unspecified: Secondary | ICD-10-CM | POA: Diagnosis not present

## 2019-05-16 DIAGNOSIS — E663 Overweight: Secondary | ICD-10-CM | POA: Diagnosis not present

## 2019-05-16 DIAGNOSIS — I10 Essential (primary) hypertension: Secondary | ICD-10-CM | POA: Diagnosis not present

## 2019-05-16 DIAGNOSIS — R5383 Other fatigue: Secondary | ICD-10-CM | POA: Diagnosis not present

## 2019-05-16 DIAGNOSIS — E119 Type 2 diabetes mellitus without complications: Secondary | ICD-10-CM | POA: Diagnosis not present

## 2019-05-22 IMAGING — DX DG CHEST 1V
1 series · 1 of 1 positions shown · non-contrast
Comparison: 07/03/2011

CLINICAL DATA: Headache history of breast cancer

EXAM:
CHEST 1 VIEW

[chest ap]
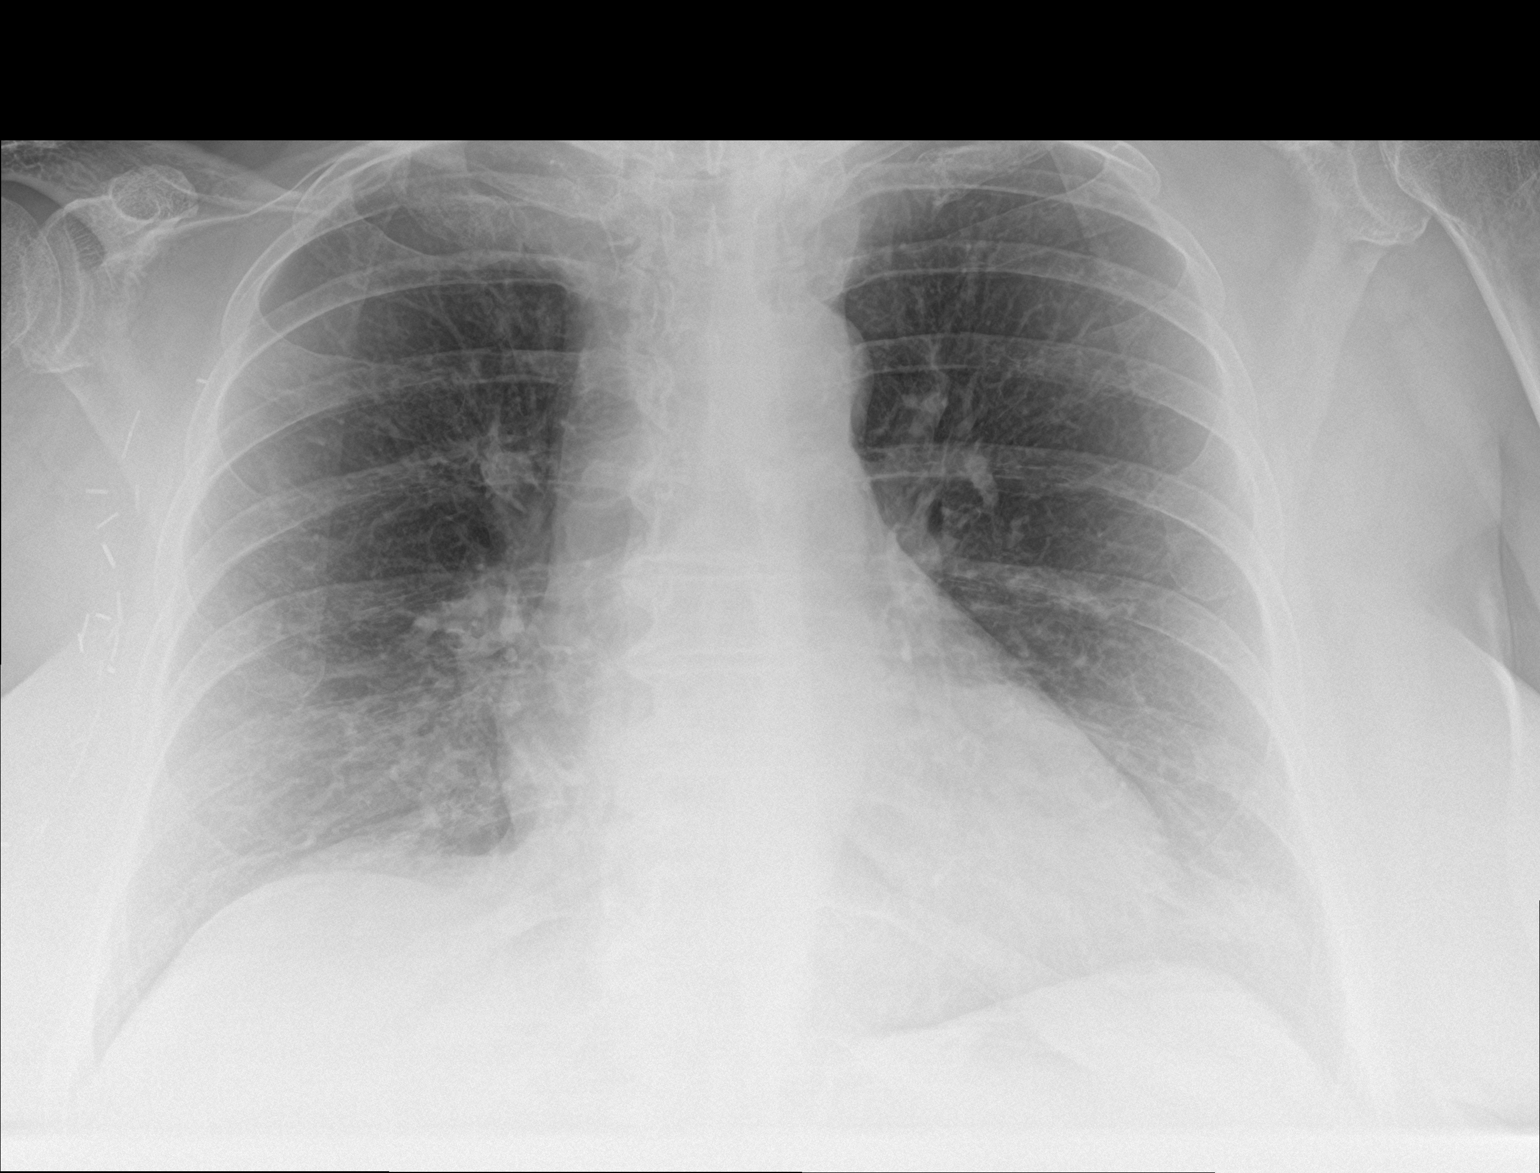

[1 of 1 positions shown; findings below may reference images not displayed]

FINDINGS: Postsurgical changes in the right axilla. No acute consolidation or
effusion. Borderline heart size. No pneumothorax. Mild bronchitic
changes at the bases.
IMPRESSION: No active disease.  Mild bronchitic changes at the bases.

## 2019-05-22 IMAGING — CT CT HEAD W/O CM
3 series · 16 of 47 positions shown, 19 images · non-contrast
Comparison: None.

CLINICAL DATA: Severe migraines with brief episode of altered
mental status.

EXAM:
CT HEAD WITHOUT CONTRAST
TECHNIQUE: Contiguous axial images were obtained from the base of the skull
through the vertex without intravenous contrast.

[Series 2: head wo · axial · 0.43mm/px · z∈[-18,+117]mm · 10 of 33 slices shown, 13 images]
[im 3/33  brain]
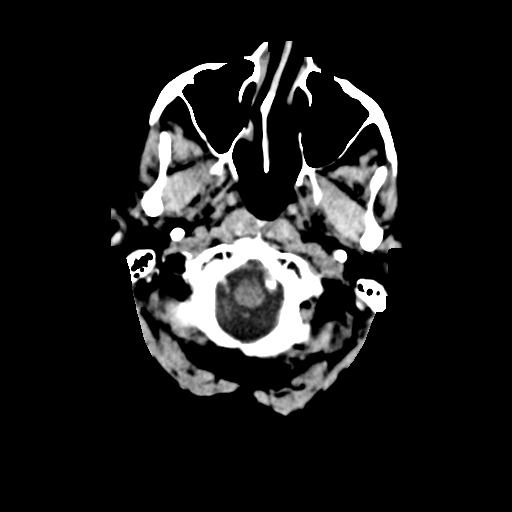
[im 3/33  bone]
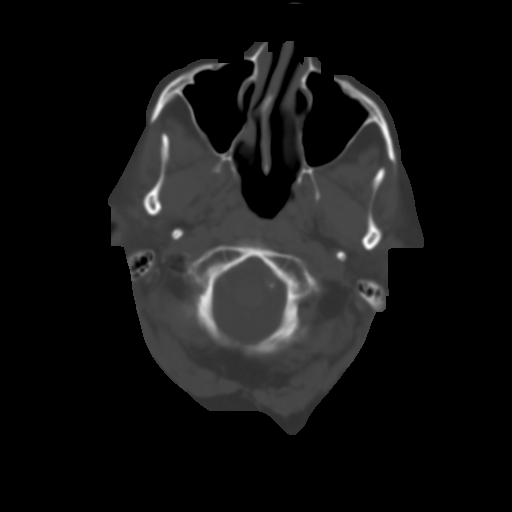
[im 6/33  brain]
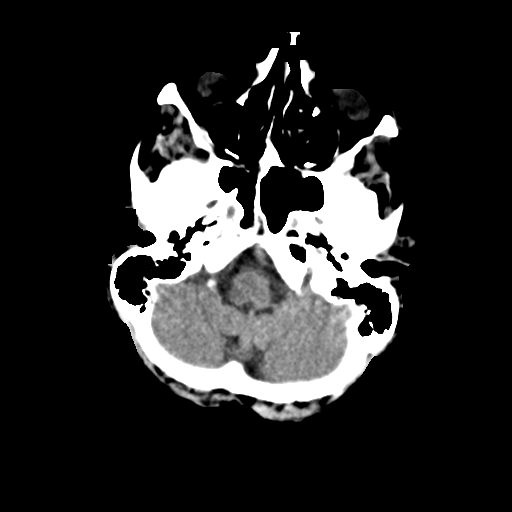
[im 9/33  brain]
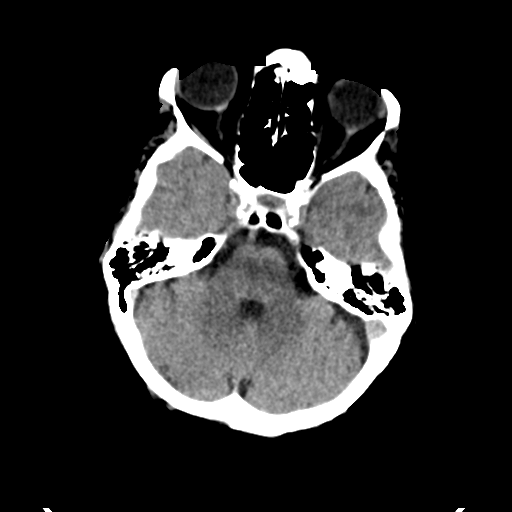
[im 12/33  brain]
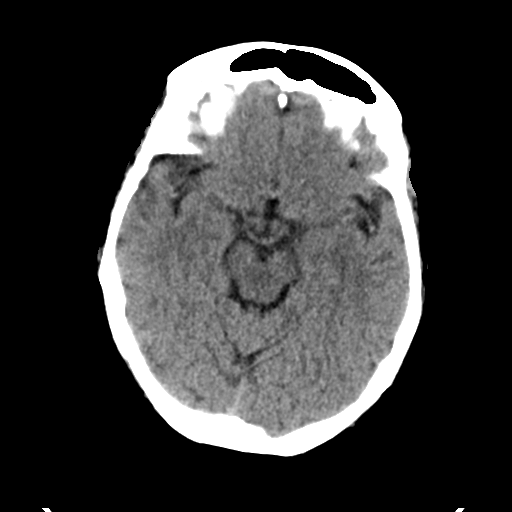
[im 15/33  brain]
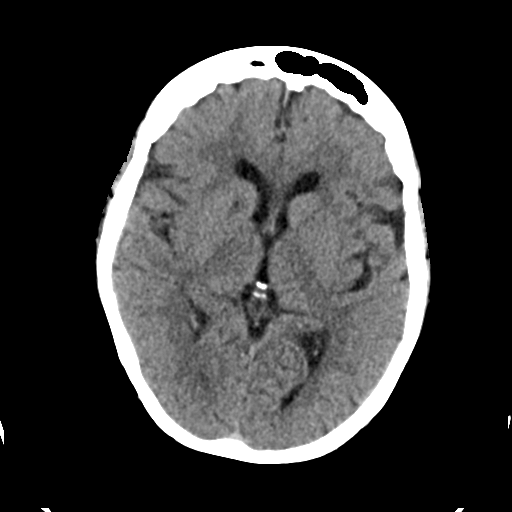
[im 15/33  bone]
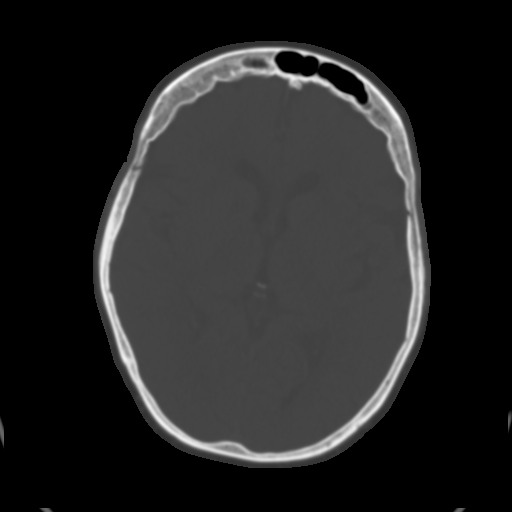
[im 18/33  brain]
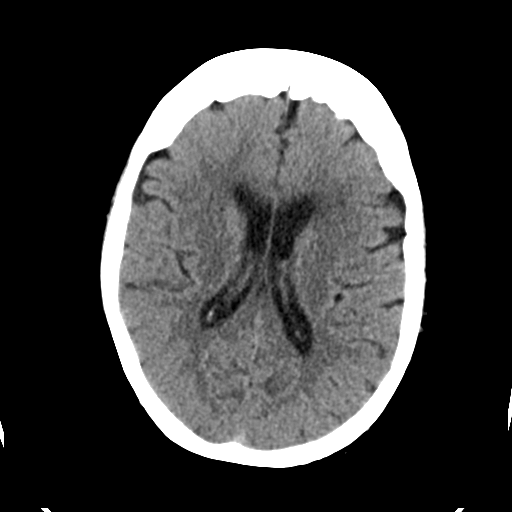
[im 21/33  brain]
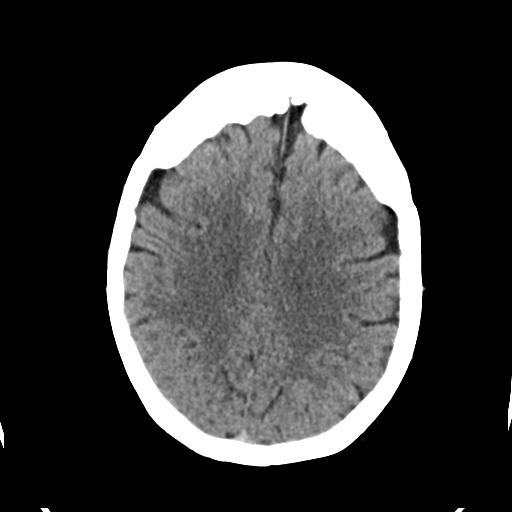
[im 25/33  brain]
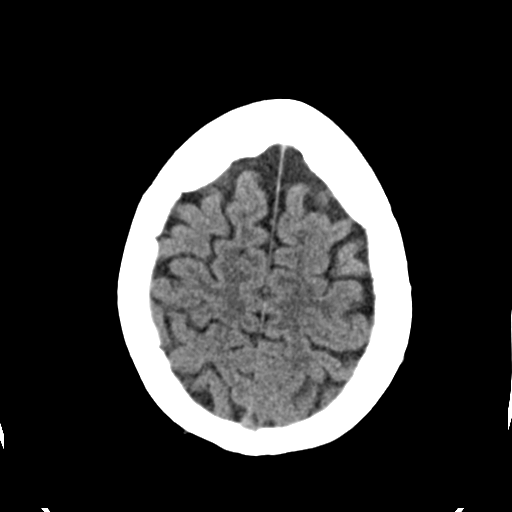
[im 27/33  brain]
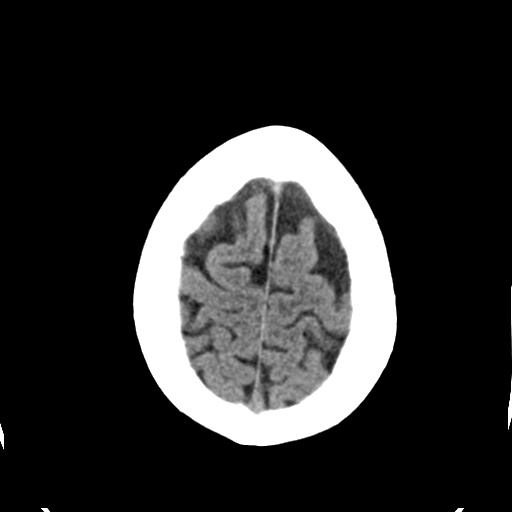
[im 27/33  bone]
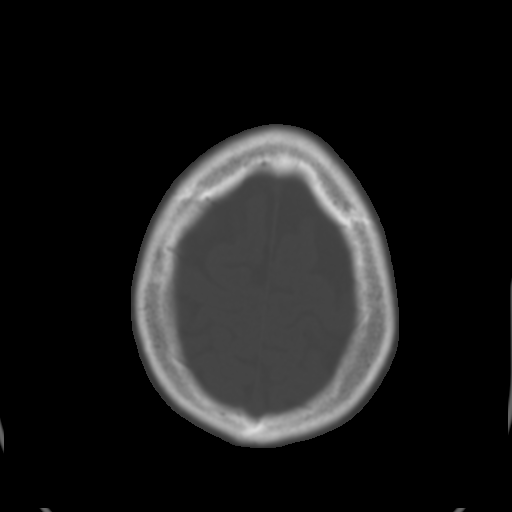
[im 30/33  brain]
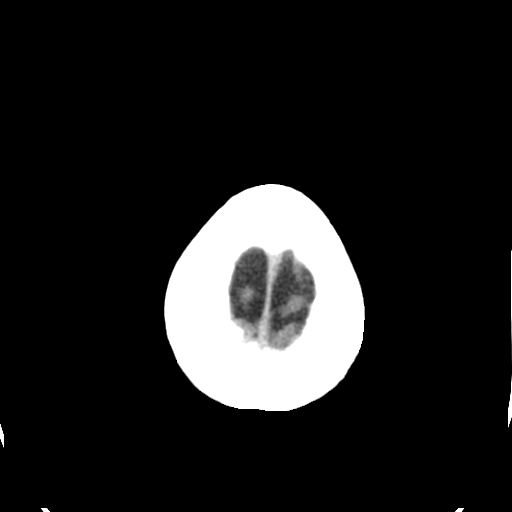

[Series 4: coronal soft tissue · coronal · 0.33mm/px · 3 of 70 slices shown]
[im 24/70  brain]
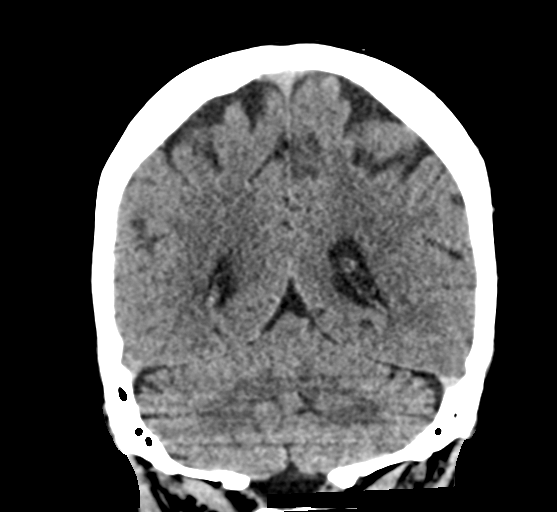
[im 31/70  brain]
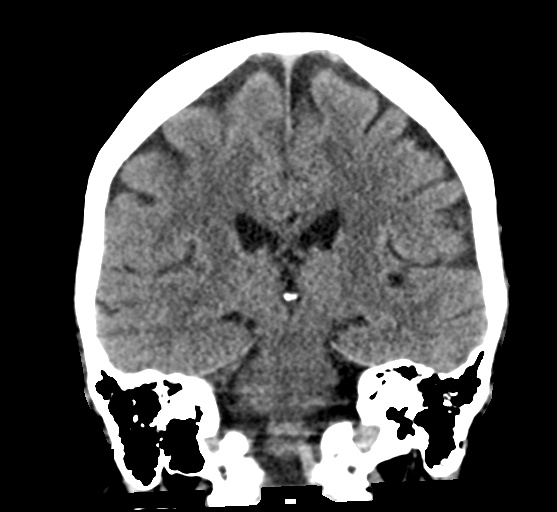
[im 39/70  brain]
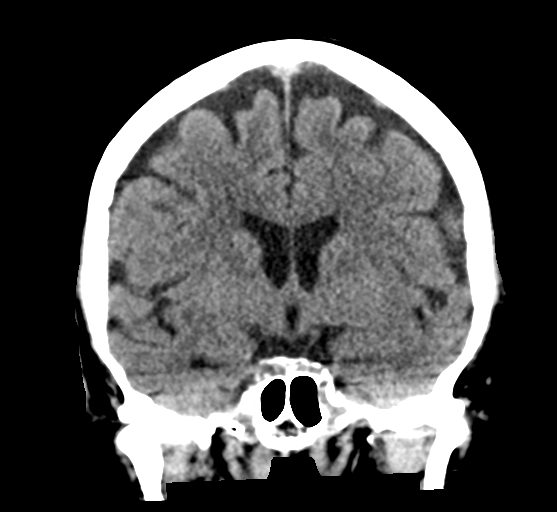

[Series 5: sagittal soft tissue · sagittal · 0.37mm/px · 3 of 61 slices shown]
[im 21/61  brain]
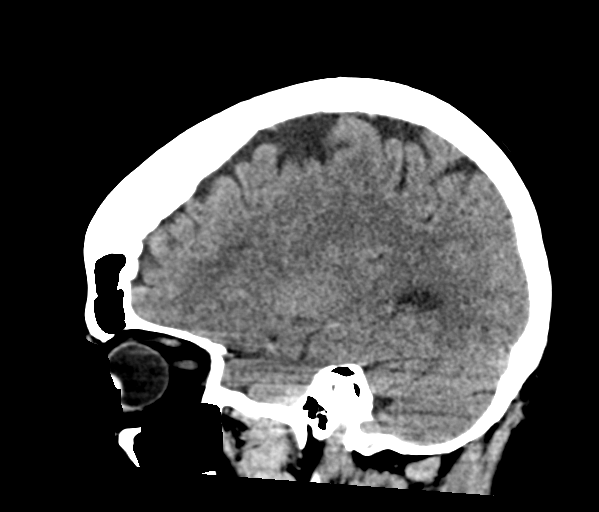
[im 31/61  brain]
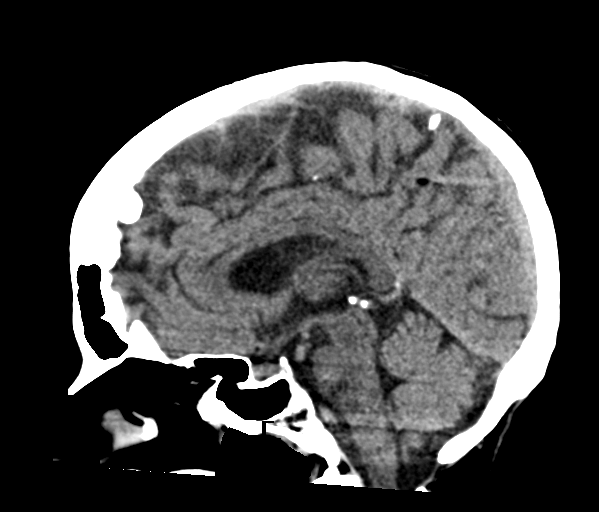
[im 41/61  brain]
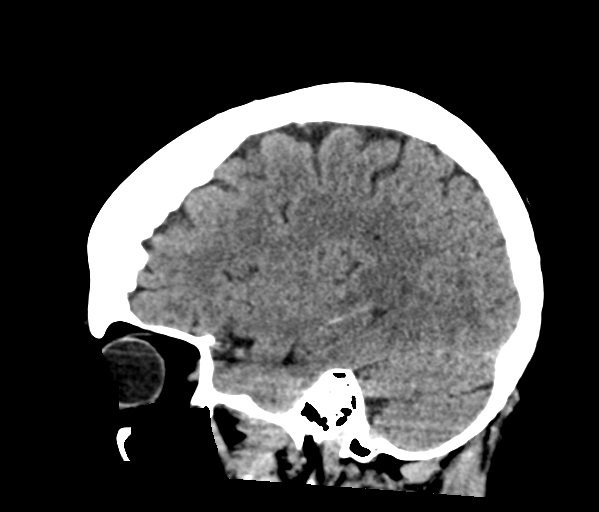

[16 of 47 positions shown; findings below may reference images not displayed]

FINDINGS: Brain: No acute intracranial hemorrhage, midline shift or edema. No
large vascular territory infarct. Chronic appearing small vessel
ischemic disease with right-sided basal ganglial lacunar infarcts.
No hydrocephalus. Midline fourth ventricle and basal cisterns
without effacement. No intra-axial mass or extra-axial fluid
collections involutional. Mild age related involutional changes of
the brain.

Vascular: No hyperdense vessels.

Skull: No skull fracture or suspicious osseous lesions.

Sinuses/Orbits: Polypoid mucosal thickening in the left maxillary
sinus, suspicious for mucous retention cyst. Intact orbits and
globes. Right lens replacement.

Other: None
IMPRESSION: Chronic microvascular ischemic disease of periventricular white
matter. No acute intracranial abnormality.

Polypoid mucosal thickening in the left maxillary sinus compatible
with partial volume averaging of a mucous retention cyst.

## 2019-05-26 DIAGNOSIS — E663 Overweight: Secondary | ICD-10-CM | POA: Diagnosis not present

## 2019-05-26 DIAGNOSIS — Z20828 Contact with and (suspected) exposure to other viral communicable diseases: Secondary | ICD-10-CM | POA: Diagnosis not present

## 2019-05-26 DIAGNOSIS — Z6829 Body mass index (BMI) 29.0-29.9, adult: Secondary | ICD-10-CM | POA: Diagnosis not present

## 2019-05-26 DIAGNOSIS — U071 COVID-19: Secondary | ICD-10-CM | POA: Diagnosis not present

## 2019-05-27 ENCOUNTER — Other Ambulatory Visit (HOSPITAL_COMMUNITY): Payer: Self-pay | Admitting: Family Medicine

## 2019-05-27 DIAGNOSIS — Z1231 Encounter for screening mammogram for malignant neoplasm of breast: Secondary | ICD-10-CM

## 2019-06-02 ENCOUNTER — Ambulatory Visit (HOSPITAL_COMMUNITY)
Admission: RE | Admit: 2019-06-02 | Discharge: 2019-06-02 | Disposition: A | Discharge status: Home / Self Care | Payer: Medicare HMO | Source: Ambulatory Visit | Attending: Family Medicine | Admitting: Family Medicine

## 2019-06-02 ENCOUNTER — Other Ambulatory Visit: Payer: Self-pay

## 2019-06-02 DIAGNOSIS — Z1231 Encounter for screening mammogram for malignant neoplasm of breast: Secondary | ICD-10-CM | POA: Insufficient documentation

## 2019-06-03 ENCOUNTER — Other Ambulatory Visit (HOSPITAL_COMMUNITY): Payer: Self-pay | Admitting: Family Medicine

## 2019-06-03 DIAGNOSIS — N644 Mastodynia: Secondary | ICD-10-CM

## 2019-06-08 ENCOUNTER — Other Ambulatory Visit (HOSPITAL_COMMUNITY): Payer: Self-pay | Admitting: Family Medicine

## 2019-06-08 DIAGNOSIS — N644 Mastodynia: Secondary | ICD-10-CM

## 2019-06-08 NOTE — Progress Notes (Signed)
Cardiology Office Note   Date:  06/14/2019   ID:  Rayn, [place] [birth year], MRN [id number]  PCP:  Sharilyn Sites, MD  Cardiologist:   Jenkins Rouge, MD   No chief complaint on file.     History of Present Illness: Erika Ramos is a 73 y.o. female who presents for f/u of HTN , DM and CAD Previously seen by Dr Verl Blalock In 2011 and more recently by Dr Aundra Dubin in 2014  Had PCI of RCA with stent  back in 2001  Echo 2011 with normal EF 60-65% Cardiolie June 2011 with small apical defect no ischemia EF 69%  In interim she was Rx for breast cancer with lumpectomy chemo and radiation.   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 She seems to be well compensated. Has not been able to go to [employer] Since COVID   Past Medical History:  Diagnosis Date  . Breast cancer ([place])   . Breast cancer, stage 2, right ([place]) 09/06/2009   Qualifier: Diagnosis of  By: Orville Govern CMA, Carol    . Coronary artery disease   . Diabetes mellitus without complication ([place])   . Fatty liver disease, nonalcoholic   . GERD (gastroesophageal reflux disease)   . Hypertension   . PONV (postoperative nausea and vomiting)    Nausea/ Vomitting- last time 2010  . Stented coronary artery     Past Surgical History:  Procedure Laterality Date  . ABDOMINAL HYSTERECTOMY    . BREAST SURGERY  March 2010  . cataract  Right 09/2012  . CATARACT EXTRACTION  01/01/2018   left eye  . FRACTURE SURGERY     wrist  right  . PARS PLANA VITRECTOMY  07/03/2011   Procedure: PARS PLANA VITRECTOMY WITH 25 GAUGE;  Surgeon: Hayden Pedro, MD;  Location: [facility];   Service: Ophthalmology;  Laterality: Right;  MEMBRANE PEEL, HEAD SCOPE LASER, GAS  . RETINAL DETACHMENT SURGERY    . RETINAL DETACHMENT SURGERY  07/03/11   right  . TONSILLECTOMY       Current Outpatient Medications  Medication Sig Dispense Refill  . aspirin EC 81 MG tablet Take 81 mg by mouth daily.     . Calcium-Vitamin D-Vitamin K [id number] MG-UNT-MCG CHEW Chew 1 each by mouth daily.     Marland Kitchen JANUMET 50-1000 MG tablet Take 1 tablet by mouth 2 (two) times daily with a meal.     . Multiple Vitamins-Minerals (CENTRUM SILVER ULTRA WOMENS PO) Take 1 tablet by mouth daily.     Marland Kitchen omeprazole (PRILOSEC) 20 MG capsule Take 20 mg by mouth 2 (two) times a week.     . ramipril (ALTACE) 10 MG capsule Take 10 mg by mouth daily.      . simvastatin (ZOCOR) 40 MG tablet Take 40 mg by mouth daily.     No current facility-administered medications for this visit.    Allergies:   Clopidogrel bisulfate and Oxycontin [oxycodone hcl]    Social History:  The patient  reports that she has never smoked. She has never used smokeless tobacco.  She reports that she does not drink alcohol or use drugs.   Family History:  The patient's family history includes Cancer in her brother; Diabetes in her brother, sister, and sister; Heart disease in her brother and mother; Ovarian cancer in her mother; Stroke in her father.    ROS:  Please see the history of present illness.   Otherwise, review of systems are positive for none.   All other systems are reviewed and negative.    PHYSICAL EXAM: VS:  Ht 5\' 5"  (1.651 m)   BMI 32.12 kg/m  , BMI Body mass index is 32.12 kg/m. Affect appropriate Healthy:  appears stated age 73: normal Neck supple with no adenopathy JVP normal no bruits no thyromegaly Lungs clear with no wheezing and good diaphragmatic motion Heart:  S1/S2 no murmur, no rub, gallop or click PMI normal Abdomen: benighn, BS positve, no tenderness, no AAA no bruit.  No HSM or HJR Distal pulses intact  with no bruits No edema Neuro non-focal Skin warm and dry No muscular weakness     EKG:  2014 SR rate 83  LAFB otherwise normal  04/03/16 SR old IMI no acute changes 03/30/17 SR rate 90 poor R wave progression   Recent Labs: No results found for requested labs within last 8760 hours.    Lipid Panel No results found for: CHOL, TRIG, HDL, CHOLHDL, VLDL, LDLCALC, LDLDIRECT    Wt Readings from Last 3 Encounters:  05/17/18 193 lb (87.5 kg)  07/17/17 192 lb (87.1 kg)  07/07/17 196 lb 4.8 oz (89 kg)      Other studies Reviewed: Additional studies/ records that were reviewed today include: Notes / Cath Dr Fletcher Anon and [facility] Situ Notes primary Dr Leveda Anna .    ASSESSMENT AND PLAN:  1.  CAD  Distant stent RCA 2001 non ischemic myovue 03/2016 continue medical RX  2. Chol: on statin labs with primary  3. HTN: Well controlled.  Continue current medications and low sodium Dash type diet.   4. DM: Discussed low carb diet and portion control f/u primary target A1c 6.0 5. Obesity  Discussed diet and exercise    Current medicines are reviewed at length with the patient today.  The patient does not have concerns regarding medicines.  The following changes have been made:  no change  Labs/ tests ordered today include: None    No orders of the defined types were placed in this encounter.    Disposition:   FU with me in a year    Signed, Jenkins Rouge, MD  06/14/2019 11:06 AM    [facility] [place], [place], [place]  [address] Phone: [phone]; Fax: [phone]

## 2019-06-14 ENCOUNTER — Ambulatory Visit (HOSPITAL_COMMUNITY)
Admission: RE | Admit: 2019-06-14 | Discharge: 2019-06-14 | Disposition: A | Discharge status: Home / Self Care | Payer: Medicare HMO | Source: Ambulatory Visit | Attending: Family Medicine | Admitting: Family Medicine

## 2019-06-14 ENCOUNTER — Other Ambulatory Visit: Payer: Self-pay

## 2019-06-14 ENCOUNTER — Ambulatory Visit (INDEPENDENT_AMBULATORY_CARE_PROVIDER_SITE_OTHER): Payer: Medicare HMO | Admitting: Cardiovascular Disease

## 2019-06-14 ENCOUNTER — Ambulatory Visit (HOSPITAL_COMMUNITY): Admission: RE | Admit: 2019-06-14 | Payer: Medicare HMO | Source: Ambulatory Visit

## 2019-06-14 ENCOUNTER — Encounter: Payer: Self-pay | Admitting: Cardiovascular Disease

## 2019-06-14 VITALS — BP 154/89 | HR 90 | Temp 97.3°F | Ht 65.0 in | Wt 177.0 lb

## 2019-06-14 DIAGNOSIS — N644 Mastodynia: Secondary | ICD-10-CM | POA: Insufficient documentation

## 2019-06-14 DIAGNOSIS — I10 Essential (primary) hypertension: Secondary | ICD-10-CM

## 2019-06-14 DIAGNOSIS — I2510 Atherosclerotic heart disease of native coronary artery without angina pectoris: Secondary | ICD-10-CM | POA: Diagnosis not present

## 2019-06-14 DIAGNOSIS — R928 Other abnormal and inconclusive findings on diagnostic imaging of breast: Secondary | ICD-10-CM | POA: Diagnosis not present

## 2019-06-14 NOTE — Patient Instructions (Signed)

## 2019-06-28 ENCOUNTER — Encounter (HOSPITAL_COMMUNITY): Payer: Medicare HMO

## 2019-06-28 ENCOUNTER — Other Ambulatory Visit (HOSPITAL_COMMUNITY): Payer: Medicare HMO

## 2019-07-29 DIAGNOSIS — Z1389 Encounter for screening for other disorder: Secondary | ICD-10-CM | POA: Diagnosis not present

## 2019-07-29 DIAGNOSIS — E663 Overweight: Secondary | ICD-10-CM | POA: Diagnosis not present

## 2019-07-29 DIAGNOSIS — E559 Vitamin D deficiency, unspecified: Secondary | ICD-10-CM | POA: Diagnosis not present

## 2019-07-29 DIAGNOSIS — Z0001 Encounter for general adult medical examination with abnormal findings: Secondary | ICD-10-CM | POA: Diagnosis not present

## 2019-07-29 DIAGNOSIS — I10 Essential (primary) hypertension: Secondary | ICD-10-CM | POA: Diagnosis not present

## 2019-07-29 DIAGNOSIS — E118 Type 2 diabetes mellitus with unspecified complications: Secondary | ICD-10-CM | POA: Diagnosis not present

## 2019-07-29 DIAGNOSIS — Z6829 Body mass index (BMI) 29.0-29.9, adult: Secondary | ICD-10-CM | POA: Diagnosis not present

## 2019-07-29 DIAGNOSIS — E785 Hyperlipidemia, unspecified: Secondary | ICD-10-CM | POA: Diagnosis not present

## 2019-07-29 DIAGNOSIS — I2510 Atherosclerotic heart disease of native coronary artery without angina pectoris: Secondary | ICD-10-CM | POA: Diagnosis not present

## 2019-07-29 DIAGNOSIS — C50919 Malignant neoplasm of unspecified site of unspecified female breast: Secondary | ICD-10-CM | POA: Diagnosis not present

## 2019-07-31 ENCOUNTER — Ambulatory Visit: Payer: Medicare HMO | Attending: Internal Medicine

## 2019-07-31 DIAGNOSIS — Z23 Encounter for immunization: Secondary | ICD-10-CM | POA: Insufficient documentation

## 2019-07-31 NOTE — Progress Notes (Signed)
   Covid-19 Vaccination Clinic  Name:  Erika Ramos    MRN: [id number] DOB: May 24, 1946  07/31/2019  Erika Ramos was observed post Covid-19 immunization for 30 minutes based on pre-vaccination screening without incidence. She was provided with Vaccine Information Sheet and instruction to access the V-Safe system.   Erika Ramos was instructed to call 911 with any severe reactions post vaccine: Marland Kitchen Difficulty breathing  . Swelling of your face and throat  . A fast heartbeat  . A bad rash all over your body  . Dizziness and weakness    Immunizations Administered    Name Date Dose VIS Date Route   Pfizer COVID-19 Vaccine 07/31/2019 10:35 AM 0.3 mL 06/03/2019 Intramuscular   Manufacturer: [place]   Lot: [id number]   [place]: [id number]

## 2019-08-24 ENCOUNTER — Ambulatory Visit: Payer: Medicare HMO | Attending: Internal Medicine

## 2019-08-24 DIAGNOSIS — Z23 Encounter for immunization: Secondary | ICD-10-CM | POA: Insufficient documentation

## 2019-08-24 NOTE — Progress Notes (Signed)
   Covid-19 Vaccination Clinic  Name:  CARTIER MENZA    MRN: [id number] DOB: [birth year]  08/24/2019  Ms. Danby was observed post Covid-19 immunization for 15 minutes without incident. She was provided with Vaccine Information Sheet and instruction to access the V-Safe system.   Ms. Dose was instructed to call 911 with any severe reactions post vaccine: Marland Kitchen Difficulty breathing  . Swelling of face and throat  . A fast heartbeat  . A bad rash all over body  . Dizziness and weakness   Immunizations Administered    Name Date Dose VIS Date Route   Pfizer COVID-19 Vaccine 08/24/2019 11:23 AM 0.3 mL 06/03/2019 Intramuscular   Manufacturer: [place]   Lot: [id number]   [place]: [id number]

## 2019-08-29 DIAGNOSIS — E119 Type 2 diabetes mellitus without complications: Secondary | ICD-10-CM | POA: Diagnosis not present

## 2019-08-31 DIAGNOSIS — R32 Unspecified urinary incontinence: Secondary | ICD-10-CM | POA: Diagnosis not present

## 2019-08-31 DIAGNOSIS — E785 Hyperlipidemia, unspecified: Secondary | ICD-10-CM | POA: Diagnosis not present

## 2019-08-31 DIAGNOSIS — I10 Essential (primary) hypertension: Secondary | ICD-10-CM | POA: Diagnosis not present

## 2019-08-31 DIAGNOSIS — I2510 Atherosclerotic heart disease of native coronary artery without angina pectoris: Secondary | ICD-10-CM | POA: Diagnosis not present

## 2019-08-31 DIAGNOSIS — Z7982 Long term (current) use of aspirin: Secondary | ICD-10-CM | POA: Diagnosis not present

## 2019-08-31 DIAGNOSIS — K219 Gastro-esophageal reflux disease without esophagitis: Secondary | ICD-10-CM | POA: Diagnosis not present

## 2019-08-31 DIAGNOSIS — Z853 Personal history of malignant neoplasm of breast: Secondary | ICD-10-CM | POA: Diagnosis not present

## 2019-08-31 DIAGNOSIS — Z7984 Long term (current) use of oral hypoglycemic drugs: Secondary | ICD-10-CM | POA: Diagnosis not present

## 2019-08-31 DIAGNOSIS — E119 Type 2 diabetes mellitus without complications: Secondary | ICD-10-CM | POA: Diagnosis not present

## 2019-10-21 ENCOUNTER — Other Ambulatory Visit: Payer: Self-pay | Admitting: Family Medicine

## 2019-10-21 DIAGNOSIS — E049 Nontoxic goiter, unspecified: Secondary | ICD-10-CM

## 2019-10-21 DIAGNOSIS — E041 Nontoxic single thyroid nodule: Secondary | ICD-10-CM

## 2019-11-15 ENCOUNTER — Encounter: Payer: Self-pay | Admitting: Gastroenterology

## 2020-03-08 DIAGNOSIS — I10 Essential (primary) hypertension: Secondary | ICD-10-CM | POA: Diagnosis not present

## 2020-03-08 DIAGNOSIS — E119 Type 2 diabetes mellitus without complications: Secondary | ICD-10-CM | POA: Diagnosis not present

## 2020-03-08 DIAGNOSIS — E7849 Other hyperlipidemia: Secondary | ICD-10-CM | POA: Diagnosis not present

## 2020-03-08 DIAGNOSIS — Z6830 Body mass index (BMI) 30.0-30.9, adult: Secondary | ICD-10-CM | POA: Diagnosis not present

## 2020-03-12 DIAGNOSIS — I10 Essential (primary) hypertension: Secondary | ICD-10-CM | POA: Diagnosis not present

## 2020-03-12 DIAGNOSIS — Z6830 Body mass index (BMI) 30.0-30.9, adult: Secondary | ICD-10-CM | POA: Diagnosis not present

## 2020-03-12 DIAGNOSIS — E7849 Other hyperlipidemia: Secondary | ICD-10-CM | POA: Diagnosis not present

## 2020-03-12 DIAGNOSIS — E119 Type 2 diabetes mellitus without complications: Secondary | ICD-10-CM | POA: Diagnosis not present

## 2020-03-29 DIAGNOSIS — H43393 Other vitreous opacities, bilateral: Secondary | ICD-10-CM | POA: Diagnosis not present

## 2020-03-29 DIAGNOSIS — H59811 Chorioretinal scars after surgery for detachment, right eye: Secondary | ICD-10-CM | POA: Diagnosis not present

## 2020-03-29 DIAGNOSIS — H33301 Unspecified retinal break, right eye: Secondary | ICD-10-CM | POA: Diagnosis not present

## 2020-03-29 DIAGNOSIS — H33003 Unspecified retinal detachment with retinal break, bilateral: Secondary | ICD-10-CM | POA: Diagnosis not present

## 2020-03-29 DIAGNOSIS — I10 Essential (primary) hypertension: Secondary | ICD-10-CM | POA: Diagnosis not present

## 2020-03-29 DIAGNOSIS — H33303 Unspecified retinal break, bilateral: Secondary | ICD-10-CM | POA: Diagnosis not present

## 2020-03-29 DIAGNOSIS — H524 Presbyopia: Secondary | ICD-10-CM | POA: Diagnosis not present

## 2020-03-29 DIAGNOSIS — H52223 Regular astigmatism, bilateral: Secondary | ICD-10-CM | POA: Diagnosis not present

## 2020-03-29 DIAGNOSIS — H5213 Myopia, bilateral: Secondary | ICD-10-CM | POA: Diagnosis not present

## 2020-03-29 DIAGNOSIS — E119 Type 2 diabetes mellitus without complications: Secondary | ICD-10-CM | POA: Diagnosis not present

## 2020-03-29 DIAGNOSIS — H35033 Hypertensive retinopathy, bilateral: Secondary | ICD-10-CM | POA: Diagnosis not present

## 2020-05-25 DIAGNOSIS — R69 Illness, unspecified: Secondary | ICD-10-CM | POA: Diagnosis not present

## 2020-07-05 DIAGNOSIS — I2510 Atherosclerotic heart disease of native coronary artery without angina pectoris: Secondary | ICD-10-CM | POA: Diagnosis not present

## 2020-07-05 DIAGNOSIS — Z7722 Contact with and (suspected) exposure to environmental tobacco smoke (acute) (chronic): Secondary | ICD-10-CM | POA: Diagnosis not present

## 2020-07-05 DIAGNOSIS — R32 Unspecified urinary incontinence: Secondary | ICD-10-CM | POA: Diagnosis not present

## 2020-07-05 DIAGNOSIS — I10 Essential (primary) hypertension: Secondary | ICD-10-CM | POA: Diagnosis not present

## 2020-07-05 DIAGNOSIS — E669 Obesity, unspecified: Secondary | ICD-10-CM | POA: Diagnosis not present

## 2020-07-05 DIAGNOSIS — E119 Type 2 diabetes mellitus without complications: Secondary | ICD-10-CM | POA: Diagnosis not present

## 2020-07-05 DIAGNOSIS — K219 Gastro-esophageal reflux disease without esophagitis: Secondary | ICD-10-CM | POA: Diagnosis not present

## 2020-07-05 DIAGNOSIS — E785 Hyperlipidemia, unspecified: Secondary | ICD-10-CM | POA: Diagnosis not present

## 2020-07-05 DIAGNOSIS — Z6830 Body mass index (BMI) 30.0-30.9, adult: Secondary | ICD-10-CM | POA: Diagnosis not present

## 2020-07-05 DIAGNOSIS — R69 Illness, unspecified: Secondary | ICD-10-CM | POA: Diagnosis not present

## 2020-07-26 ENCOUNTER — Other Ambulatory Visit (HOSPITAL_COMMUNITY): Payer: Self-pay | Admitting: Family Medicine

## 2020-07-26 DIAGNOSIS — E2839 Other primary ovarian failure: Secondary | ICD-10-CM

## 2020-08-02 ENCOUNTER — Ambulatory Visit (HOSPITAL_COMMUNITY)
Admission: RE | Admit: 2020-08-02 | Discharge: 2020-08-02 | Disposition: A | Discharge status: Home / Self Care | Payer: Medicare HMO | Source: Ambulatory Visit | Attending: Family Medicine | Admitting: Family Medicine

## 2020-08-02 ENCOUNTER — Other Ambulatory Visit: Payer: Self-pay

## 2020-08-02 DIAGNOSIS — E2839 Other primary ovarian failure: Secondary | ICD-10-CM

## 2020-08-02 DIAGNOSIS — M8588 Other specified disorders of bone density and structure, other site: Secondary | ICD-10-CM | POA: Diagnosis not present

## 2020-08-10 NOTE — Progress Notes (Signed)
Cardiology Office Note   Date:  08/13/2020   ID:  Trenace, [place] 07-15-1945, MRN [id number]  PCP:  Sharilyn Sites, MD  Cardiologist:   Jenkins Rouge, MD   No chief complaint on file.     History of Present Illness: Erika Ramos is a 75 y.o. female who presents for f/u of HTN , DM and CAD Previously seen by Dr Verl Blalock In 2011 and  by Dr Aundra Dubin in 2014  Had PCI of RCA with stent  back in 2001  Echo 2011 with normal EF 60-65% Cardiolie June 2011 with small apical defect no ischemia EF 69%  In interim she was Rx for breast cancer with lumpectomy chemo and radiation.   Previous substitute teacher She has 4 grand children  and son/daughter within 6 miles   Diet still poor and DM not well controlled   Complained of chest pain 03/2016 Myovue reviewed  No ischemia breast attenuation EF 75%   Husband died of bladder cancer in 04-01-18  Had been married 82 years He refused Rx 2 years Ago and had an "ugly" death. She seems to be well compensated. Has not been able to go to [employer] Since COVID She is vaccinated   HR up a bit today She says labs in Apr 02, 2023 showed good Hct/Thyroid No fever no chest pain dyspnea Syncope or palpitations  Needs nitro    Past Medical History:  Diagnosis Date  . Breast cancer ([place])   . Breast cancer, stage 2, right ([place]) 09/06/2009   Qualifier: Diagnosis of  By: Orville Govern CMA, Carol    . Coronary artery disease   . Diabetes mellitus without complication ([place])   . Fatty liver disease, nonalcoholic   . GERD (gastroesophageal reflux disease)   . Hypertension   . PONV (postoperative nausea and vomiting)    Nausea/ Vomitting- last time 2010  . Stented coronary artery     Past Surgical History:  Procedure Laterality Date  . ABDOMINAL HYSTERECTOMY    . BREAST SURGERY  March 2010  . cataract  Right 09/2012  . CATARACT EXTRACTION  01/01/2018   left eye  . FRACTURE SURGERY     wrist  right  . PARS PLANA VITRECTOMY  07/03/2011    Procedure: PARS PLANA VITRECTOMY WITH 25 GAUGE;  Surgeon: Hayden Pedro, MD;  Location: [facility];  Service: Ophthalmology;  Laterality: Right;  MEMBRANE PEEL, HEAD SCOPE LASER, GAS  . RETINAL DETACHMENT SURGERY    . RETINAL DETACHMENT SURGERY  07/03/11   right  . TONSILLECTOMY       Current Outpatient Medications  Medication Sig Dispense Refill  . aspirin EC 81 MG tablet Take 81 mg by mouth daily.    . Calcium-Vitamin D-Vitamin K [id number] MG-UNT-MCG CHEW Chew 1 each by mouth daily.     Marland Kitchen escitalopram (LEXAPRO) 10 MG tablet Take 10 mg by mouth daily.    Marland Kitchen JANUMET 50-1000 MG tablet Take 1 tablet by mouth 2 (two) times daily with a meal.     . Multiple Vitamins-Minerals (CENTRUM SILVER ULTRA WOMENS PO) Take 1 tablet by mouth daily.    Marland Kitchen omeprazole (PRILOSEC) 20 MG capsule Take 20 mg by mouth daily.    . ramipril (ALTACE) 10 MG capsule Take 10 mg by mouth daily.    . simvastatin (ZOCOR) 40 MG tablet Take 40 mg by mouth daily.     No current facility-administered medications for this visit.    Allergies:   Clopidogrel bisulfate  and Oxycontin [oxycodone hcl]    Social History:  The patient  reports that she has never smoked. She has never used smokeless tobacco. She reports that she does not drink alcohol and does not use drugs.   Family History:  The patient's family history includes Cancer in her brother; Diabetes in her brother, sister, and sister; Heart disease in her brother and mother; Ovarian cancer in her mother; Stroke in her father.    ROS:  Please see the history of present illness.   Otherwise, review of systems are positive for none.   All other systems are reviewed and negative.    PHYSICAL EXAM: VS:  BP 110/68   Pulse (!) 109   Ht 5\' 5"  (1.651 m)   Wt 83.9 kg   SpO2 96%   BMI 30.79 kg/m  , BMI Body mass index is 30.79 kg/m. Affect appropriate Healthy:  appears stated age 9: normal Neck supple with no adenopathy JVP normal no bruits no thyromegaly Lungs  clear with no wheezing and good diaphragmatic motion Heart:  S1/S2 no murmur, no rub, gallop or click PMI normal Abdomen: benighn, BS positve, no tenderness, no AAA no bruit.  No HSM or HJR Distal pulses intact with no bruits No edema Neuro non-focal Skin warm and dry No muscular weakness   EKG:  2014 SR rate 83  LAFB otherwise normal  04/03/16 SR old IMI no acute changes 03/30/17 SR rate 90 poor R wave progression  08/13/2020 ST rate 109 LAFB poor R wave progression no acute changes    Recent Labs: No results found for requested labs within last 8760 hours.    Lipid Panel No results found for: CHOL, TRIG, HDL, CHOLHDL, VLDL, LDLCALC, LDLDIRECT    Wt Readings from Last 3 Encounters:  08/13/20 83.9 kg  06/14/19 80.3 kg  05/17/18 87.5 kg      Other studies Reviewed: Additional studies/ records that were reviewed today include: Notes / Cath Dr Fletcher Anon and [facility] Situ Notes primary Dr Leveda Anna .    ASSESSMENT AND PLAN:  1.  CAD  Distant stent RCA 2001 non ischemic myovue 03/2016 continue medical RX  2. Chol: on statin labs with primary  3. HTN: Well controlled.  Continue current medications and low sodium Dash type diet.   4. DM: Discussed low carb diet and portion control f/u primary target A1c 6.0 5. Obesity  Discussed diet and exercise  6. Tachycardia:  HR down to 88 after exam see below regarding labs    Current medicines are reviewed at length with the patient today.  The patient does not have concerns regarding medicines.  The following changes have been made:  no change  Labs/ tests ordered today include: None    Orders Placed This Encounter  Procedures  . EKG 12-Lead     Disposition:   FU with me in a year Labs with primary in March check Hct/TSH/Lipids    Signed, Jenkins Rouge, MD  08/13/2020 1:41 PM    [facility] [place], [place], [place]  [address] Phone: [phone]; Fax: [phone]

## 2020-08-13 ENCOUNTER — Ambulatory Visit (INDEPENDENT_AMBULATORY_CARE_PROVIDER_SITE_OTHER): Payer: Medicare HMO | Admitting: Cardiovascular Disease

## 2020-08-13 ENCOUNTER — Other Ambulatory Visit: Payer: Self-pay

## 2020-08-13 ENCOUNTER — Encounter: Payer: Self-pay | Admitting: Cardiovascular Disease

## 2020-08-13 VITALS — BP 110/68 | HR 109 | Ht 65.0 in | Wt 185.0 lb

## 2020-08-13 DIAGNOSIS — I2510 Atherosclerotic heart disease of native coronary artery without angina pectoris: Secondary | ICD-10-CM | POA: Diagnosis not present

## 2020-08-13 DIAGNOSIS — I10 Essential (primary) hypertension: Secondary | ICD-10-CM

## 2020-08-13 MED ORDER — NITROGLYCERIN 0.4 MG SL SUBL: 0.4000 mg | SUBLINGUAL_TABLET | SUBLINGUAL | 3 refills | Status: DC | PRN

## 2020-08-13 NOTE — Patient Instructions (Signed)
Medication Instructions:  Your physician recommends that you continue on your current medications as directed. Please refer to the Current Medication list given to you today. Take nitroglycerin as directed  *If you need a refill on your cardiac medications before your next appointment, please call your pharmacy*   Lab Work: None today If you have labs (blood work) drawn today and your tests are completely normal, you will receive your results only by: Marland Kitchen MyChart Message (if you have MyChart) OR . A paper copy in the mail If you have any lab test that is abnormal or we need to change your treatment, we will call you to review the results.   Testing/Procedures: None today   Follow-Up: At [facility], you and your health needs are our priority.  As part of our continuing mission to provide you with exceptional heart care, we have created designated Provider Care Teams.  These Care Teams include your primary Cardiologist (physician) and Advanced Practice Providers (APPs -  Physician Assistants and Nurse Practitioners) who all work together to provide you with the care you need, when you need it.  We recommend signing up for the patient portal called "MyChart".  Sign up information is provided on this After Visit Summary.  MyChart is used to connect with patients for Virtual Visits (Telemedicine).  Patients are able to view lab/test results, encounter notes, upcoming appointments, etc.  Non-urgent messages can be sent to your provider as well.   To learn more about what you can do with MyChart, go to [email or web address].    Your next appointment:   12 month(s)  The format for your next appointment:   In Person  Provider:   Jenkins Rouge, MD   Other Instructions Nitroglycerin sublingual tablets What is this medicine? NITROGLYCERIN (nye troe GLI ser in) is a type of vasodilator. It relaxes blood vessels, increasing the blood and oxygen supply to your heart. This medicine is used  to relieve chest pain caused by angina. It is also used to prevent chest pain before activities like climbing stairs, going outdoors in cold weather, or sexual activity. This medicine may be used for other purposes; ask your health care provider or pharmacist if you have questions. COMMON BRAND NAME(S): Nitroquick, Nitrostat, Nitrotab What should I tell my health care provider before I take this medicine? They need to know if you have any of these conditions:  anemia  head injury, recent stroke, or bleeding in the brain  liver disease  previous heart attack  an unusual or allergic reaction to nitroglycerin, other medicines, foods, dyes, or preservatives  pregnant or trying to get pregnant  breast-feeding How should I use this medicine? Take this medicine by mouth as needed. Use at the first sign of an angina attack (chest pain or tightness). You can also take this medicine 5 to 10 minutes before an event likely to produce chest pain. Follow the directions exactly as written on the prescription label. Place one tablet under your tongue and let it dissolve. Do not swallow whole. Replace the dose if you accidentally swallow it. It will help if your mouth is not dry. Saliva around the tablet will help it to dissolve more quickly. Do not eat or drink, smoke or chew tobacco while a tablet is dissolving. Sit down when taking this medicine. In an angina attack, you should feel better within 5 minutes after your first dose. You can take a dose every 5 minutes up to a total of 3 doses. If you do  not feel better or feel worse after 1 dose, call 9-1-1 at once. Do not take more than 3 doses in 15 minutes. Your health care provider might give you other directions. Follow those directions if he or she does. Do not take your medicine more often than directed. Talk to your health care provider about the use of this medicine in children. Special care may be needed. Overdosage: If you think you have taken too  much of this medicine contact a poison control center or emergency room at once. NOTE: This medicine is only for you. Do not share this medicine with others. What if I miss a dose? This does not apply. This medicine is only used as needed. What may interact with this medicine? Do not take this medicine with any of the following medications:  certain migraine medicines like ergotamine and dihydroergotamine (DHE)  medicines used to treat erectile dysfunction like sildenafil, tadalafil, and vardenafil  riociguat This medicine may also interact with the following medications:  alteplase  aspirin  heparin  medicines for high blood pressure  medicines for mental depression  other medicines used to treat angina  phenothiazines like chlorpromazine, mesoridazine, prochlorperazine, thioridazine This list may not describe all possible interactions. Give your health care provider a list of all the medicines, herbs, non-prescription drugs, or dietary supplements you use. Also tell them if you smoke, drink alcohol, or use illegal drugs. Some items may interact with your medicine. What should I watch for while using this medicine? Tell your doctor or health care professional if you feel your medicine is no longer working. Keep this medicine with you at all times. Sit or lie down when you take your medicine to prevent falling if you feel dizzy or faint after using it. Try to remain calm. This will help you to feel better faster. If you feel dizzy, take several deep breaths and lie down with your feet propped up, or bend forward with your head resting between your knees. You may get drowsy or dizzy. Do not drive, use machinery, or do anything that needs mental alertness until you know how this drug affects you. Do not stand or sit up quickly, especially if you are an older patient. This reduces the risk of dizzy or fainting spells. Alcohol can make you more drowsy and dizzy. Avoid alcoholic drinks. Do  not treat yourself for coughs, colds, or pain while you are taking this medicine without asking your doctor or health care professional for advice. Some ingredients may increase your blood pressure. What side effects may I notice from receiving this medicine? Side effects that you should report to your doctor or health care professional as soon as possible:  allergic reactions (skin rash, itching or hives; swelling of the face, lips, or tongue)  low blood pressure (dizziness; feeling faint or lightheaded, falls; unusually weak or tired)  low red blood cell counts (trouble breathing; feeling faint; lightheaded, falls; unusually weak or tired) Side effects that usually do not require medical attention (report to your doctor or health care professional if they continue or are bothersome):  facial flushing (redness)  headache  nausea, vomiting This list may not describe all possible side effects. Call your doctor for medical advice about side effects. You may report side effects to FDA at 1-800-FDA-1088. Where should I keep my medicine? Keep out of the reach of children. Store at room temperature between 20 and 25 degrees C (68 and 77 degrees F). Store in Chief of Staff. Protect from light and  moisture. Keep tightly closed. Throw away any unused medicine after the expiration date. NOTE: This sheet is a summary. It may not cover all possible information. If you have questions about this medicine, talk to your doctor, pharmacist, or health care provider.  2021 Elsevier/Gold Standard (2018-03-10 16:46:32)

## 2020-09-19 DIAGNOSIS — I2510 Atherosclerotic heart disease of native coronary artery without angina pectoris: Secondary | ICD-10-CM | POA: Diagnosis not present

## 2020-09-19 DIAGNOSIS — E039 Hypothyroidism, unspecified: Secondary | ICD-10-CM | POA: Diagnosis not present

## 2020-09-19 DIAGNOSIS — Z1389 Encounter for screening for other disorder: Secondary | ICD-10-CM | POA: Diagnosis not present

## 2020-09-19 DIAGNOSIS — Z0001 Encounter for general adult medical examination with abnormal findings: Secondary | ICD-10-CM | POA: Diagnosis not present

## 2020-09-19 DIAGNOSIS — E559 Vitamin D deficiency, unspecified: Secondary | ICD-10-CM | POA: Diagnosis not present

## 2020-09-19 DIAGNOSIS — E118 Type 2 diabetes mellitus with unspecified complications: Secondary | ICD-10-CM | POA: Diagnosis not present

## 2020-09-19 DIAGNOSIS — Z1331 Encounter for screening for depression: Secondary | ICD-10-CM | POA: Diagnosis not present

## 2020-09-19 DIAGNOSIS — E7849 Other hyperlipidemia: Secondary | ICD-10-CM | POA: Diagnosis not present

## 2020-09-19 DIAGNOSIS — C50919 Malignant neoplasm of unspecified site of unspecified female breast: Secondary | ICD-10-CM | POA: Diagnosis not present

## 2020-09-19 DIAGNOSIS — Z6831 Body mass index (BMI) 31.0-31.9, adult: Secondary | ICD-10-CM | POA: Diagnosis not present

## 2020-09-19 DIAGNOSIS — E6609 Other obesity due to excess calories: Secondary | ICD-10-CM | POA: Diagnosis not present

## 2020-09-19 DIAGNOSIS — J45909 Unspecified asthma, uncomplicated: Secondary | ICD-10-CM | POA: Diagnosis not present

## 2020-11-28 DIAGNOSIS — Z6831 Body mass index (BMI) 31.0-31.9, adult: Secondary | ICD-10-CM | POA: Diagnosis not present

## 2020-11-28 DIAGNOSIS — E6609 Other obesity due to excess calories: Secondary | ICD-10-CM | POA: Diagnosis not present

## 2020-11-28 DIAGNOSIS — E1165 Type 2 diabetes mellitus with hyperglycemia: Secondary | ICD-10-CM | POA: Diagnosis not present

## 2020-11-28 DIAGNOSIS — N3001 Acute cystitis with hematuria: Secondary | ICD-10-CM | POA: Diagnosis not present

## 2020-11-28 DIAGNOSIS — I10 Essential (primary) hypertension: Secondary | ICD-10-CM | POA: Diagnosis not present

## 2020-12-07 DIAGNOSIS — N390 Urinary tract infection, site not specified: Secondary | ICD-10-CM | POA: Diagnosis not present

## 2020-12-14 DIAGNOSIS — M9903 Segmental and somatic dysfunction of lumbar region: Secondary | ICD-10-CM | POA: Diagnosis not present

## 2020-12-14 DIAGNOSIS — M62830 Muscle spasm of back: Secondary | ICD-10-CM | POA: Diagnosis not present

## 2020-12-14 DIAGNOSIS — M9902 Segmental and somatic dysfunction of thoracic region: Secondary | ICD-10-CM | POA: Diagnosis not present

## 2020-12-14 DIAGNOSIS — M9905 Segmental and somatic dysfunction of pelvic region: Secondary | ICD-10-CM | POA: Diagnosis not present

## 2020-12-17 DIAGNOSIS — M9905 Segmental and somatic dysfunction of pelvic region: Secondary | ICD-10-CM | POA: Diagnosis not present

## 2020-12-17 DIAGNOSIS — M62830 Muscle spasm of back: Secondary | ICD-10-CM | POA: Diagnosis not present

## 2020-12-17 DIAGNOSIS — M9903 Segmental and somatic dysfunction of lumbar region: Secondary | ICD-10-CM | POA: Diagnosis not present

## 2020-12-17 DIAGNOSIS — M9902 Segmental and somatic dysfunction of thoracic region: Secondary | ICD-10-CM | POA: Diagnosis not present

## 2020-12-18 DIAGNOSIS — I10 Essential (primary) hypertension: Secondary | ICD-10-CM | POA: Diagnosis not present

## 2020-12-18 DIAGNOSIS — N762 Acute vulvitis: Secondary | ICD-10-CM | POA: Diagnosis not present

## 2020-12-18 DIAGNOSIS — N3000 Acute cystitis without hematuria: Secondary | ICD-10-CM | POA: Diagnosis not present

## 2020-12-18 DIAGNOSIS — E669 Obesity, unspecified: Secondary | ICD-10-CM | POA: Diagnosis not present

## 2020-12-18 DIAGNOSIS — Z6830 Body mass index (BMI) 30.0-30.9, adult: Secondary | ICD-10-CM | POA: Diagnosis not present

## 2020-12-19 DIAGNOSIS — M9903 Segmental and somatic dysfunction of lumbar region: Secondary | ICD-10-CM | POA: Diagnosis not present

## 2020-12-19 DIAGNOSIS — M9902 Segmental and somatic dysfunction of thoracic region: Secondary | ICD-10-CM | POA: Diagnosis not present

## 2020-12-19 DIAGNOSIS — M9905 Segmental and somatic dysfunction of pelvic region: Secondary | ICD-10-CM | POA: Diagnosis not present

## 2020-12-19 DIAGNOSIS — M62830 Muscle spasm of back: Secondary | ICD-10-CM | POA: Diagnosis not present

## 2020-12-25 DIAGNOSIS — M62830 Muscle spasm of back: Secondary | ICD-10-CM | POA: Diagnosis not present

## 2020-12-25 DIAGNOSIS — M9903 Segmental and somatic dysfunction of lumbar region: Secondary | ICD-10-CM | POA: Diagnosis not present

## 2020-12-25 DIAGNOSIS — M9902 Segmental and somatic dysfunction of thoracic region: Secondary | ICD-10-CM | POA: Diagnosis not present

## 2020-12-25 DIAGNOSIS — M9905 Segmental and somatic dysfunction of pelvic region: Secondary | ICD-10-CM | POA: Diagnosis not present

## 2020-12-28 DIAGNOSIS — M9902 Segmental and somatic dysfunction of thoracic region: Secondary | ICD-10-CM | POA: Diagnosis not present

## 2020-12-28 DIAGNOSIS — M62830 Muscle spasm of back: Secondary | ICD-10-CM | POA: Diagnosis not present

## 2020-12-28 DIAGNOSIS — M9903 Segmental and somatic dysfunction of lumbar region: Secondary | ICD-10-CM | POA: Diagnosis not present

## 2020-12-28 DIAGNOSIS — M9905 Segmental and somatic dysfunction of pelvic region: Secondary | ICD-10-CM | POA: Diagnosis not present

## 2021-01-01 DIAGNOSIS — M62830 Muscle spasm of back: Secondary | ICD-10-CM | POA: Diagnosis not present

## 2021-01-01 DIAGNOSIS — M9903 Segmental and somatic dysfunction of lumbar region: Secondary | ICD-10-CM | POA: Diagnosis not present

## 2021-01-01 DIAGNOSIS — M9905 Segmental and somatic dysfunction of pelvic region: Secondary | ICD-10-CM | POA: Diagnosis not present

## 2021-01-01 DIAGNOSIS — M9902 Segmental and somatic dysfunction of thoracic region: Secondary | ICD-10-CM | POA: Diagnosis not present

## 2021-01-04 DIAGNOSIS — M62830 Muscle spasm of back: Secondary | ICD-10-CM | POA: Diagnosis not present

## 2021-01-04 DIAGNOSIS — M9903 Segmental and somatic dysfunction of lumbar region: Secondary | ICD-10-CM | POA: Diagnosis not present

## 2021-01-04 DIAGNOSIS — M9902 Segmental and somatic dysfunction of thoracic region: Secondary | ICD-10-CM | POA: Diagnosis not present

## 2021-01-04 DIAGNOSIS — M9905 Segmental and somatic dysfunction of pelvic region: Secondary | ICD-10-CM | POA: Diagnosis not present

## 2021-01-07 DIAGNOSIS — M9902 Segmental and somatic dysfunction of thoracic region: Secondary | ICD-10-CM | POA: Diagnosis not present

## 2021-01-07 DIAGNOSIS — M9905 Segmental and somatic dysfunction of pelvic region: Secondary | ICD-10-CM | POA: Diagnosis not present

## 2021-01-07 DIAGNOSIS — M62830 Muscle spasm of back: Secondary | ICD-10-CM | POA: Diagnosis not present

## 2021-01-07 DIAGNOSIS — M9903 Segmental and somatic dysfunction of lumbar region: Secondary | ICD-10-CM | POA: Diagnosis not present

## 2021-01-11 DIAGNOSIS — M9902 Segmental and somatic dysfunction of thoracic region: Secondary | ICD-10-CM | POA: Diagnosis not present

## 2021-01-11 DIAGNOSIS — R319 Hematuria, unspecified: Secondary | ICD-10-CM | POA: Diagnosis not present

## 2021-01-11 DIAGNOSIS — E6609 Other obesity due to excess calories: Secondary | ICD-10-CM | POA: Diagnosis not present

## 2021-01-11 DIAGNOSIS — M9903 Segmental and somatic dysfunction of lumbar region: Secondary | ICD-10-CM | POA: Diagnosis not present

## 2021-01-11 DIAGNOSIS — M9905 Segmental and somatic dysfunction of pelvic region: Secondary | ICD-10-CM | POA: Diagnosis not present

## 2021-01-11 DIAGNOSIS — Z6830 Body mass index (BMI) 30.0-30.9, adult: Secondary | ICD-10-CM | POA: Diagnosis not present

## 2021-01-11 DIAGNOSIS — M62830 Muscle spasm of back: Secondary | ICD-10-CM | POA: Diagnosis not present

## 2021-01-11 DIAGNOSIS — R39198 Other difficulties with micturition: Secondary | ICD-10-CM | POA: Diagnosis not present

## 2021-01-18 DIAGNOSIS — M9905 Segmental and somatic dysfunction of pelvic region: Secondary | ICD-10-CM | POA: Diagnosis not present

## 2021-01-18 DIAGNOSIS — M62830 Muscle spasm of back: Secondary | ICD-10-CM | POA: Diagnosis not present

## 2021-01-18 DIAGNOSIS — M9902 Segmental and somatic dysfunction of thoracic region: Secondary | ICD-10-CM | POA: Diagnosis not present

## 2021-01-18 DIAGNOSIS — M9903 Segmental and somatic dysfunction of lumbar region: Secondary | ICD-10-CM | POA: Diagnosis not present

## 2021-01-25 DIAGNOSIS — M9905 Segmental and somatic dysfunction of pelvic region: Secondary | ICD-10-CM | POA: Diagnosis not present

## 2021-01-25 DIAGNOSIS — M62830 Muscle spasm of back: Secondary | ICD-10-CM | POA: Diagnosis not present

## 2021-01-25 DIAGNOSIS — M9902 Segmental and somatic dysfunction of thoracic region: Secondary | ICD-10-CM | POA: Diagnosis not present

## 2021-01-25 DIAGNOSIS — M9903 Segmental and somatic dysfunction of lumbar region: Secondary | ICD-10-CM | POA: Diagnosis not present

## 2021-02-01 ENCOUNTER — Ambulatory Visit: Payer: Medicare HMO | Admitting: Urology

## 2021-02-14 DIAGNOSIS — D225 Melanocytic nevi of trunk: Secondary | ICD-10-CM | POA: Diagnosis not present

## 2021-02-14 DIAGNOSIS — Z1283 Encounter for screening for malignant neoplasm of skin: Secondary | ICD-10-CM | POA: Diagnosis not present

## 2021-02-14 DIAGNOSIS — X32XXXD Exposure to sunlight, subsequent encounter: Secondary | ICD-10-CM | POA: Diagnosis not present

## 2021-02-14 DIAGNOSIS — L570 Actinic keratosis: Secondary | ICD-10-CM | POA: Diagnosis not present

## 2021-02-15 ENCOUNTER — Encounter: Payer: Self-pay | Admitting: Urology

## 2021-02-15 ENCOUNTER — Ambulatory Visit (INDEPENDENT_AMBULATORY_CARE_PROVIDER_SITE_OTHER): Payer: Medicare HMO | Admitting: Urology

## 2021-02-15 ENCOUNTER — Other Ambulatory Visit: Payer: Self-pay

## 2021-02-15 VITALS — BP 161/86 | HR 90

## 2021-02-15 DIAGNOSIS — R350 Frequency of micturition: Secondary | ICD-10-CM

## 2021-02-15 DIAGNOSIS — R310 Gross hematuria: Secondary | ICD-10-CM | POA: Diagnosis not present

## 2021-02-15 DIAGNOSIS — N3021 Other chronic cystitis with hematuria: Secondary | ICD-10-CM

## 2021-02-15 LAB — URINALYSIS, ROUTINE W REFLEX MICROSCOPIC
Bilirubin, UA: NEGATIVE
Ketones, UA: NEGATIVE
Leukocytes,UA: NEGATIVE
Nitrite, UA: NEGATIVE
Protein,UA: NEGATIVE
RBC, UA: NEGATIVE
Specific Gravity, UA: <1.005 — ABNORMAL LOW (ref 1.005–1.030)
Urobilinogen, Ur: 1 mg/dL (ref 0.2–1.0)
pH, UA: 6 (ref 5.0–7.5)

## 2021-02-15 LAB — BLADDER SCAN AMB NON-IMAGING: Scan Result: 0

## 2021-02-15 NOTE — Progress Notes (Signed)
02/15/2021 1:14 PM   SHONELLE TILLMON 01-30-1946 [id number]  Referring provider: Sharilyn Sites, [address] [place],  [place] [address]  Gross hematuria  HPI: [name] is a 75yo here for evaluation of gross hematuria. 2 months ago she developed gross hematuria with associated dysuria. She presented to Dr. Delanna Ahmadi office and was diagnosed with a UTI. Hematuria resolved within 1 day. She was then diagnosed with 2 more UTIs in the past 7 weeks. No culture results are available. She has a hx of DMII and recently her DMII has been less controlled. UA today shows no blood but she does have 3+ glucose. She has urinary urgency.    PMH: Past Medical History:  Diagnosis Date   Breast cancer ([place])    Breast cancer, stage 2, right ([place]) 09/06/2009   Qualifier: Diagnosis of  By: Orville Govern, CMA, Carol     Coronary artery disease    Diabetes mellitus without complication ([place])    Fatty liver disease, nonalcoholic    GERD (gastroesophageal reflux disease)    Hypertension    PONV (postoperative nausea and vomiting)    Nausea/ Vomitting- last time 2010   Stented coronary artery     Surgical History: Past Surgical History:  Procedure Laterality Date   ABDOMINAL HYSTERECTOMY     BREAST SURGERY  March 2010   cataract  Right 09/2012   CATARACT EXTRACTION  01/01/2018   left eye   FRACTURE SURGERY     wrist  right   PARS PLANA VITRECTOMY  07/03/2011   Procedure: PARS PLANA VITRECTOMY WITH 25 GAUGE;  Surgeon: Hayden Pedro, MD;  Location: [facility];  Service: Ophthalmology;  Laterality: Right;  MEMBRANE PEEL, HEAD SCOPE LASER, GAS   RETINAL DETACHMENT SURGERY     RETINAL DETACHMENT SURGERY  07/03/11   right   TONSILLECTOMY      Home Medications:  Allergies as of 02/15/2021       Reactions   Clopidogrel Bisulfate Hives   Oxycontin [oxycodone Hcl] Nausea And Vomiting        Medication List        Accurate as of February 15, 2021  1:14 PM. If you have any questions, ask your  nurse or doctor.          aspirin EC 81 MG tablet Take 81 mg by mouth daily.   Calcium-Vitamin D-Vitamin K [id number] MG-UNT-MCG Chew Chew 1 each by mouth daily.   CENTRUM SILVER ULTRA WOMENS PO Take 1 tablet by mouth daily.   escitalopram 10 MG tablet Commonly known as: LEXAPRO Take 10 mg by mouth daily.   Janumet 50-1000 MG tablet Generic drug: sitaGLIPtin-metformin Take 1 tablet by mouth 2 (two) times daily with a meal.   nitroGLYCERIN 0.4 MG SL tablet Commonly known as: NITROSTAT Place 1 tablet (0.4 mg total) under the tongue every 5 (five) minutes as needed for chest pain.   omeprazole 20 MG capsule Commonly known as: PRILOSEC Take 20 mg by mouth daily.   ramipril 10 MG capsule Commonly known as: ALTACE Take 10 mg by mouth daily.   simvastatin 40 MG tablet Commonly known as: ZOCOR Take 40 mg by mouth daily.        Allergies:  Allergies  Allergen Reactions   Clopidogrel Bisulfate Hives   Oxycontin [Oxycodone Hcl] Nausea And Vomiting    Family History: Family History  Problem Relation Age of Onset   Heart disease Mother    Ovarian cancer Mother    Stroke Father  Diabetes Sister    Diabetes Brother    Diabetes Sister    Heart disease Brother    Cancer Brother        spine   Anesthesia problems Neg Hx     Social History:  reports that she has never smoked. She has never used smokeless tobacco. She reports that she does not drink alcohol and does not use drugs.  ROS: All other review of systems were reviewed and are negative except what is noted above in HPI  Physical Exam: BP (!) 161/86   Pulse 90   Constitutional:  Alert and oriented, No acute distress. HEENT:  AT, moist mucus membranes.  Trachea midline, no masses. Cardiovascular: No clubbing, cyanosis, or edema. Respiratory: Normal respiratory effort, no increased work of breathing. GI: Abdomen is soft, nontender, nondistended, no abdominal masses GU: No CVA tenderness.  Lymph: No  cervical or inguinal lymphadenopathy. Skin: No rashes, bruises or suspicious lesions. Neurologic: Grossly intact, no focal deficits, moving all 4 extremities. Psychiatric: Normal mood and affect.  Laboratory Data: Lab Results  Component Value Date   WBC 11.0 (H) 07/17/2017   HGB 15.1 (H) 07/17/2017   HCT 47.1 (H) 07/17/2017   MCV 86.4 07/17/2017   PLT 214 07/17/2017    Lab Results  Component Value Date   CREATININE 1.20 (H) 07/17/2017    No results found for: PSA  No results found for: TESTOSTERONE  No results found for: HGBA1C  Urinalysis No results found for: COLORURINE, APPEARANCEUR, LABSPEC, PHURINE, GLUCOSEU, HGBUR, BILIRUBINUR, KETONESUR, PROTEINUR, UROBILINOGEN, NITRITE, LEUKOCYTESUR  No results found for: LABMICR, [employer], RBCUA, LABEPIT, MUCUS, BACTERIA  Pertinent Imaging:  No results found for this or any previous visit.  No results found for this or any previous visit.  No results found for this or any previous visit.  No results found for this or any previous visit.  No results found for this or any previous visit.  No results found for this or any previous visit.  No results found for this or any previous visit.  No results found for this or any previous visit.   Assessment & Plan:    1. Urinary frequency -Likely related to glycosuria. Patient counseled on DMII control - BLADDER SCAN AMB NON-IMAGING - Urinalysis, Routine w reflex microscopic  2. Chronic cystitis with hematuria -Likely related to glycosuria. Patient counseled on DMII control.   3. Gross hematuria -I will see her back in 6 months with a UA   No follow-ups on file.  Nicolette Bang, MD  [facility] [place]

## 2021-02-15 NOTE — Patient Instructions (Signed)

## 2021-02-15 NOTE — Progress Notes (Signed)
Urological Symptom Review PVR 0 Patient is experiencing the following symptoms: Frequent urination Get up at night to urinate Leakage of urine Blood in urine Urinary tract infection   Review of Systems  Gastrointestinal (upper)  : Indigestion/heartburn  Gastrointestinal (lower) : Negative for lower GI symptoms  Constitutional : Fatigue  Skin: Negative for skin symptoms  Eyes: Negative for eye symptoms  Ear/Nose/Throat : Negative for Ear/Nose/Throat symptoms  Hematologic/Lymphatic: Negative for Hematologic/Lymphatic symptoms  Cardiovascular : Negative for cardiovascular symptoms  Respiratory : Negative for respiratory symptoms  Endocrine: Negative for endocrine symptoms  Musculoskeletal: Negative for musculoskeletal symptoms  Neurological: Negative for neurological symptoms  Psychologic: Negative for psychiatric symptoms

## 2021-03-01 DIAGNOSIS — M9903 Segmental and somatic dysfunction of lumbar region: Secondary | ICD-10-CM | POA: Diagnosis not present

## 2021-03-01 DIAGNOSIS — M62830 Muscle spasm of back: Secondary | ICD-10-CM | POA: Diagnosis not present

## 2021-03-01 DIAGNOSIS — M9902 Segmental and somatic dysfunction of thoracic region: Secondary | ICD-10-CM | POA: Diagnosis not present

## 2021-03-01 DIAGNOSIS — M9905 Segmental and somatic dysfunction of pelvic region: Secondary | ICD-10-CM | POA: Diagnosis not present

## 2021-03-04 DIAGNOSIS — Z6830 Body mass index (BMI) 30.0-30.9, adult: Secondary | ICD-10-CM | POA: Diagnosis not present

## 2021-03-04 DIAGNOSIS — K7689 Other specified diseases of liver: Secondary | ICD-10-CM | POA: Diagnosis not present

## 2021-03-04 DIAGNOSIS — E559 Vitamin D deficiency, unspecified: Secondary | ICD-10-CM | POA: Diagnosis not present

## 2021-03-04 DIAGNOSIS — E785 Hyperlipidemia, unspecified: Secondary | ICD-10-CM | POA: Diagnosis not present

## 2021-03-04 DIAGNOSIS — E118 Type 2 diabetes mellitus with unspecified complications: Secondary | ICD-10-CM | POA: Diagnosis not present

## 2021-03-04 DIAGNOSIS — I2510 Atherosclerotic heart disease of native coronary artery without angina pectoris: Secondary | ICD-10-CM | POA: Diagnosis not present

## 2021-03-04 DIAGNOSIS — J45909 Unspecified asthma, uncomplicated: Secondary | ICD-10-CM | POA: Diagnosis not present

## 2021-03-04 DIAGNOSIS — E6609 Other obesity due to excess calories: Secondary | ICD-10-CM | POA: Diagnosis not present

## 2021-03-04 DIAGNOSIS — Z23 Encounter for immunization: Secondary | ICD-10-CM | POA: Diagnosis not present

## 2021-03-28 ENCOUNTER — Other Ambulatory Visit (HOSPITAL_COMMUNITY): Payer: Self-pay | Admitting: Family Medicine

## 2021-03-28 DIAGNOSIS — Z1231 Encounter for screening mammogram for malignant neoplasm of breast: Secondary | ICD-10-CM

## 2021-04-10 ENCOUNTER — Ambulatory Visit (HOSPITAL_COMMUNITY)
Admission: RE | Admit: 2021-04-10 | Discharge: 2021-04-10 | Disposition: A | Discharge status: Home / Self Care | Payer: Medicare HMO | Source: Ambulatory Visit | Attending: Family Medicine | Admitting: Family Medicine

## 2021-04-10 ENCOUNTER — Other Ambulatory Visit: Payer: Self-pay

## 2021-04-10 DIAGNOSIS — Z1231 Encounter for screening mammogram for malignant neoplasm of breast: Secondary | ICD-10-CM | POA: Diagnosis not present

## 2021-04-10 DIAGNOSIS — K7689 Other specified diseases of liver: Secondary | ICD-10-CM | POA: Diagnosis not present

## 2021-04-18 DIAGNOSIS — H33303 Unspecified retinal break, bilateral: Secondary | ICD-10-CM | POA: Diagnosis not present

## 2021-04-18 DIAGNOSIS — I10 Essential (primary) hypertension: Secondary | ICD-10-CM | POA: Diagnosis not present

## 2021-04-18 DIAGNOSIS — H5213 Myopia, bilateral: Secondary | ICD-10-CM | POA: Diagnosis not present

## 2021-04-18 DIAGNOSIS — T7840XS Allergy, unspecified, sequela: Secondary | ICD-10-CM | POA: Diagnosis not present

## 2021-04-18 DIAGNOSIS — R739 Hyperglycemia, unspecified: Secondary | ICD-10-CM | POA: Diagnosis not present

## 2021-04-18 DIAGNOSIS — E119 Type 2 diabetes mellitus without complications: Secondary | ICD-10-CM | POA: Diagnosis not present

## 2021-04-29 ENCOUNTER — Encounter: Payer: Self-pay | Admitting: *Deleted

## 2021-05-21 ENCOUNTER — Ambulatory Visit (INDEPENDENT_AMBULATORY_CARE_PROVIDER_SITE_OTHER): Payer: Self-pay | Admitting: *Deleted

## 2021-05-21 ENCOUNTER — Encounter: Payer: Self-pay | Admitting: *Deleted

## 2021-05-21 ENCOUNTER — Other Ambulatory Visit: Payer: Self-pay

## 2021-05-21 VITALS — Ht 65.0 in | Wt 179.4 lb

## 2021-05-21 DIAGNOSIS — Z1211 Encounter for screening for malignant neoplasm of colon: Secondary | ICD-10-CM

## 2021-05-21 MED ORDER — CLENPIQ 10-3.5-12 MG-GM -GM/160ML PO SOLN: 1.0000 | Freq: Once | ORAL | 0 refills | Status: AC

## 2021-05-21 NOTE — Progress Notes (Signed)
Gastroenterology Pre-Procedure Review  Request Date: 05/21/2021 Requesting Physician: 10 year recall, Last TCS done 12/19/2009 by Dr. Oneida Alar, small descending colon polyp, small internal hemorrhoids, gastric polyps  PATIENT REVIEW QUESTIONS: The patient responded to the following health history questions as indicated:    1. Diabetes Melitis: yes, type II  2. Joint replacements in the past 12 months: no 3. Major health problems in the past 3 months: no 4. Has an artificial valve or MVP: no 5. Has a defibrillator: no 6. Has been advised in past to take antibiotics in advance of a procedure like teeth cleaning: no 7. Family history of colon cancer: no 8. Alcohol Use: no 9. Illicit drug Use: no 10. History of sleep apnea: no 11. History of coronary artery or other vascular stents placed within the last 12 months: no, but had stents placed in 2001 12. History of any prior anesthesia complications: no 13. Body mass index is 29.85 kg/m.    MEDICATIONS & ALLERGIES:    Patient reports the following regarding taking any blood thinners:   Plavix? no Aspirin? Yes, 81 mg Coumadin? no Brilinta? no Xarelto? no Eliquis? no Pradaxa? no Savaysa? no Effient? no  Patient confirms/reports the following medications:  Current Outpatient Medications  Medication Sig Dispense Refill   aspirin EC 81 MG tablet Take 81 mg by mouth daily.     Calcium-Vitamin D-Vitamin K [id number] MG-UNT-MCG CHEW Chew 1 each by mouth daily.      dapagliflozin propanediol (FARXIGA) 10 MG TABS tablet Take by mouth daily.     escitalopram (LEXAPRO) 10 MG tablet Take 10 mg by mouth daily.     JANUMET 50-1000 MG tablet Take 1 tablet by mouth 2 (two) times daily with a meal.      Multiple Vitamins-Minerals (CENTRUM SILVER ULTRA WOMENS PO) Take 1 tablet by mouth daily.     Omega-3 Fatty Acids (FISH OIL PO) Take by mouth daily at 6 (six) AM.     omeprazole (PRILOSEC) 20 MG capsule Take 20 mg by mouth daily.     ramipril  (ALTACE) 10 MG capsule Take 10 mg by mouth daily.     simvastatin (ZOCOR) 40 MG tablet Take 40 mg by mouth daily.     vitamin C (ASCORBIC ACID) 250 MG tablet Take 250 mg by mouth daily.     zinc gluconate 50 MG tablet Take 50 mg by mouth daily.     nitroGLYCERIN (NITROSTAT) 0.4 MG SL tablet Place 1 tablet (0.4 mg total) under the tongue every 5 (five) minutes as needed for chest pain. 25 tablet 3   No current facility-administered medications for this visit.    Patient confirms/reports the following allergies:  Allergies  Allergen Reactions   Clopidogrel Bisulfate Hives   Oxycontin [Oxycodone Hcl] Nausea And Vomiting    No orders of the defined types were placed in this encounter.   AUTHORIZATION INFORMATION Primary Insurance: [facility],  [place] #: [id number],  Group #: [id number] [place] Pre-Cert / Josem Kaufmann required: No, not required  Secondary Insurance: [facility],  [place] #: [id number],  Group #: [id number] Pre-Cert / Josem Kaufmann required: No, not required  SCHEDULE INFORMATION: Procedure has been scheduled as follows:  Date: 06/18/2021, Time: 10:30  Location: [facility] with Dr. Abbey Chatters  This Gastroenterology Pre-Precedure Review Form is being routed to the following provider(s): Aliene Altes, PA-C

## 2021-05-22 NOTE — Progress Notes (Signed)
Patient medical history meets ASA 3 criteria.  She needs an office visit to schedule her colonoscopy.

## 2021-05-23 NOTE — Progress Notes (Signed)
Spoke to pt.  Made her aware that we were cancelling scheduled procedure for 06/18/2021.  She was made aware that it would be rescheduled to another day (ASA 3) when she comes in for ov.

## 2021-05-23 NOTE — Progress Notes (Signed)
Scheduled ov for 06/05/2021 at 4:00 with Dr. Abbey Chatters.  Pt confirmed appt.

## 2021-05-23 NOTE — Progress Notes (Signed)
Tried to call pt but vm not set up.

## 2021-06-03 DIAGNOSIS — M9905 Segmental and somatic dysfunction of pelvic region: Secondary | ICD-10-CM | POA: Diagnosis not present

## 2021-06-03 DIAGNOSIS — M9903 Segmental and somatic dysfunction of lumbar region: Secondary | ICD-10-CM | POA: Diagnosis not present

## 2021-06-03 DIAGNOSIS — M9902 Segmental and somatic dysfunction of thoracic region: Secondary | ICD-10-CM | POA: Diagnosis not present

## 2021-06-03 DIAGNOSIS — M62830 Muscle spasm of back: Secondary | ICD-10-CM | POA: Diagnosis not present

## 2021-06-05 ENCOUNTER — Ambulatory Visit (INDEPENDENT_AMBULATORY_CARE_PROVIDER_SITE_OTHER): Payer: Medicare HMO | Admitting: Internal Medicine

## 2021-06-05 ENCOUNTER — Other Ambulatory Visit: Payer: Self-pay

## 2021-06-05 ENCOUNTER — Encounter: Payer: Self-pay | Admitting: Internal Medicine

## 2021-06-05 VITALS — BP 163/84 | HR 75 | Temp 97.1°F | Ht 65.0 in | Wt 176.4 lb

## 2021-06-05 DIAGNOSIS — Z1211 Encounter for screening for malignant neoplasm of colon: Secondary | ICD-10-CM | POA: Diagnosis not present

## 2021-06-05 DIAGNOSIS — K219 Gastro-esophageal reflux disease without esophagitis: Secondary | ICD-10-CM

## 2021-06-05 NOTE — Progress Notes (Signed)
Primary Care Physician:  Sharilyn Sites, MD Primary Gastroenterologist:  Dr. Abbey Chatters  Chief Complaint  Patient presents with   Colonoscopy    Due for tcs. Doing ok    HPI:   Erika Ramos is a 75 y.o. 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   Qualifier: Diagnosis of  By: Orville Govern, CMA, Carol     Coronary artery disease    Diabetes mellitus without complication ([place])    Fatty liver disease, nonalcoholic    GERD (gastroesophageal reflux disease)    Hypertension    PONV (postoperative nausea and vomiting)    Nausea/ Vomitting- last time 2010   Stented coronary artery     Past Surgical History:  Procedure Laterality Date   ABDOMINAL HYSTERECTOMY     BREAST SURGERY  March 2010   cataract  Right 09/2012   CATARACT EXTRACTION  01/01/2018   left eye   FRACTURE SURGERY     wrist  right   PARS PLANA VITRECTOMY  07/03/2011   Procedure: PARS PLANA VITRECTOMY WITH 25 GAUGE;  Surgeon: Hayden Pedro, MD;  Location: [facility];  Service: Ophthalmology;  Laterality: Right;  MEMBRANE PEEL, HEAD SCOPE LASER, GAS   RETINAL DETACHMENT SURGERY     RETINAL DETACHMENT SURGERY  07/03/11   right   TONSILLECTOMY      Current Outpatient Medications  Medication Sig Dispense Refill   aspirin EC 81 MG tablet Take 81 mg by mouth daily.     Calcium-Vitamin D-Vitamin K [id number] MG-UNT-MCG CHEW Chew 1 each by mouth daily.      Cyanocobalamin (VITAMIN B-12 PO) Take by mouth daily.     dapagliflozin propanediol (FARXIGA) 10 MG TABS tablet Take 10 mg by mouth daily.     escitalopram (LEXAPRO) 10 MG tablet Take 10 mg by mouth daily.     JANUMET 50-1000 MG tablet Take 1 tablet by mouth 2 (two) times daily with a meal.      Multiple Vitamins-Minerals (CENTRUM SILVER ULTRA WOMENS PO) Take 1 tablet by mouth daily.     nitroGLYCERIN (NITROSTAT) 0.4 MG SL tablet Place 1 tablet (0.4 mg total) under the tongue every 5  (five) minutes as needed for chest pain. 25 tablet 3   Omega-3 Fatty Acids (FISH OIL PO) Take by mouth daily at 6 (six) AM.     omeprazole (PRILOSEC) 20 MG capsule Take 20 mg by mouth daily.     ramipril (ALTACE) 10 MG capsule Take 10 mg by mouth daily.     simvastatin (ZOCOR) 40 MG tablet Take 40 mg by mouth daily.     vitamin C (ASCORBIC ACID) 250 MG tablet Take 250 mg by mouth daily.     zinc gluconate 50 MG tablet Take 50 mg by mouth daily.     No current facility-administered medications for this visit.    Allergies as of 06/05/2021 - Review Complete 06/05/2021  Allergen Reaction Noted   Clopidogrel bisulfate Hives 12/03/2009   Oxycontin [oxycodone hcl] Nausea And Vomiting 07/17/2017    Family History  Problem Relation Age of Onset   Heart disease Mother    Ovarian cancer Mother    Stroke Father    Diabetes Sister    Diabetes Brother    Diabetes Sister    Heart disease Brother    Cancer Brother        spine   Anesthesia  problems Neg Hx     Social History   Socioeconomic History   Marital status: Divorced    Spouse name: Not on file   Number of children: Not on file   Years of education: Not on file   Highest education level: Not on file  Occupational History   Not on file  Tobacco Use   Smoking status: Never   Smokeless tobacco: Never  Vaping Use   Vaping Use: Never used  Substance and Sexual Activity   Alcohol use: No   Drug use: No   Sexual activity: Not on file    Comment: single-2 grown children  Other Topics Concern   Not on file  Social History Narrative   Not on file   Social Determinants of Health   Financial Resource Strain: Not on file  Food Insecurity: Not on file  Transportation Needs: Not on file  Physical Activity: Not on file  Stress: Not on file  Social Connections: Not on file  Intimate Partner Violence: Not on file    Subjective: ROS     Objective: BP (!) 163/84    Pulse 75    Temp (!) 97.1 F (36.2 C) (Temporal)    Ht  5\' 5"  (1.651 m)    Wt 176 lb 6.4 oz (80 kg)    BMI 29.35 kg/m  Physical Exam   Assessment: *  Plan:   06/05/2021 4:13 PM   Disclaimer: This note was dictated with voice recognition software. Similar sounding words can inadvertently be transcribed and may not be corrected upon review.

## 2021-06-05 NOTE — H&P (View-Only) (Signed)
Primary Care Physician:  Sharilyn Sites, MD Primary Gastroenterologist:  Dr. Abbey Chatters  Chief Complaint  Patient presents with   Colonoscopy    Due for tcs. Doing ok    HPI:   Erika Ramos is a 75 y.o. female who presents to clinic today by referral from her PCP Dr. Hilma Favors to discuss colonoscopy.  Last colonoscopy 2011 with 1 polyp removed, pathology negative for adenomatous change.  No family history of colorectal malignancy.  No abdominal pain or unintentional weight loss.  No melena hematochezia.  Does have chronic GERD which is well controlled omeprazole daily.  No dysphagia odynophagia.  No epigastric or chest pain.  No nausea or vomiting.  Past Medical History:  Diagnosis Date   Breast cancer [facility])    Breast cancer, stage 2, right ([place]) 09/06/2009   Qualifier: Diagnosis of  By: Orville Govern, CMA, Carol     Coronary artery disease    Diabetes mellitus without complication ([place])    Fatty liver disease, nonalcoholic    GERD (gastroesophageal reflux disease)    Hypertension    PONV (postoperative nausea and vomiting)    Nausea/ Vomitting- last time 2010   Stented coronary artery     Past Surgical History:  Procedure Laterality Date   ABDOMINAL HYSTERECTOMY     BREAST SURGERY  March 2010   cataract  Right 09/2012   CATARACT EXTRACTION  01/01/2018   left eye   FRACTURE SURGERY     wrist  right   PARS PLANA VITRECTOMY  07/03/2011   Procedure: PARS PLANA VITRECTOMY WITH 25 GAUGE;  Surgeon: Hayden Pedro, MD;  Location: [place];  Service: Ophthalmology;  Laterality: Right;  MEMBRANE PEEL, HEAD SCOPE LASER, GAS   RETINAL DETACHMENT SURGERY     RETINAL DETACHMENT SURGERY  07/03/11   right   TONSILLECTOMY      Current Outpatient Medications  Medication Sig Dispense Refill   aspirin EC 81 MG tablet Take 81 mg by mouth daily.     Calcium-Vitamin D-Vitamin K 098-119-14 MG-UNT-MCG CHEW Chew 1 each by mouth daily.      Cyanocobalamin (VITAMIN B-12 PO) Take by mouth daily.      dapagliflozin propanediol (FARXIGA) 10 MG TABS tablet Take 10 mg by mouth daily.     escitalopram (LEXAPRO) 10 MG tablet Take 10 mg by mouth daily.     JANUMET 50-1000 MG tablet Take 1 tablet by mouth 2 (two) times daily with a meal.      Multiple Vitamins-Minerals (CENTRUM SILVER ULTRA WOMENS PO) Take 1 tablet by mouth daily.     nitroGLYCERIN (NITROSTAT) 0.4 MG SL tablet Place 1 tablet (0.4 mg total) under the tongue every 5 (five) minutes as needed for chest pain. 25 tablet 3   Omega-3 Fatty Acids (FISH OIL PO) Take by mouth daily at 6 (six) AM.     omeprazole (PRILOSEC) 20 MG capsule Take 20 mg by mouth daily.     ramipril (ALTACE) 10 MG capsule Take 10 mg by mouth daily.     simvastatin (ZOCOR) 40 MG tablet Take 40 mg by mouth daily.     vitamin C (ASCORBIC ACID) 250 MG tablet Take 250 mg by mouth daily.     zinc gluconate 50 MG tablet Take 50 mg by mouth daily.     No current facility-administered medications for this visit.    Allergies as of 06/05/2021 - Review Complete 06/05/2021  Allergen Reaction Noted   Clopidogrel bisulfate Hives 12/03/2009   Oxycontin [  oxycodone hcl] Nausea And Vomiting 07/17/2017    Family History  Problem Relation Age of Onset   Heart disease Mother    Ovarian cancer Mother    Stroke Father    Diabetes Sister    Diabetes Brother    Diabetes Sister    Heart disease Brother    Cancer Brother        spine   Anesthesia problems Neg Hx     Social History   Socioeconomic History   Marital status: Divorced    Spouse name: Not on file   Number of children: Not on file   Years of education: Not on file   Highest education level: Not on file  Occupational History   Not on file  Tobacco Use   Smoking status: Never   Smokeless tobacco: Never  Vaping Use   Vaping Use: Never used  Substance and Sexual Activity   Alcohol use: No   Drug use: No   Sexual activity: Not on file    Comment: single-2 grown children  Other Topics Concern   Not on  file  Social History Narrative   Not on file   Social Determinants of Health   Financial Resource Strain: Not on file  Food Insecurity: Not on file  Transportation Needs: Not on file  Physical Activity: Not on file  Stress: Not on file  Social Connections: Not on file  Intimate Partner Violence: Not on file    Subjective: Review of Systems  Constitutional:  Negative for chills and fever.  HENT:  Negative for congestion and hearing loss.   Eyes:  Negative for blurred vision and double vision.  Respiratory:  Negative for cough and shortness of breath.   Cardiovascular:  Negative for chest pain and palpitations.  Gastrointestinal:  Positive for heartburn. Negative for abdominal pain, blood in stool, constipation, diarrhea, melena and vomiting.  Genitourinary:  Negative for dysuria and urgency.  Musculoskeletal:  Negative for joint pain and myalgias.  Skin:  Negative for itching and rash.  Neurological:  Negative for dizziness and headaches.  Psychiatric/Behavioral:  Negative for depression. The patient is not nervous/anxious.       Objective: BP (!) 163/84    Pulse 75    Temp (!) 97.1 F (36.2 C) (Temporal)    Ht 5\' 5"  (1.651 m)    Wt 176 lb 6.4 oz (80 kg)    BMI 29.35 kg/m  Physical Exam Constitutional:      Appearance: Normal appearance.  HENT:     Head: Normocephalic and atraumatic.  Eyes:     Extraocular Movements: Extraocular movements intact.     Conjunctiva/sclera: Conjunctivae normal.  Cardiovascular:     Rate and Rhythm: Normal rate and regular rhythm.  Pulmonary:     Effort: Pulmonary effort is normal.     Breath sounds: Normal breath sounds.  Abdominal:     General: Bowel sounds are normal.     Palpations: Abdomen is soft.  Musculoskeletal:        General: No swelling. Normal range of motion.     Cervical back: Normal range of motion and neck supple.  Skin:    General: Skin is warm and dry.     Coloration: Skin is not jaundiced.  Neurological:      General: No focal deficit present.     Mental Status: She is alert and oriented to person, place, and time.  Psychiatric:        Mood and Affect: Mood normal.  Behavior: Behavior normal.     Assessment: *GERD-well-controlled on omeprazole 20 mg daily *Colon cancer screening  Plan: GERD well-controlled on omeprazole.  We will continue.  No alarm symptoms today to warrant further investigation with upper endoscopy.  Will schedule for screening colonoscopy.The risks including infection, bleed, or perforation as well as benefits, limitations, alternatives and imponderables have been reviewed with the patient. Questions have been answered. All parties agreeable.  Given history of cardiac stent placement, ASA 3.  No antiplatelet medication currently besides 81 mg aspirin.  Thank you Dr. Hilma Favors for the kind referral.  06/05/2021 4:32 PM   Disclaimer: This note was dictated with voice recognition software. Similar sounding words can inadvertently be transcribed and may not be corrected upon review.

## 2021-06-05 NOTE — Progress Notes (Signed)
Primary Care Physician:  Sharilyn Sites, MD Primary Gastroenterologist:  Dr. Abbey Chatters  Chief Complaint  Patient presents with   Colonoscopy    Due for tcs. Doing ok    HPI:   Erika Ramos is a 75 y.o. female who presents to clinic today by referral from her PCP Dr. Hilma Favors to discuss colonoscopy.  Last colonoscopy 2011 with 1 polyp removed, pathology negative for adenomatous change.  No family history of colorectal malignancy.  No abdominal pain or unintentional weight loss.  No melena hematochezia.  Does have chronic GERD which is well controlled omeprazole daily.  No dysphagia odynophagia.  No epigastric or chest pain.  No nausea or vomiting.  Past Medical History:  Diagnosis Date   Breast cancer [facility])    Breast cancer, stage 2, right ([place]) 09/06/2009   Qualifier: Diagnosis of  By: Orville Govern, CMA, Carol     Coronary artery disease    Diabetes mellitus without complication ([place])    Fatty liver disease, nonalcoholic    GERD (gastroesophageal reflux disease)    Hypertension    PONV (postoperative nausea and vomiting)    Nausea/ Vomitting- last time 2010   Stented coronary artery     Past Surgical History:  Procedure Laterality Date   ABDOMINAL HYSTERECTOMY     BREAST SURGERY  March 2010   cataract  Right 09/2012   CATARACT EXTRACTION  01/01/2018   left eye   FRACTURE SURGERY     wrist  right   PARS PLANA VITRECTOMY  07/03/2011   Procedure: PARS PLANA VITRECTOMY WITH 25 GAUGE;  Surgeon: Hayden Pedro, MD;  Location: [place];  Service: Ophthalmology;  Laterality: Right;  MEMBRANE PEEL, HEAD SCOPE LASER, GAS   RETINAL DETACHMENT SURGERY     RETINAL DETACHMENT SURGERY  07/03/11   right   TONSILLECTOMY      Current Outpatient Medications  Medication Sig Dispense Refill   aspirin EC 81 MG tablet Take 81 mg by mouth daily.     Calcium-Vitamin D-Vitamin K 244-010-27 MG-UNT-MCG CHEW Chew 1 each by mouth daily.      Cyanocobalamin (VITAMIN B-12 PO) Take by mouth daily.      dapagliflozin propanediol (FARXIGA) 10 MG TABS tablet Take 10 mg by mouth daily.     escitalopram (LEXAPRO) 10 MG tablet Take 10 mg by mouth daily.     JANUMET 50-1000 MG tablet Take 1 tablet by mouth 2 (two) times daily with a meal.      Multiple Vitamins-Minerals (CENTRUM SILVER ULTRA WOMENS PO) Take 1 tablet by mouth daily.     nitroGLYCERIN (NITROSTAT) 0.4 MG SL tablet Place 1 tablet (0.4 mg total) under the tongue every 5 (five) minutes as needed for chest pain. 25 tablet 3   Omega-3 Fatty Acids (FISH OIL PO) Take by mouth daily at 6 (six) AM.     omeprazole (PRILOSEC) 20 MG capsule Take 20 mg by mouth daily.     ramipril (ALTACE) 10 MG capsule Take 10 mg by mouth daily.     simvastatin (ZOCOR) 40 MG tablet Take 40 mg by mouth daily.     vitamin C (ASCORBIC ACID) 250 MG tablet Take 250 mg by mouth daily.     zinc gluconate 50 MG tablet Take 50 mg by mouth daily.     No current facility-administered medications for this visit.    Allergies as of 06/05/2021 - Review Complete 06/05/2021  Allergen Reaction Noted   Clopidogrel bisulfate Hives 12/03/2009   Oxycontin [  oxycodone hcl] Nausea And Vomiting 07/17/2017    Family History  Problem Relation Age of Onset   Heart disease Mother    Ovarian cancer Mother    Stroke Father    Diabetes Sister    Diabetes Brother    Diabetes Sister    Heart disease Brother    Cancer Brother        spine   Anesthesia problems Neg Hx     Social History   Socioeconomic History   Marital status: Divorced    Spouse name: Not on file   Number of children: Not on file   Years of education: Not on file   Highest education level: Not on file  Occupational History   Not on file  Tobacco Use   Smoking status: Never   Smokeless tobacco: Never  Vaping Use   Vaping Use: Never used  Substance and Sexual Activity   Alcohol use: No   Drug use: No   Sexual activity: Not on file    Comment: single-2 grown children  Other Topics Concern   Not on  file  Social History Narrative   Not on file   Social Determinants of Health   Financial Resource Strain: Not on file  Food Insecurity: Not on file  Transportation Needs: Not on file  Physical Activity: Not on file  Stress: Not on file  Social Connections: Not on file  Intimate Partner Violence: Not on file    Subjective: Review of Systems  Constitutional:  Negative for chills and fever.  HENT:  Negative for congestion and hearing loss.   Eyes:  Negative for blurred vision and double vision.  Respiratory:  Negative for cough and shortness of breath.   Cardiovascular:  Negative for chest pain and palpitations.  Gastrointestinal:  Positive for heartburn. Negative for abdominal pain, blood in stool, constipation, diarrhea, melena and vomiting.  Genitourinary:  Negative for dysuria and urgency.  Musculoskeletal:  Negative for joint pain and myalgias.  Skin:  Negative for itching and rash.  Neurological:  Negative for dizziness and headaches.  Psychiatric/Behavioral:  Negative for depression. The patient is not nervous/anxious.       Objective: BP (!) 163/84    Pulse 75    Temp (!) 97.1 F (36.2 C) (Temporal)    Ht 5\' 5"  (1.651 m)    Wt 176 lb 6.4 oz (80 kg)    BMI 29.35 kg/m  Physical Exam Constitutional:      Appearance: Normal appearance.  HENT:     Head: Normocephalic and atraumatic.  Eyes:     Extraocular Movements: Extraocular movements intact.     Conjunctiva/sclera: Conjunctivae normal.  Cardiovascular:     Rate and Rhythm: Normal rate and regular rhythm.  Pulmonary:     Effort: Pulmonary effort is normal.     Breath sounds: Normal breath sounds.  Abdominal:     General: Bowel sounds are normal.     Palpations: Abdomen is soft.  Musculoskeletal:        General: No swelling. Normal range of motion.     Cervical back: Normal range of motion and neck supple.  Skin:    General: Skin is warm and dry.     Coloration: Skin is not jaundiced.  Neurological:      General: No focal deficit present.     Mental Status: She is alert and oriented to person, place, and time.  Psychiatric:        Mood and Affect: Mood normal.  Behavior: Behavior normal.     Assessment: *GERD-well-controlled on omeprazole 20 mg daily *Colon cancer screening  Plan: GERD well-controlled on omeprazole.  We will continue.  No alarm symptoms today to warrant further investigation with upper endoscopy.  Will schedule for screening colonoscopy.The risks including infection, bleed, or perforation as well as benefits, limitations, alternatives and imponderables have been reviewed with the patient. Questions have been answered. All parties agreeable.  Given history of cardiac stent placement, ASA 3.  No antiplatelet medication currently besides 81 mg aspirin.  Thank you Dr. Hilma Favors for the kind referral.  06/05/2021 4:32 PM   Disclaimer: This note was dictated with voice recognition software. Similar sounding words can inadvertently be transcribed and may not be corrected upon review.

## 2021-06-05 NOTE — Patient Instructions (Signed)
We will schedule you for colonoscopy today for colon cancer screening purposes.  Continue on omeprazole daily for your chronic reflux.  It was very nice meeting you today.  Dr. Abbey Chatters  At [facility] we value your feedback. You may receive a survey about your visit today. Please share your experience as we strive to create trusting relationships with our patients to provide genuine, compassionate, quality care.  We appreciate your understanding and patience as we review any laboratory studies, imaging, and other diagnostic tests that are ordered as we care for you. Our office policy is 5 business days for review of these results, and any emergent or urgent results are addressed in a timely manner for your best interest. If you do not hear from our office in 1 week, please contact us.   We also encourage the use of MyChart, which contains your medical information for your review as well. If you are not enrolled in this feature, an access code is on this after visit summary for your convenience. Thank you for allowing [place] to be involved in your care.  It was great to see you today!  I hope you have a great rest of your Winter!    Elon Alas. Abbey Chatters, D.O. Gastroenterology and Hepatology [facility]

## 2021-06-06 ENCOUNTER — Telehealth: Payer: Self-pay | Admitting: *Deleted

## 2021-06-06 NOTE — Telephone Encounter (Signed)
Called pt, VM full. Needs TCS with Dr. Abbey Chatters, ASA 3

## 2021-06-07 ENCOUNTER — Telehealth: Payer: Self-pay | Admitting: *Deleted

## 2021-06-07 ENCOUNTER — Encounter: Payer: Self-pay | Admitting: *Deleted

## 2021-06-07 DIAGNOSIS — M9905 Segmental and somatic dysfunction of pelvic region: Secondary | ICD-10-CM | POA: Diagnosis not present

## 2021-06-07 DIAGNOSIS — M9902 Segmental and somatic dysfunction of thoracic region: Secondary | ICD-10-CM | POA: Diagnosis not present

## 2021-06-07 DIAGNOSIS — M9903 Segmental and somatic dysfunction of lumbar region: Secondary | ICD-10-CM | POA: Diagnosis not present

## 2021-06-07 DIAGNOSIS — M62830 Muscle spasm of back: Secondary | ICD-10-CM | POA: Diagnosis not present

## 2021-06-07 MED ORDER — PEG 3350-KCL-NA BICARB-NACL 420 G PO SOLR: ORAL | 0 refills | Status: DC

## 2021-06-07 NOTE — Telephone Encounter (Signed)
Called pt, she has been scheduled for TCS with Dr. Abbey Chatters asa 3 ON 1/13 AT 8:45AM. Aware will mail prep instructions with pre-op appt. Will send rx to pharmacy. Insurance will remain same next year.

## 2021-06-21 DIAGNOSIS — M9905 Segmental and somatic dysfunction of pelvic region: Secondary | ICD-10-CM | POA: Diagnosis not present

## 2021-06-21 DIAGNOSIS — M9903 Segmental and somatic dysfunction of lumbar region: Secondary | ICD-10-CM | POA: Diagnosis not present

## 2021-06-21 DIAGNOSIS — M9902 Segmental and somatic dysfunction of thoracic region: Secondary | ICD-10-CM | POA: Diagnosis not present

## 2021-06-21 DIAGNOSIS — M62830 Muscle spasm of back: Secondary | ICD-10-CM | POA: Diagnosis not present

## 2021-06-27 NOTE — Patient Instructions (Signed)
CORENE RESNICK  06/27/2021     @PREFPERIOPPHARMACY @   Your procedure is scheduled on  07/05/2021.   Report to [facility] at  0700  A.M.   Call this number if you have problems the morning of surgery:  [phone]   Remember:  Follow the diet and prep instructions given to you by the office.    DO NOT take any medications for diabetes the morning of your procedure.    Take these medicines the morning of surgery with A SIP OF WATER                                    lexapro, prilosec.     Do not wear jewelry, make-up or nail polish.  Do not wear lotions, powders, or perfumes, or deodorant.  Do not shave 48 hours prior to surgery.  Men may shave face and neck.  Do not bring valuables to the hospital.  [facility] is not responsible for any belongings or valuables.  Contacts, dentures or bridgework may not be worn into surgery.  Leave your suitcase in the car.  After surgery it may be brought to your room.  For patients admitted to the hospital, discharge time will be determined by your treatment team.  Patients discharged the day of surgery will not be allowed to drive home and must have someone with them for 24 hours.    Special instructions:   DO NOT smoke tobacco or vape for 24 hours before your procedure.  Please read over the following fact sheets that you were given. Anesthesia Post-op Instructions and Care and Recovery After Surgery      Colonoscopy, Adult, Care After This sheet gives you information about how to care for yourself after your procedure. Your health care provider may also give you more specific instructions. If you have problems or questions, contact your health care provider. What can I expect after the procedure? After the procedure, it is common to have: A small amount of blood in your stool for 24 hours after the procedure. Some gas. Mild cramping or bloating of your abdomen. Follow these instructions at home: Eating and  drinking  Drink enough fluid to keep your urine pale yellow. Follow instructions from your health care provider about eating or drinking restrictions. Resume your normal diet as instructed by your health care provider. Avoid heavy or fried foods that are hard to digest. Activity Rest as told by your health care provider. Avoid sitting for a long time without moving. Get up to take short walks every 1-2 hours. This is important to improve blood flow and breathing. Ask for help if you feel weak or unsteady. Return to your normal activities as told by your health care provider. Ask your health care provider what activities are safe for you. Managing cramping and bloating  Try walking around when you have cramps or feel bloated. Apply heat to your abdomen as told by your health care provider. Use the heat source that your health care provider recommends, such as a moist heat pack or a heating pad. Place a towel between your skin and the heat source. Leave the heat on for 20-30 minutes. Remove the heat if your skin turns bright red. This is especially important if you are unable to feel pain, heat, or cold. You may have a greater risk of getting burned. General instructions If  you were given a sedative during the procedure, it can affect you for several hours. Do not drive or operate machinery until your health care provider says that it is safe. For the first 24 hours after the procedure: Do not sign important documents. Do not drink alcohol. Do your regular daily activities at a slower pace than normal. Eat soft foods that are easy to digest. Take over-the-counter and prescription medicines only as told by your health care provider. Keep all follow-up visits as told by your health care provider. This is important. Contact a health care provider if: You have blood in your stool 2-3 days after the procedure. Get help right away if you have: More than a small spotting of blood in your  stool. Large blood clots in your stool. Swelling of your abdomen. Nausea or vomiting. A fever. Increasing pain in your abdomen that is not relieved with medicine. Summary After the procedure, it is common to have a small amount of blood in your stool. You may also have mild cramping and bloating of your abdomen. If you were given a sedative during the procedure, it can affect you for several hours. Do not drive or operate machinery until your health care provider says that it is safe. Get help right away if you have a lot of blood in your stool, nausea or vomiting, a fever, or increased pain in your abdomen. This information is not intended to replace advice given to you by your health care provider. Make sure you discuss any questions you have with your health care provider. Document Revised: 04/15/2019 Document Reviewed: 01/03/2019 Elsevier Patient Education  [place] After This sheet gives you information about how to care for yourself after your procedure. Your health care provider may also give you more specific instructions. If you have problems or questions, contact your health care provider. What can I expect after the procedure? After the procedure, it is common to have: Tiredness. Forgetfulness about what happened after the procedure. Impaired judgment for important decisions. Nausea or vomiting. Some difficulty with balance. Follow these instructions at home: For the time period you were told by your health care provider:   Rest as needed. Do not participate in activities where you could fall or become injured. Do not drive or use machinery. Do not drink alcohol. Do not take sleeping pills or medicines that cause drowsiness. Do not make important decisions or sign legal documents. Do not take care of children on your own. Eating and drinking Follow the diet that is recommended by your health care provider. Drink enough fluid to  keep your urine pale yellow. If you vomit: Drink water, juice, or soup when you can drink without vomiting. Make sure you have little or no nausea before eating solid foods. General instructions Have a responsible adult stay with you for the time you are told. It is important to have someone help care for you until you are awake and alert. Take over-the-counter and prescription medicines only as told by your health care provider. If you have sleep apnea, surgery and certain medicines can increase your risk for breathing problems. Follow instructions from your health care provider about wearing your sleep device: Anytime you are sleeping, including during daytime naps. While taking prescription pain medicines, sleeping medicines, or medicines that make you drowsy. Avoid smoking. Keep all follow-up visits as told by your health care provider. This is important. Contact a health care provider if: You keep feeling nauseous or  you keep vomiting. You feel light-headed. You are still sleepy or having trouble with balance after 24 hours. You develop a rash. You have a fever. You have redness or swelling around the IV site. Get help right away if: You have trouble breathing. You have new-onset confusion at home. Summary For several hours after your procedure, you may feel tired. You may also be forgetful and have poor judgment. Have a responsible adult stay with you for the time you are told. It is important to have someone help care for you until you are awake and alert. Rest as told. Do not drive or operate machinery. Do not drink alcohol or take sleeping pills. Get help right away if you have trouble breathing, or if you suddenly become confused. This information is not intended to replace advice given to you by your health care provider. Make sure you discuss any questions you have with your health care provider. Document Revised: 02/23/2020 Document Reviewed: 05/12/2019 Elsevier Patient  Education  2022 [employer].

## 2021-06-28 DIAGNOSIS — M9903 Segmental and somatic dysfunction of lumbar region: Secondary | ICD-10-CM | POA: Diagnosis not present

## 2021-06-28 DIAGNOSIS — M62830 Muscle spasm of back: Secondary | ICD-10-CM | POA: Diagnosis not present

## 2021-06-28 DIAGNOSIS — M9902 Segmental and somatic dysfunction of thoracic region: Secondary | ICD-10-CM | POA: Diagnosis not present

## 2021-06-28 DIAGNOSIS — M9905 Segmental and somatic dysfunction of pelvic region: Secondary | ICD-10-CM | POA: Diagnosis not present

## 2021-07-01 ENCOUNTER — Encounter (HOSPITAL_COMMUNITY)
Admission: RE | Admit: 2021-07-01 | Discharge: 2021-07-01 | Disposition: A | Discharge status: Home / Self Care | Payer: Medicare HMO | Source: Ambulatory Visit | Attending: Internal Medicine | Admitting: Internal Medicine

## 2021-07-01 ENCOUNTER — Other Ambulatory Visit: Payer: Self-pay

## 2021-07-01 ENCOUNTER — Encounter (HOSPITAL_COMMUNITY): Payer: Self-pay

## 2021-07-01 VITALS — BP 146/81 | HR 98 | Temp 97.8°F | Resp 18 | Ht 65.0 in | Wt 175.0 lb

## 2021-07-01 DIAGNOSIS — Z01812 Encounter for preprocedural laboratory examination: Secondary | ICD-10-CM | POA: Diagnosis not present

## 2021-07-01 DIAGNOSIS — E119 Type 2 diabetes mellitus without complications: Secondary | ICD-10-CM | POA: Insufficient documentation

## 2021-07-01 LAB — BASIC METABOLIC PANEL
Anion gap: 12 (ref 5–15)
BUN: 18 mg/dL (ref 8–23)
CO2: 20 mmol/L — ABNORMAL LOW (ref 22–32)
Calcium: 9.4 mg/dL (ref 8.9–10.3)
Chloride: 104 mmol/L (ref 98–111)
Creatinine, Ser: 0.6 mg/dL (ref 0.44–1.00)
GFR, Estimated: >60 mL/min (ref >60)
Glucose, Bld: 223 mg/dL — ABNORMAL HIGH (ref 70–99)
Potassium: 3.9 mmol/L (ref 3.5–5.1)
Sodium: 136 mmol/L (ref 135–145)

## 2021-07-05 ENCOUNTER — Other Ambulatory Visit: Payer: Self-pay

## 2021-07-05 ENCOUNTER — Encounter (HOSPITAL_COMMUNITY): Payer: Self-pay

## 2021-07-05 ENCOUNTER — Ambulatory Visit (HOSPITAL_COMMUNITY): Payer: Medicare HMO | Admitting: Anesthesiology

## 2021-07-05 ENCOUNTER — Ambulatory Visit (HOSPITAL_COMMUNITY)
Admission: RE | Admit: 2021-07-05 | Discharge: 2021-07-05 | Disposition: A | Discharge status: Home / Self Care | Payer: Medicare HMO | Source: Ambulatory Visit | Attending: Internal Medicine | Admitting: Internal Medicine

## 2021-07-05 ENCOUNTER — Encounter (HOSPITAL_COMMUNITY)
Admission: RE | Disposition: A | Discharge status: Home / Self Care | Payer: Self-pay | Source: Ambulatory Visit | Attending: Internal Medicine

## 2021-07-05 DIAGNOSIS — I2510 Atherosclerotic heart disease of native coronary artery without angina pectoris: Secondary | ICD-10-CM | POA: Diagnosis not present

## 2021-07-05 DIAGNOSIS — Z7982 Long term (current) use of aspirin: Secondary | ICD-10-CM | POA: Insufficient documentation

## 2021-07-05 DIAGNOSIS — Z79899 Other long term (current) drug therapy: Secondary | ICD-10-CM | POA: Diagnosis not present

## 2021-07-05 DIAGNOSIS — Z955 Presence of coronary angioplasty implant and graft: Secondary | ICD-10-CM | POA: Diagnosis not present

## 2021-07-05 DIAGNOSIS — K635 Polyp of colon: Secondary | ICD-10-CM | POA: Diagnosis not present

## 2021-07-05 DIAGNOSIS — K648 Other hemorrhoids: Secondary | ICD-10-CM | POA: Diagnosis not present

## 2021-07-05 DIAGNOSIS — K219 Gastro-esophageal reflux disease without esophagitis: Secondary | ICD-10-CM | POA: Insufficient documentation

## 2021-07-05 DIAGNOSIS — Z1211 Encounter for screening for malignant neoplasm of colon: Secondary | ICD-10-CM | POA: Diagnosis not present

## 2021-07-05 DIAGNOSIS — D122 Benign neoplasm of ascending colon: Secondary | ICD-10-CM | POA: Diagnosis not present

## 2021-07-05 DIAGNOSIS — K5730 Diverticulosis of large intestine without perforation or abscess without bleeding: Secondary | ICD-10-CM | POA: Diagnosis not present

## 2021-07-05 DIAGNOSIS — I10 Essential (primary) hypertension: Secondary | ICD-10-CM | POA: Diagnosis not present

## 2021-07-05 DIAGNOSIS — Z139 Encounter for screening, unspecified: Secondary | ICD-10-CM | POA: Diagnosis not present

## 2021-07-05 HISTORY — PX: POLYPECTOMY: SHX5525

## 2021-07-05 HISTORY — PX: COLONOSCOPY WITH PROPOFOL: SHX5780

## 2021-07-05 LAB — GLUCOSE, CAPILLARY: Glucose-Capillary: 169 mg/dL — ABNORMAL HIGH (ref 70–99)

## 2021-07-05 SURGERY — COLONOSCOPY WITH PROPOFOL
Anesthesia: General

## 2021-07-05 MED ORDER — PROPOFOL 10 MG/ML IV BOLUS
INTRAVENOUS | Status: DC | PRN
  Administered 2021-07-05: 60 mg via INTRAVENOUS
  Administered 2021-07-05 (×2): 20 mg via INTRAVENOUS

## 2021-07-05 MED ORDER — LACTATED RINGERS IV SOLN: INTRAVENOUS | Status: DC | PRN

## 2021-07-05 MED ORDER — LIDOCAINE HCL (CARDIAC) PF 100 MG/5ML IV SOSY
PREFILLED_SYRINGE | INTRAVENOUS | Status: DC | PRN
  Administered 2021-07-05: 50 mg via INTRAVENOUS

## 2021-07-05 MED ORDER — PROPOFOL 500 MG/50ML IV EMUL
INTRAVENOUS | Status: DC | PRN
  Administered 2021-07-05: 150 ug/kg/min via INTRAVENOUS

## 2021-07-05 NOTE — Interval H&P Note (Signed)
History and Physical Interval Note:  07/05/2021 8:35 AM  Erika Ramos  has presented today for surgery, with the diagnosis of screening.  The various methods of treatment have been discussed with the patient and family. After consideration of risks, benefits and other options for treatment, the patient has consented to  Procedure(s) with comments: COLONOSCOPY WITH PROPOFOL (N/A) - 8:45am as a surgical intervention.  The patient's history has been reviewed, patient examined, no change in status, stable for surgery.  I have reviewed the patient's chart and labs.  Questions were answered to the patient's satisfaction.     Eloise Harman

## 2021-07-05 NOTE — Anesthesia Postprocedure Evaluation (Signed)
Anesthesia Post Note  Patient: Erika Ramos  Procedure(s) Performed: COLONOSCOPY WITH PROPOFOL POLYPECTOMY  Patient location during evaluation: Phase II Anesthesia Type: General Level of consciousness: awake Pain management: pain level controlled Vital Signs Assessment: post-procedure vital signs reviewed and stable Respiratory status: spontaneous breathing and respiratory function stable Cardiovascular status: blood pressure returned to baseline and stable Postop Assessment: no headache and no apparent nausea or vomiting Anesthetic complications: no Comments: Late entry   No notable events documented.   Last Vitals:  Vitals:   07/05/21 0749 07/05/21 0906  BP: (!) 164/89 117/65  Pulse: 87 80  Resp: 14 17  Temp: 36.9 C 37 C  SpO2: 96% 95%    Last Pain:  Vitals:   07/05/21 0906  TempSrc: Oral  PainSc: 0-No pain                 Louann Sjogren

## 2021-07-05 NOTE — Op Note (Signed)
[facility] Patient Name: Erika Ramos Procedure Date: 07/05/2021 8:33 AM MRN: [id number] Date of Birth: [birth year] Attending MD: Elon Alas. Edgar Frisk CSN: [id number] Age: 76 Admit Type: Outpatient Procedure:                Colonoscopy Indications:              Screening for colorectal malignant neoplasm Providers:                Elon Alas. Abbey Chatters, DO, Janeece Riggers, RN, Kristine L.                            Risa Grill, Technician Referring MD:              Medicines:                See the Anesthesia note for documentation of the                            administered medications Complications:            No immediate complications. Estimated Blood Loss:     Estimated blood loss was minimal. Procedure:                Pre-Anesthesia Assessment:                           - The anesthesia plan was to use monitored                            anesthesia care (MAC).                           After obtaining informed consent, the colonoscope                            was passed under direct vision. Throughout the                            procedure, the patient's blood pressure, pulse, and                            oxygen saturations were monitored continuously. The                            PCF-HQ190L ([id number]) scope was introduced through                            the anus and advanced to the the cecum, identified                            by appendiceal orifice and ileocecal valve. The                            colonoscopy was performed without difficulty. The                            patient tolerated the procedure  well. The quality                            of the bowel preparation was evaluated using the                            BBPS [facility] Bowel Preparation Scale) with scores                            of: Right Colon = 3, Transverse Colon = 3 and Left                            Colon = 3 (entire mucosa seen well with no residual                             staining, small fragments of stool or opaque                            liquid). The total BBPS score equals 9. Scope In: 8:45:45 AM Scope Out: 9:01:48 AM Scope Withdrawal Time: 0 hours 12 minutes 30 seconds  Total Procedure Duration: 0 hours 16 minutes 3 seconds  Findings:      The perianal and digital rectal examinations were normal.      Non-bleeding internal hemorrhoids were found during endoscopy.      Multiple small-mouthed diverticula were found in the sigmoid colon and       descending colon.      A 4 mm polyp was found in the ascending colon. The polyp was sessile.       The polyp was removed with a cold snare. Resection and retrieval were       complete.      The exam was otherwise without abnormality. Impression:               - Non-bleeding internal hemorrhoids.                           - Diverticulosis in the sigmoid colon and in the                            descending colon.                           - One 4 mm polyp in the ascending colon, removed                            with a cold snare. Resected and retrieved.                           - The examination was otherwise normal. Moderate Sedation:      Per Anesthesia Care Recommendation:           - Patient has a contact number available for                            emergencies. The signs and symptoms of potential  delayed complications were discussed with the                            patient. Return to normal activities tomorrow.                            Written discharge instructions were provided to the                            patient.                           - Resume previous diet.                           - Continue present medications.                           - Await pathology results.                           - Repeat colonoscopy in 5 years for surveillance.                           - Return to GI clinic PRN. Procedure Code(s):        --- Professional ---                            [phone], Colonoscopy, flexible; with removal of                            tumor(s), polyp(s), or other lesion(s) by snare                            technique Diagnosis Code(s):        --- Professional ---                           Z12.11, Encounter for screening for malignant                            neoplasm of colon                           K64.8, Other hemorrhoids                           K63.5, Polyp of colon                           K57.30, Diverticulosis of large intestine without                            perforation or abscess without bleeding CPT copyright 2019 American Medical Association. All rights reserved. The codes documented in this report are preliminary and upon coder review may  be revised to meet current compliance requirements. Elon Alas. Abbey Chatters, DO [place], DO 07/05/2021  9:05:30 AM This report has been signed electronically. Number of Addenda: 0

## 2021-07-05 NOTE — Anesthesia Preprocedure Evaluation (Signed)
Anesthesia Evaluation  Patient identified by MRN, date of birth, ID band Patient awake    Reviewed: Allergy & Precautions, H&P , NPO status , Patient's Chart, lab work & pertinent test results, reviewed documented beta blocker date and time   History of Anesthesia Complications (+) PONV and history of anesthetic complications  Airway Mallampati: II  TM Distance: >3 FB Neck ROM: full    Dental no notable dental hx.    Pulmonary shortness of breath,    Pulmonary exam normal breath sounds clear to auscultation       Cardiovascular Exercise Tolerance: Good hypertension, + CAD and + Cardiac Stents   Rhythm:regular Rate:Normal     Neuro/Psych negative neurological ROS  negative psych ROS   GI/Hepatic Neg liver ROS, GERD  Medicated,  Endo/Other  negative endocrine ROSdiabetes  Renal/GU negative Renal ROS  negative genitourinary   Musculoskeletal   Abdominal   Peds  Hematology negative hematology ROS (+)   Anesthesia Other Findings   Reproductive/Obstetrics negative OB ROS                             Anesthesia Physical Anesthesia Plan  ASA: 3  Anesthesia Plan: General   Post-op Pain Management:    Induction:   PONV Risk Score and Plan: Propofol infusion  Airway Management Planned:   Additional Equipment:   Intra-op Plan:   Post-operative Plan:   Informed Consent: I have reviewed the patients History and Physical, chart, labs and discussed the procedure including the risks, benefits and alternatives for the proposed anesthesia with the patient or authorized representative who has indicated his/her understanding and acceptance.     Dental Advisory Given  Plan Discussed with: CRNA  Anesthesia Plan Comments:         Anesthesia Quick Evaluation

## 2021-07-05 NOTE — Transfer of Care (Signed)
Immediate Anesthesia Transfer of Care Note  Patient: Erika Ramos  Procedure(s) Performed: COLONOSCOPY WITH PROPOFOL POLYPECTOMY  Patient Location: Short Stay  Anesthesia Type:General  Level of Consciousness: drowsy  Airway & Oxygen Therapy: Patient Spontanous Breathing  Post-op Assessment: Report given to RN and Post -op Vital signs reviewed and stable  Post vital signs: Reviewed and stable  Last Vitals:  Vitals Value Taken Time  BP    Temp    Pulse    Resp    SpO2      Last Pain:  Vitals:   07/05/21 0739  PainSc: 0-No pain         Complications: No notable events documented.

## 2021-07-05 NOTE — Discharge Instructions (Addendum)
°  Colonoscopy Discharge Instructions  Read the instructions outlined below and refer to this sheet in the next few weeks. These discharge instructions provide you with general information on caring for yourself after you leave the hospital. Your doctor may also give you specific instructions. While your treatment has been planned according to the most current medical practices available, unavoidable complications occasionally occur.   ACTIVITY You may resume your regular activity, but move at a slower pace for the next 24 hours.  Take frequent rest periods for the next 24 hours.  Walking will help get rid of the air and reduce the bloated feeling in your belly (abdomen).  No driving for 24 hours (because of the medicine (anesthesia) used during the test).   Do not sign any important legal documents or operate any machinery for 24 hours (because of the anesthesia used during the test).  NUTRITION Drink plenty of fluids.  You may resume your normal diet as instructed by your doctor.  Begin with a light meal and progress to your normal diet. Heavy or fried foods are harder to digest and may make you feel sick to your stomach (nauseated).  Avoid alcoholic beverages for 24 hours or as instructed.  MEDICATIONS You may resume your normal medications unless your doctor tells you otherwise.  WHAT YOU CAN EXPECT TODAY Some feelings of bloating in the abdomen.  Passage of more gas than usual.  Spotting of blood in your stool or on the toilet paper.  IF YOU HAD POLYPS REMOVED DURING THE COLONOSCOPY: No aspirin products for 7 days or as instructed.  No alcohol for 7 days or as instructed.  Eat a soft diet for the next 24 hours.  FINDING OUT THE RESULTS OF YOUR TEST Not all test results are available during your visit. If your test results are not back during the visit, make an appointment with your caregiver to find out the results. Do not assume everything is normal if you have not heard from your  caregiver or the medical facility. It is important for you to follow up on all of your test results.  SEEK IMMEDIATE MEDICAL ATTENTION IF: You have more than a spotting of blood in your stool.  Your belly is swollen (abdominal distention).  You are nauseated or vomiting.  You have a temperature over 101.  You have abdominal pain or discomfort that is severe or gets worse throughout the day.   Your colonoscopy revealed 1 polyp(s) which I removed successfully. Await pathology results, my office will contact you. I recommend repeating colonoscopy in 5 years for surveillance purposes.   You also have diverticulosis and internal hemorrhoids. I would recommend increasing fiber in your diet or adding OTC Benefiber/Metamucil. Be sure to drink at least 4 to 6 glasses of water daily. Follow-up with GI as needed.   I hope you have a great rest of your week!  Charles K. Carver, D.O. Gastroenterology and Hepatology [facility]  

## 2021-07-08 ENCOUNTER — Encounter (HOSPITAL_COMMUNITY): Payer: Self-pay | Admitting: Internal Medicine

## 2021-07-08 LAB — SURGICAL PATHOLOGY

## 2021-07-15 ENCOUNTER — Telehealth: Payer: Self-pay | Admitting: Internal Medicine

## 2021-07-15 NOTE — Telephone Encounter (Signed)
Pt is requesting her pathology report from colonoscopy. Please send so I can advise

## 2021-07-15 NOTE — Telephone Encounter (Signed)
PATIENT CALLED ASKING ABOUT HER BIOPSY RESULTS FROM HER POLYP THAT WAS REMOVED

## 2021-08-19 ENCOUNTER — Other Ambulatory Visit: Payer: Self-pay

## 2021-08-19 ENCOUNTER — Ambulatory Visit (INDEPENDENT_AMBULATORY_CARE_PROVIDER_SITE_OTHER): Payer: Medicare HMO | Admitting: Urology

## 2021-08-19 ENCOUNTER — Encounter: Payer: Self-pay | Admitting: Urology

## 2021-08-19 VITALS — BP 148/82 | HR 96 | Wt 175.0 lb

## 2021-08-19 DIAGNOSIS — R350 Frequency of micturition: Secondary | ICD-10-CM

## 2021-08-19 DIAGNOSIS — R310 Gross hematuria: Secondary | ICD-10-CM

## 2021-08-19 LAB — URINALYSIS, ROUTINE W REFLEX MICROSCOPIC
Bilirubin, UA: NEGATIVE
Ketones, UA: NEGATIVE
Leukocytes,UA: NEGATIVE
Nitrite, UA: NEGATIVE
Protein,UA: NEGATIVE
RBC, UA: NEGATIVE
Specific Gravity, UA: 1.01 (ref 1.005–1.030)
Urobilinogen, Ur: 0.2 mg/dL (ref 0.2–1.0)
pH, UA: 6.5 (ref 5.0–7.5)

## 2021-08-19 LAB — BLADDER SCAN AMB NON-IMAGING: Scan Result: 0

## 2021-08-19 MED ORDER — MIRABEGRON ER 25 MG PO TB24: 25.0000 mg | ORAL_TABLET | Freq: Every day | ORAL | 0 refills | Status: DC

## 2021-08-19 NOTE — Progress Notes (Signed)
bla

## 2021-08-19 NOTE — Progress Notes (Signed)
08/19/2021 2:40 PM   Erika Ramos 27-Mar-1946 [id number]  Referring provider: Sharilyn Sites, MD [address] [place],  [place] [address]  Followup nocturia and gross hematuria   HPI: Erika Ramos is a 76yo here for followup for Nocturia and gross hematuria. She has stable nocturia on mirabegron 25mg . Nocturia 2-3x.  No straining to urinate. No dysuria. No UTIs since last visit. No gross hematuria.    PMH: Past Medical History:  Diagnosis Date   Breast cancer ([place])    Breast cancer, stage 2, right ([place]) 09/06/2009   Qualifier: Diagnosis of  By: Orville Govern, CMA, Carol     Coronary artery disease    Diabetes mellitus without complication ([place])    Fatty liver disease, nonalcoholic    GERD (gastroesophageal reflux disease)    Hypertension    PONV (postoperative nausea and vomiting)    Nausea/ Vomitting- last time 2010   Stented coronary artery     Surgical History: Past Surgical History:  Procedure Laterality Date   ABDOMINAL HYSTERECTOMY     BREAST SURGERY  March 2010   cataract  Right 09/2012   CATARACT EXTRACTION  01/01/2018   left eye   COLONOSCOPY WITH PROPOFOL N/A 07/05/2021   Procedure: COLONOSCOPY WITH PROPOFOL;  Surgeon: Eloise Harman, DO;  [facility];  Service: Endoscopy;  Laterality: N/A;  8:45am   FRACTURE SURGERY     wrist  right   PARS PLANA VITRECTOMY  07/03/2011   Procedure: PARS PLANA VITRECTOMY WITH 25 GAUGE;  Surgeon: Hayden Pedro, MD;  Location: [facility];  Service: Ophthalmology;  Laterality: Right;  MEMBRANE PEEL, HEAD SCOPE LASER, GAS   POLYPECTOMY  07/05/2021   Procedure: POLYPECTOMY;  Surgeon: Eloise Harman, DO;  [facility];  Service: Endoscopy;;   RETINAL DETACHMENT SURGERY     RETINAL DETACHMENT SURGERY  07/03/11   right   TONSILLECTOMY      Home Medications:  Allergies as of 08/19/2021       Reactions   Clopidogrel Bisulfate Hives   Oxycontin [oxycodone Hcl] Nausea And Vomiting        Medication List         Accurate as of August 19, 2021  2:40 PM. If you have any questions, ask your nurse or doctor.          aspirin EC 81 MG tablet Take 81 mg by mouth daily.   Calcium-Vitamin D-Vitamin K 536-144-31 MG-UNT-MCG Chew Chew 1 each by mouth daily.   CENTRUM SILVER ULTRA WOMENS PO Take 2 tablets by mouth daily.   dapagliflozin propanediol 10 MG Tabs tablet Commonly known as: FARXIGA Take 10 mg by mouth daily.   escitalopram 10 MG tablet Commonly known as: LEXAPRO Take 10 mg by mouth daily.   FISH OIL PO Take 1 capsule by mouth daily at 6 (six) AM.   Janumet 50-1000 MG tablet Generic drug: sitaGLIPtin-metformin Take 1 tablet by mouth 2 (two) times daily with a meal.   mirabegron ER 25 MG Tb24 tablet Commonly known as: MYRBETRIQ Take 1 tablet (25 mg total) by mouth at bedtime. Started by: Erika Bang, MD   nitroGLYCERIN 0.4 MG SL tablet Commonly known as: NITROSTAT Place 1 tablet (0.4 mg total) under the tongue every 5 (five) minutes as needed for chest pain.   omeprazole 20 MG capsule Commonly known as: PRILOSEC Take 20 mg by mouth daily.   ramipril 10 MG capsule Commonly known as: ALTACE Take 10 mg by mouth daily.  simvastatin 40 MG tablet Commonly known as: ZOCOR Take 40 mg by mouth daily.   VITAMIN B-12 PO Take 1 tablet by mouth daily.   vitamin C 250 MG tablet Commonly known as: ASCORBIC ACID Take 500 mg by mouth daily.   zinc gluconate 50 MG tablet Take 50 mg by mouth 3 (three) times a week.        Allergies:  Allergies  Allergen Reactions   Clopidogrel Bisulfate Hives   Oxycontin [Oxycodone Hcl] Nausea And Vomiting    Family History: Family History  Problem Relation Age of Onset   Heart disease Mother    Ovarian cancer Mother    Stroke Father    Diabetes Sister    Diabetes Brother    Diabetes Sister    Heart disease Brother    Cancer Brother        spine   Anesthesia problems Neg Hx     Social History:  reports that  she has never smoked. She has never used smokeless tobacco. She reports that she does not drink alcohol and does not use drugs.  ROS: All other review of systems were reviewed and are negative except what is noted above in HPI  Physical Exam: BP (!) 148/82    Pulse 96    Wt 175 lb (79.4 kg)    BMI 29.12 kg/m   Constitutional:  Alert and oriented, No acute distress. HEENT: [place] AT, moist mucus membranes.  Trachea midline, no masses. Cardiovascular: No clubbing, cyanosis, or edema. Respiratory: Normal respiratory effort, no increased work of breathing. GI: Abdomen is soft, nontender, nondistended, no abdominal masses GU: No CVA tenderness.  Lymph: No cervical or inguinal lymphadenopathy. Skin: No rashes, bruises or suspicious lesions. Neurologic: Grossly intact, no focal deficits, moving all 4 extremities. Psychiatric: Normal mood and affect.  Laboratory Data: Lab Results  Component Value Date   WBC 11.0 (H) 07/17/2017   HGB 15.1 (H) 07/17/2017   HCT 47.1 (H) 07/17/2017   MCV 86.4 07/17/2017   PLT 214 07/17/2017    Lab Results  Component Value Date   CREATININE 0.60 07/01/2021    No results found for: PSA  No results found for: TESTOSTERONE  No results found for: HGBA1C  Urinalysis    Component Value Date/Time   APPEARANCEUR Clear 02/15/2021 1304   GLUCOSEU 3+ (A) 02/15/2021 1304   BILIRUBINUR Negative 02/15/2021 1304   PROTEINUR Negative 02/15/2021 1304   NITRITE Negative 02/15/2021 1304   LEUKOCYTESUR Negative 02/15/2021 1304    Lab Results  Component Value Date   LABMICR Comment 02/15/2021    Pertinent Imaging:  No results found for this or any previous visit.  No results found for this or any previous visit.  No results found for this or any previous visit.  No results found for this or any previous visit.  No results found for this or any previous visit.  No results found for this or any previous visit.  No results found for this or any previous  visit.  No results found for this or any previous visit.   Assessment & Plan:    1. Urinary frequency and nocturia -we will trial mirabegron 25mg  qhs - Urinalysis, Routine w reflex microscopic - BLADDER SCAN AMB NON-IMAGING  2. Gross hematuria -resolved   No follow-ups on file.  Erika Bang, MD  [facility] [place]

## 2021-08-19 NOTE — Patient Instructions (Signed)

## 2021-08-20 NOTE — Progress Notes (Signed)
?  ?Cardiology Office Note ? ? ?Date:  08/20/2021  ? ?ID:  Erika Ramos, DOB 03-28-1946, MRN [id number] ? ?PCP:  Erika Sites, MD  ?Cardiologist:   Erika Rouge, MD  ? ?No chief complaint on file. ? ? ?  ?History of Present Illness: ?Erika Ramos is a 76 y.o. female who presents for f/u of HTN , DM and CAD Previously seen by Dr Verl Blalock ?In 2011 and  by Dr Aundra Dubin in 2014  Had PCI of RCA with stent  back in 2001  Echo 2011 with normal EF 60-65% Cardiolie June 2011 with small apical defect no ischemia EF 69%  In interim she was Rx for breast cancer with lumpectomy chemo and radiation.  ? 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 ?Diet still poor and DM not well controlled  ? ?Complained of chest pain 03/2016 ?Myovue reviewed  ?No ischemia breast attenuation EF 75%  ? ?Husband died of bladder cancer in 03-12-2018  Had been married 24 years He refused Rx 2 years Ago and had an "ugly" death. She seems to be well compensated. Has not been able to go to [employer] Since COVID She is vaccinated  ? ?Has had atypical sharp fleeting SSCP right and left sided lasting seconds. Most of the time at night and not with exertion  ? ? ?Past Medical History:  ?Diagnosis Date  ? Breast cancer ([place])   ? Breast cancer, stage 2, right ([place]) 09/06/2009  ? Qualifier: Diagnosis of  By: Ronne Binning    ? Coronary artery disease   ? Diabetes mellitus without complication ([place])   ? Fatty liver disease, nonalcoholic   ? GERD (gastroesophageal reflux disease)   ? Hypertension   ? PONV (postoperative nausea and vomiting)   ? Nausea/ Vomitting- last time 2010  ? Stented coronary artery   ? ? ?Past Surgical History:  ?Procedure Laterality Date  ? ABDOMINAL HYSTERECTOMY    ? BREAST SURGERY  March 2010  ? cataract  Right 09/2012  ? CATARACT EXTRACTION  01/01/2018  ? left eye  ? COLONOSCOPY WITH PROPOFOL N/A 07/05/2021  ? Procedure: COLONOSCOPY WITH PROPOFOL;  Surgeon: Eloise Harman,  DO;  [facility];  Service: Endoscopy;  Laterality: N/A;  8:45am  ? FRACTURE SURGERY    ? wrist  right  ? PARS PLANA VITRECTOMY  07/03/2011  ? Procedure: PARS PLANA VITRECTOMY WITH 25 GAUGE;  Surgeon: Hayden Pedro, MD;  Location: [facility];  Service: Ophthalmology;  Laterality: Right;  MEMBRANE PEEL, HEAD SCOPE LASER, GAS  ? POLYPECTOMY  07/05/2021  ? Procedure: POLYPECTOMY;  Surgeon: Eloise Harman, DO;  [facility];  Service: Endoscopy;;  ? RETINAL DETACHMENT SURGERY    ? RETINAL DETACHMENT SURGERY  07/03/11  ? right  ? TONSILLECTOMY    ? ? ? ?Current Outpatient Medications  ?Medication Sig Dispense Refill  ? aspirin EC 81 MG tablet Take 81 mg by mouth daily.    ? Calcium-Vitamin D-Vitamin K [id number] MG-UNT-MCG CHEW Chew 1 each by mouth daily.     ? Cyanocobalamin (VITAMIN B-12 PO) Take 1 tablet by mouth daily.    ? dapagliflozin propanediol (FARXIGA) 10 MG TABS tablet Take 10 mg by mouth daily.    ? escitalopram (LEXAPRO) 10 MG tablet Take 10 mg by mouth daily.    ? JANUMET 50-1000 MG tablet Take 1 tablet by mouth 2 (two) times daily with a  meal.     ? mirabegron ER (MYRBETRIQ) 25 MG TB24 tablet Take 1 tablet (25 mg total) by mouth at bedtime. 30 tablet 0  ? Multiple Vitamins-Minerals (CENTRUM SILVER ULTRA WOMENS PO) Take 2 tablets by mouth daily.    ? nitroGLYCERIN (NITROSTAT) 0.4 MG SL tablet Place 1 tablet (0.4 mg total) under the tongue every 5 (five) minutes as needed for chest pain. 25 tablet 3  ? Omega-3 Fatty Acids (FISH OIL PO) Take 1 capsule by mouth daily at 6 (six) AM.    ? omeprazole (PRILOSEC) 20 MG capsule Take 20 mg by mouth daily.    ? ramipril (ALTACE) 10 MG capsule Take 10 mg by mouth daily.    ? simvastatin (ZOCOR) 40 MG tablet Take 40 mg by mouth daily.    ? vitamin C (ASCORBIC ACID) 250 MG tablet Take 500 mg by mouth daily.    ? zinc gluconate 50 MG tablet Take 50 mg by mouth 3 (three) times a week.    ? ?No current facility-administered medications for this visit.   ? ? ?Allergies:   Clopidogrel bisulfate and Oxycontin [oxycodone hcl]  ? ? ?Social History:  The patient  reports that she has never smoked. She has never used smokeless tobacco. She reports that she does not drink alcohol and does not use drugs.  ? ?Family History:  The patient's family history includes Cancer in her brother; Diabetes in her brother, sister, and sister; Heart disease in her brother and mother; Ovarian cancer in her mother; Stroke in her father.  ? ? ?ROS:  Please see the history of present illness.   Otherwise, review of systems are positive for none.   All other systems are reviewed and negative.  ? ? ?PHYSICAL EXAM: ?VS:  There were no vitals taken for this visit. , BMI There is no height or weight on file to calculate BMI. ?Affect appropriate ?Healthy:  appears stated age ?HEENT: normal ?Neck supple with no adenopathy ?JVP normal no bruits no thyromegaly ?Lungs clear with no wheezing and good diaphragmatic motion ?Heart:  S1/S2 no murmur, no rub, gallop or click ?PMI normal ?Abdomen: benighn, BS positve, no tenderness, no AAA ?no bruit.  No HSM or HJR ?Distal pulses intact with no bruits ?No edema ?Neuro non-focal ?Skin warm and dry ?No muscular weakness ? ? ?EKG:  08/26/2021 NSR rate 94 LAD poor R wave progression  ? ?Recent Labs: ?07/01/2021: BUN 18; Creatinine, Ser 0.60; Potassium 3.9; Sodium 136  ? ? ?Lipid Panel ?No results found for: CHOL, TRIG, HDL, CHOLHDL, VLDL, LDLCALC, LDLDIRECT ?  ? ?Wt Readings from Last 3 Encounters:  ?08/19/21 175 lb (79.4 kg)  ?07/01/21 175 lb (79.4 kg)  ?06/05/21 176 lb 6.4 oz (80 kg)  ?  ? ? ?Other studies Reviewed: ?Additional studies/ records that were reviewed today include: Notes / Cath Dr Fletcher Anon and R[facility] Situ ?Notes primary Dr Leveda Anna . ? ? ? ?ASSESSMENT AND PLAN: ? ?1.  CAD  Distant stent RCA 2001 non ischemic myovue 03/2016 continue medical RX Given SSCP although atypical she is diabetic will order exercise myovue  ?2. Chol: on statin labs with primary  ?3. HTN:  Well controlled.  Continue current medications and low sodium Dash type diet.   ?4. DM: Discussed low carb diet and portion control f/u primary target A1c 6.0 ?5. Obesity  Discussed diet and exercise  ?6. Tachycardia:   improved not pathologic  ? ? ?Current medicines are reviewed at length with the patient today.  The patient does not have concerns regarding medicines. ? ?The following changes have been made:  no change ? ?Labs/ tests ordered today include: Ex Myovue  ? ?No orders of the defined types were placed in this encounter. ? ? ? ?Disposition:   FU with me in a year  if myovue low risk  ? ? ? ?Signed, ?Erika Rouge, MD  ?08/20/2021 12:55 PM    ?[facility] ?[address], [place], [place]  [address] ?Phone: [phone]; Fax: [phone]  ?

## 2021-08-26 ENCOUNTER — Encounter: Payer: Self-pay | Admitting: Cardiovascular Disease

## 2021-08-26 ENCOUNTER — Other Ambulatory Visit: Payer: Self-pay

## 2021-08-26 ENCOUNTER — Ambulatory Visit (INDEPENDENT_AMBULATORY_CARE_PROVIDER_SITE_OTHER): Payer: Medicare HMO | Admitting: Cardiovascular Disease

## 2021-08-26 VITALS — BP 136/82 | HR 86 | Ht 65.0 in | Wt 178.0 lb

## 2021-08-26 DIAGNOSIS — I10 Essential (primary) hypertension: Secondary | ICD-10-CM

## 2021-08-26 DIAGNOSIS — I2510 Atherosclerotic heart disease of native coronary artery without angina pectoris: Secondary | ICD-10-CM | POA: Diagnosis not present

## 2021-08-26 DIAGNOSIS — R079 Chest pain, unspecified: Secondary | ICD-10-CM

## 2021-08-26 NOTE — Patient Instructions (Signed)
Testing/Procedures: ?Your physician has requested that you have a exercise myoview. For further information please visit [email or web address]. Please follow instruction sheet, as given. ? ? ?Follow-Up: ?Follow up with Dr. Johnsie Cancel in 1 year ? ?Any Other Special Instructions Will Be Listed Below (If Applicable). ? ? ? ? ?If you need a refill on your cardiac medications before your next appointment, please call your pharmacy. ? ?

## 2021-09-05 ENCOUNTER — Encounter (HOSPITAL_COMMUNITY)
Admission: RE | Admit: 2021-09-05 | Discharge: 2021-09-05 | Disposition: A | Discharge status: Home / Self Care | Payer: Medicare HMO | Source: Ambulatory Visit | Attending: Cardiovascular Disease | Admitting: Cardiovascular Disease

## 2021-09-05 ENCOUNTER — Other Ambulatory Visit: Payer: Self-pay

## 2021-09-05 ENCOUNTER — Ambulatory Visit (HOSPITAL_COMMUNITY)
Admission: RE | Admit: 2021-09-05 | Discharge: 2021-09-05 | Disposition: A | Discharge status: Home / Self Care | Payer: Medicare HMO | Source: Ambulatory Visit | Attending: Cardiovascular Disease | Admitting: Cardiovascular Disease

## 2021-09-05 DIAGNOSIS — R079 Chest pain, unspecified: Secondary | ICD-10-CM | POA: Diagnosis not present

## 2021-09-05 DIAGNOSIS — I2510 Atherosclerotic heart disease of native coronary artery without angina pectoris: Secondary | ICD-10-CM

## 2021-09-05 LAB — NM MYOCAR MULTI W/SPECT W/WALL MOTION / EF
Angina Index: 0
Duke Treadmill Score: 3
Estimated workload: 4.6
Exercise duration (min): 3 min
Exercise duration (sec): 12 s
LV dias vol: 61 mL (ref 46–106)
LV sys vol: 18 mL
MPHR: 146 {beats}/min
Nuc Stress EF: 70 %
Peak HR: 130 {beats}/min
Percent HR: 89 %
RATE: 0.4
RPE: 13
Rest HR: 82 {beats}/min
Rest Nuclear Isotope Dose: 11 mCi
SDS: 3
SRS: 15
SSS: 18
ST Depression (mm): 0 mm
Stress Nuclear Isotope Dose: 31 mCi
TID: 1.16

## 2021-09-05 MED ORDER — REGADENOSON 0.4 MG/5ML IV SOLN
INTRAVENOUS | Status: AC
  Filled 2021-09-05: qty 5

## 2021-09-05 MED ORDER — TECHNETIUM TC 99M TETROFOSMIN IV KIT
30.0000 | PACK | Freq: Once | INTRAVENOUS | Status: AC | PRN
  Administered 2021-09-05: 31 via INTRAVENOUS

## 2021-09-05 MED ORDER — SODIUM CHLORIDE FLUSH 0.9 % IV SOLN
INTRAVENOUS | Status: AC
  Administered 2021-09-05: 10 mL via INTRAVENOUS
  Filled 2021-09-05: qty 10

## 2021-09-05 MED ORDER — TECHNETIUM TC 99M TETROFOSMIN IV KIT
10.0000 | PACK | Freq: Once | INTRAVENOUS | Status: AC | PRN
  Administered 2021-09-05: 11 via INTRAVENOUS

## 2021-09-06 ENCOUNTER — Encounter: Payer: Self-pay | Admitting: Nurse Practitioner

## 2021-09-06 ENCOUNTER — Ambulatory Visit (INDEPENDENT_AMBULATORY_CARE_PROVIDER_SITE_OTHER): Payer: Medicare HMO | Admitting: Nurse Practitioner

## 2021-09-06 VITALS — BP 158/68 | HR 85 | Ht 65.0 in | Wt 179.6 lb

## 2021-09-06 DIAGNOSIS — E785 Hyperlipidemia, unspecified: Secondary | ICD-10-CM | POA: Diagnosis not present

## 2021-09-06 DIAGNOSIS — I10 Essential (primary) hypertension: Secondary | ICD-10-CM

## 2021-09-06 DIAGNOSIS — I25118 Atherosclerotic heart disease of native coronary artery with other forms of angina pectoris: Secondary | ICD-10-CM | POA: Diagnosis not present

## 2021-09-06 DIAGNOSIS — I2510 Atherosclerotic heart disease of native coronary artery without angina pectoris: Secondary | ICD-10-CM | POA: Diagnosis not present

## 2021-09-06 DIAGNOSIS — R9439 Abnormal result of other cardiovascular function study: Secondary | ICD-10-CM

## 2021-09-06 MED ORDER — CARVEDILOL 3.125 MG PO TABS: 3.1250 mg | ORAL_TABLET | Freq: Two times a day (BID) | ORAL | 3 refills | Status: DC

## 2021-09-06 NOTE — Progress Notes (Signed)
?Cardiology Office Note:   ? ?Date:  09/06/2021  ? ?ID:  Erika Ramos, DOB [birth year], MRN [id number] ? ?PCP:  Sharilyn Sites, MD ?  ?[facility] Providers ?Cardiologist:  Jenkins Rouge, MD    ? ?Referring MD: Sharilyn Sites, MD  ? ?Chief Complaint: chest pain ? ?History of Present Illness:   ? ?Erika Ramos is a 76 y.o. female with a hx of CAD s/p PCI RCA/PTCA PDA in 2001, hyperlipidemia, breast cancer s/p lumpectomy followed by chemotherapy and radiation 2009, HTN, and diabetes.  ? ?She established care many years ago and has been seen in the past by Drs. Wall and McLean. She had a negative myoview in 03/2016 following a report of chest pain.  ? ?Her last office visit was with Dr. Johnsie Cancel on 08/26/21 at which time she reported substernal chest pain right and left lasting for a few seconds. Occurring most often at night and not with exertion. Due to history of CAD and diabetes, Dr. Johnsie Cancel ordered an exercise myoview for evaluation of her pain although he felt it was atypical. Myoview completed on 09/05/21 was reported as abnormal by Dr. Johnsie Cancel and she was advised to come in for an appointment to set up cardiac catheterization for next week.  ? ?Today, she is here alone for follow-up of her chest pain and abnormal myocardial perfusion study. She denies chest discomfort at present. States she has short episodes of chest discomfort intermittently that occur on the left and right sides and in her back that last 1 to 2 seconds. This are not intensified movement or activity. Not accompanied by n/v, diaphoresis. She does not exercise on a regular basis. Has been feeling fatigued for 3-4 months and resting more frequently. Will go and lie down to rest, sometimes notices more episodes of chest discomfort when resting. She denies shortness of breath, lower extremity edema, palpitations, melena, hematuria, hemoptysis, diaphoresis, weakness, presyncope, syncope, orthopnea, and PND. ? ? ?Past Medical History:  ?Diagnosis  Date  ? Breast cancer ([place])   ? Breast cancer, stage 2, right ([place]) 09/06/2009  ? Qualifier: Diagnosis of  By: Ronne Binning    ? Coronary artery disease   ? Diabetes mellitus without complication ([place])   ? Fatty liver disease, nonalcoholic   ? GERD (gastroesophageal reflux disease)   ? Hypertension   ? PONV (postoperative nausea and vomiting)   ? Nausea/ Vomitting- last time 2010  ? Stented coronary artery   ? ? ?Past Surgical History:  ?Procedure Laterality Date  ? ABDOMINAL HYSTERECTOMY    ? BREAST SURGERY  March 2010  ? cataract  Right 09/2012  ? CATARACT EXTRACTION  01/01/2018  ? left eye  ? COLONOSCOPY WITH PROPOFOL N/A 07/05/2021  ? Procedure: COLONOSCOPY WITH PROPOFOL;  Surgeon: Eloise Harman, DO;  [facility];  Service: Endoscopy;  Laterality: N/A;  8:45am  ? FRACTURE SURGERY    ? wrist  right  ? PARS PLANA VITRECTOMY  07/03/2011  ? Procedure: PARS PLANA VITRECTOMY WITH 25 GAUGE;  Surgeon: Hayden Pedro, MD;  Location: [facility];  Service: Ophthalmology;  Laterality: Right;  MEMBRANE PEEL, HEAD SCOPE LASER, GAS  ? POLYPECTOMY  07/05/2021  ? Procedure: POLYPECTOMY;  Surgeon: Eloise Harman, DO;  [facility];  Service: Endoscopy;;  ? RETINAL DETACHMENT SURGERY    ? RETINAL DETACHMENT SURGERY  07/03/11  ? right  ? TONSILLECTOMY    ? ? ?Current Medications: ?Current Meds  ?Medication Sig  ? aspirin  EC 81 MG tablet Take 81 mg by mouth daily.  ? Calcium-Vitamin D-Vitamin K 646-803-21 MG-UNT-MCG CHEW Chew 1 each by mouth daily.   ? carvedilol (COREG) 3.125 MG tablet Take 1 tablet (3.125 mg total) by mouth 2 (two) times daily.  ? Cyanocobalamin (VITAMIN B-12 PO) Take 1 tablet by mouth daily.  ? dapagliflozin propanediol (FARXIGA) 10 MG TABS tablet Take 10 mg by mouth daily.  ? escitalopram (LEXAPRO) 10 MG tablet Take 10 mg by mouth daily.  ? JANUMET 50-1000 MG tablet Take 1 tablet by mouth 2 (two) times daily with a meal.   ? mirabegron ER (MYRBETRIQ) 25 MG TB24 tablet Take 1 tablet (25  mg total) by mouth at bedtime.  ? Multiple Vitamins-Minerals (CENTRUM SILVER ULTRA WOMENS PO) Take 2 tablets by mouth daily.  ? nitroGLYCERIN (NITROSTAT) 0.4 MG SL tablet Place 1 tablet (0.4 mg total) under the tongue every 5 (five) minutes as needed for chest pain.  ? Omega-3 Fatty Acids (FISH OIL PO) Take 1 capsule by mouth daily at 6 (six) AM.  ? omeprazole (PRILOSEC) 20 MG capsule Take 20 mg by mouth daily.  ? ramipril (ALTACE) 10 MG capsule Take 10 mg by mouth daily.  ? simvastatin (ZOCOR) 40 MG tablet Take 40 mg by mouth daily.  ? vitamin C (ASCORBIC ACID) 250 MG tablet Take 500 mg by mouth daily.  ? zinc gluconate 50 MG tablet Take 50 mg by mouth 3 (three) times a week.  ?  ? ?Allergies:   Clopidogrel bisulfate and Oxycontin [oxycodone hcl]  ? ?Social History  ? ?Socioeconomic History  ? Marital status: Divorced  ?  Spouse name: Not on file  ? Number of children: Not on file  ? Years of education: Not on file  ? Highest education level: Not on file  ?Occupational History  ? Not on file  ?Tobacco Use  ? Smoking status: Never  ? Smokeless tobacco: Never  ?Vaping Use  ? Vaping Use: Never used  ?Substance and Sexual Activity  ? Alcohol use: No  ? Drug use: No  ? Sexual activity: Not on file  ?  Comment: single-2 grown children  ?Other Topics Concern  ? Not on file  ?Social History Narrative  ? Not on file  ? ?Social Determinants of Health  ? ?Financial Resource Strain: Not on file  ?Food Insecurity: Not on file  ?Transportation Needs: Not on file  ?Physical Activity: Not on file  ?Stress: Not on file  ?Social Connections: Not on file  ?  ? ?Family History: ?The patient's family history includes Cancer in her brother; Diabetes in her brother, sister, and sister; Heart disease in her brother and mother; Ovarian cancer in her mother; Stroke in her father. There is no history of Anesthesia problems. ? ?ROS:   ?Please see the history of present illness.    ?+ occasional episodes of chest discomfort ?+ fatigue ?All  other systems reviewed and are negative. ? ?Labs/Other Studies Reviewed:   ? ?The following studies were reviewed today: ? ?Exercise Myovue 09/05/21 ? ? ?  Findings are consistent with prior inferior/inferoapical myocardial infarction with mild to moderate peri-infarct ischemia. Low to intermediate risk ?  No ST deviation was noted. ?  LV perfusion is abnormal. Large moderate intensity inferior/inferoapical defect with mild to moderate reversibility. ?  Left ventricular function is normal. End diastolic cavity size is normal. ?  ? ?Exercise Myoview 03/2016 ? ?The patient exercised following the Bruce protocol.  ?The patient reported shortness  of breath during the stress test. The patient experienced no angina during the stress test.  ? ?The patient requested the test to be stopped. The test was stopped because the patient complained of shortness of breath.  ? ?Heart rate demonstrated a normal response to exercise. Blood pressure demonstrated a hypertensive response to exercise. Overall, the patient's exercise capacity was normal.  ? ?85% of maximum heart rate was achieved after 3.3 minutes. Recovery time: 7 minutes. The patient's response to exercise was adequate for diagnosis  ? ? ?Recent Labs: ?07/01/2021: BUN 18; Creatinine, Ser 0.60; Potassium 3.9; Sodium 136  ?Recent Lipid Panel ?No results found for: CHOL, TRIG, HDL, CHOLHDL, VLDL, LDLCALC, LDLDIRECT ? ? ?Risk Assessment/Calculations:   ?  ? ? ?Physical Exam:   ? ?VS:  BP (!) 158/68 (BP Location: Left Arm, Patient Position: Sitting, Cuff Size: Normal)   Pulse 85   Ht '5\' 5"'$  (1.651 m)   Wt 179 lb 9.6 oz (81.5 kg)   SpO2 96%   BMI 29.89 kg/m?    ? ?Wt Readings from Last 3 Encounters:  ?09/06/21 179 lb 9.6 oz (81.5 kg)  ?08/26/21 178 lb (80.7 kg)  ?08/19/21 175 lb (79.4 kg)  ?  ? ?GEN:  Well nourished, well developed in no acute distress ?HEENT: Normal ?NECK: No JVD; No carotid bruits ?CARDIAC: RRR, no murmurs, rubs, gallops ?RESPIRATORY:  Clear to auscultation  without rales, wheezing or rhonchi  ?ABDOMEN: Soft, non-tender, non-distended ?MUSCULOSKELETAL:  No edema; No deformity. 2+ pedal pulses, equal bilaterally ?SKIN: Warm and dry ?NEUROLOGIC:  Alert and oriented

## 2021-09-06 NOTE — Patient Instructions (Addendum)
Medication Instructions:  ?Your physician has recommended you make the following change in your medication: ?1.Start carvedilol 3.125 mg twice a day ?*If you need a refill on your cardiac medications before your next appointment, please call your pharmacy* ? ? ?Lab Work: ?BMET and CBC today ?If you have labs (blood work) drawn today and your tests are completely normal, you will receive your results only by: ?MyChart Message (if you have MyChart) OR ?A paper copy in the mail ?If you have any lab test that is abnormal or we need to change your treatment, we will call you to review the results. ? ? ?Testing/Procedures: ? ?[facility] ?[place] OFFICE ?[address], [address] ?[place] [address] ?Dept: [phone] ?Loc: [phone] ? ?ALLA SLOMA  09/06/2021 ? ?You are scheduled for a Cardiac Catheterization on Tuesday, March 21 with Dr. Daneen Schick. ? ?1. Please arrive at the Main Entrance A at [facility]: [place], [place] [address] at 7:00 AM (This time is two hours before your procedure to ensure your preparation). Free valet parking service is available.  ? ?Special note: Every effort is made to have your procedure done on time. Please understand that emergencies sometimes delay scheduled procedures. ? ?2. Diet: Do not eat solid foods after midnight.  You may have clear liquids until 5 AM upon the day of the procedure. ? ?3. Labs: You will need to have blood drawn today. You do not need to be fasting. ? ?4. Medication instructions in preparation for your procedure: ? ? Contrast Allergy: No ? ?Do not take Diabetes Med Janumet (Metformin + Sitagliptin) on the day of the procedure and HOLD 48 HOURS AFTER THE PROCEDURE. ? ?On the morning of your procedure, take Aspirin 81 mg and any morning medicines NOT listed above.  You may use sips of water. ? ?5. Plan to go home the same day, you will only stay overnight if  medically necessary. ?6. You MUST have a responsible adult to drive you home. ?7. An adult MUST be with you the first 24 hours after you arrive home. ?8. Bring a current list of your medications, and the last time and date medication taken. ?9. Bring ID and current insurance cards. ?10.Please wear clothes that are easy to get on and off and wear slip-on shoes. ? ?Thank you for allowing [place] to care for you! ?  -- [facility]  ? ? ?Follow-Up: ?At [facility], you and your health needs are our priority.  As part of our continuing mission to provide you with exceptional heart care, we have created designated Provider Care Teams.  These Care Teams include your primary Cardiologist (physician) and Advanced Practice Providers (APPs -  Physician Assistants and Nurse Practitioners) who all work together to provide you with the care you need, when you need it. ? ?We recommend signing up for the patient portal called "MyChart".  Sign up information is provided on this After Visit Summary.  MyChart is used to connect with patients for Virtual Visits (Telemedicine).  Patients are able to view lab/test results, encounter notes, upcoming appointments, etc.  Non-urgent messages can be sent to your provider as well.   ?To learn more about what you can do with MyChart, go to [email or web address].   ? ?Your next appointment:   ?10/24/2021 at 1:30 PM ? ?The format for your next appointment:   ?In Person ? ?Provider:   ?Christen Bame,  NP     ?

## 2021-09-06 NOTE — H&P (View-Only) (Signed)
?Cardiology Office Note:   ? ?Date:  09/06/2021  ? ?ID:  Erika Ramos, DOB [birth year], MRN [id number] ? ?PCP:  Sharilyn Sites, MD ?  ?[facility] Providers ?Cardiologist:  Jenkins Rouge, MD    ? ?Referring MD: Sharilyn Sites, MD  ? ?Chief Complaint: chest pain ? ?History of Present Illness:   ? ?Erika Ramos is a 76 y.o. female with a hx of CAD s/p PCI RCA/PTCA PDA in 2001, hyperlipidemia, breast cancer s/p lumpectomy followed by chemotherapy and radiation 2009, HTN, and diabetes.  ? ?She established care many years ago and has been seen in the past by Drs. Wall and McLean. She had a negative myoview in 03/2016 following a report of chest pain.  ? ?Her last office visit was with Dr. Johnsie Cancel on 08/26/21 at which time she reported substernal chest pain right and left lasting for a few seconds. Occurring most often at night and not with exertion. Due to history of CAD and diabetes, Dr. Johnsie Cancel ordered an exercise myoview for evaluation of her pain although he felt it was atypical. Myoview completed on 09/05/21 was reported as abnormal by Dr. Johnsie Cancel and she was advised to come in for an appointment to set up cardiac catheterization for next week.  ? ?Today, she is here alone for follow-up of her chest pain and abnormal myocardial perfusion study. She denies chest discomfort at present. States she has short episodes of chest discomfort intermittently that occur on the left and right sides and in her back that last 1 to 2 seconds. This are not intensified movement or activity. Not accompanied by n/v, diaphoresis. She does not exercise on a regular basis. Has been feeling fatigued for 3-4 months and resting more frequently. Will go and lie down to rest, sometimes notices more episodes of chest discomfort when resting. She denies shortness of breath, lower extremity edema, palpitations, melena, hematuria, hemoptysis, diaphoresis, weakness, presyncope, syncope, orthopnea, and PND. ? ? ?Past Medical History:  ?Diagnosis  Date  ? Breast cancer ([place])   ? Breast cancer, stage 2, right ([place]) 09/06/2009  ? Qualifier: Diagnosis of  By: Ronne Binning    ? Coronary artery disease   ? Diabetes mellitus without complication ([place])   ? Fatty liver disease, nonalcoholic   ? GERD (gastroesophageal reflux disease)   ? Hypertension   ? PONV (postoperative nausea and vomiting)   ? Nausea/ Vomitting- last time 2010  ? Stented coronary artery   ? ? ?Past Surgical History:  ?Procedure Laterality Date  ? ABDOMINAL HYSTERECTOMY    ? BREAST SURGERY  March 2010  ? cataract  Right 09/2012  ? CATARACT EXTRACTION  01/01/2018  ? left eye  ? COLONOSCOPY WITH PROPOFOL N/A 07/05/2021  ? Procedure: COLONOSCOPY WITH PROPOFOL;  Surgeon: Eloise Harman, DO;  [facility];  Service: Endoscopy;  Laterality: N/A;  8:45am  ? FRACTURE SURGERY    ? wrist  right  ? PARS PLANA VITRECTOMY  07/03/2011  ? Procedure: PARS PLANA VITRECTOMY WITH 25 GAUGE;  Surgeon: Hayden Pedro, MD;  Location: [place];  Service: Ophthalmology;  Laterality: Right;  MEMBRANE PEEL, HEAD SCOPE LASER, GAS  ? POLYPECTOMY  07/05/2021  ? Procedure: POLYPECTOMY;  Surgeon: Eloise Harman, DO;  [facility];  Service: Endoscopy;;  ? RETINAL DETACHMENT SURGERY    ? RETINAL DETACHMENT SURGERY  07/03/11  ? right  ? TONSILLECTOMY    ? ? ?Current Medications: ?Current Meds  ?Medication Sig  ? aspirin  EC 81 MG tablet Take 81 mg by mouth daily.  ? Calcium-Vitamin D-Vitamin K 671-245-80 MG-UNT-MCG CHEW Chew 1 each by mouth daily.   ? carvedilol (COREG) 3.125 MG tablet Take 1 tablet (3.125 mg total) by mouth 2 (two) times daily.  ? Cyanocobalamin (VITAMIN B-12 PO) Take 1 tablet by mouth daily.  ? dapagliflozin propanediol (FARXIGA) 10 MG TABS tablet Take 10 mg by mouth daily.  ? escitalopram (LEXAPRO) 10 MG tablet Take 10 mg by mouth daily.  ? JANUMET 50-1000 MG tablet Take 1 tablet by mouth 2 (two) times daily with a meal.   ? mirabegron ER (MYRBETRIQ) 25 MG TB24 tablet Take 1 tablet (25  mg total) by mouth at bedtime.  ? Multiple Vitamins-Minerals (CENTRUM SILVER ULTRA WOMENS PO) Take 2 tablets by mouth daily.  ? nitroGLYCERIN (NITROSTAT) 0.4 MG SL tablet Place 1 tablet (0.4 mg total) under the tongue every 5 (five) minutes as needed for chest pain.  ? Omega-3 Fatty Acids (FISH OIL PO) Take 1 capsule by mouth daily at 6 (six) AM.  ? omeprazole (PRILOSEC) 20 MG capsule Take 20 mg by mouth daily.  ? ramipril (ALTACE) 10 MG capsule Take 10 mg by mouth daily.  ? simvastatin (ZOCOR) 40 MG tablet Take 40 mg by mouth daily.  ? vitamin C (ASCORBIC ACID) 250 MG tablet Take 500 mg by mouth daily.  ? zinc gluconate 50 MG tablet Take 50 mg by mouth 3 (three) times a week.  ?  ? ?Allergies:   Clopidogrel bisulfate and Oxycontin [oxycodone hcl]  ? ?Social History  ? ?Socioeconomic History  ? Marital status: Divorced  ?  Spouse name: Not on file  ? Number of children: Not on file  ? Years of education: Not on file  ? Highest education level: Not on file  ?Occupational History  ? Not on file  ?Tobacco Use  ? Smoking status: Never  ? Smokeless tobacco: Never  ?Vaping Use  ? Vaping Use: Never used  ?Substance and Sexual Activity  ? Alcohol use: No  ? Drug use: No  ? Sexual activity: Not on file  ?  Comment: single-2 grown children  ?Other Topics Concern  ? Not on file  ?Social History Narrative  ? Not on file  ? ?Social Determinants of Health  ? ?Financial Resource Strain: Not on file  ?Food Insecurity: Not on file  ?Transportation Needs: Not on file  ?Physical Activity: Not on file  ?Stress: Not on file  ?Social Connections: Not on file  ?  ? ?Family History: ?The patient's family history includes Cancer in her brother; Diabetes in her brother, sister, and sister; Heart disease in her brother and mother; Ovarian cancer in her mother; Stroke in her father. There is no history of Anesthesia problems. ? ?ROS:   ?Please see the history of present illness.    ?+ occasional episodes of chest discomfort ?+ fatigue ?All  other systems reviewed and are negative. ? ?Labs/Other Studies Reviewed:   ? ?The following studies were reviewed today: ? ?Exercise Myovue 09/05/21 ? ? ?  Findings are consistent with prior inferior/inferoapical myocardial infarction with mild to moderate peri-infarct ischemia. Low to intermediate risk ?  No ST deviation was noted. ?  LV perfusion is abnormal. Large moderate intensity inferior/inferoapical defect with mild to moderate reversibility. ?  Left ventricular function is normal. End diastolic cavity size is normal. ?  ? ?Exercise Myoview 03/2016 ? ?The patient exercised following the Bruce protocol.  ?The patient reported shortness  of breath during the stress test. The patient experienced no angina during the stress test.  ? ?The patient requested the test to be stopped. The test was stopped because the patient complained of shortness of breath.  ? ?Heart rate demonstrated a normal response to exercise. Blood pressure demonstrated a hypertensive response to exercise. Overall, the patient's exercise capacity was normal.  ? ?85% of maximum heart rate was achieved after 3.3 minutes. Recovery time: 7 minutes. The patient's response to exercise was adequate for diagnosis  ? ? ?Recent Labs: ?07/01/2021: BUN 18; Creatinine, Ser 0.60; Potassium 3.9; Sodium 136  ?Recent Lipid Panel ?No results found for: CHOL, TRIG, HDL, CHOLHDL, VLDL, LDLCALC, LDLDIRECT ? ? ?Risk Assessment/Calculations:   ?  ? ? ?Physical Exam:   ? ?VS:  BP (!) 158/68 (BP Location: Left Arm, Patient Position: Sitting, Cuff Size: Normal)   Pulse 85   Ht '5\' 5"'$  (1.651 m)   Wt 179 lb 9.6 oz (81.5 kg)   SpO2 96%   BMI 29.89 kg/m?    ? ?Wt Readings from Last 3 Encounters:  ?09/06/21 179 lb 9.6 oz (81.5 kg)  ?08/26/21 178 lb (80.7 kg)  ?08/19/21 175 lb (79.4 kg)  ?  ? ?GEN:  Well nourished, well developed in no acute distress ?HEENT: Normal ?NECK: No JVD; No carotid bruits ?CARDIAC: RRR, no murmurs, rubs, gallops ?RESPIRATORY:  Clear to auscultation  without rales, wheezing or rhonchi  ?ABDOMEN: Soft, non-tender, non-distended ?MUSCULOSKELETAL:  No edema; No deformity. 2+ pedal pulses, equal bilaterally ?SKIN: Warm and dry ?NEUROLOGIC:  Alert and oriented

## 2021-09-07 LAB — CBC
Hematocrit: 43 % (ref 34.0–46.6)
Hemoglobin: 14.1 g/dL (ref 11.1–15.9)
MCH: 26.3 pg — ABNORMAL LOW (ref 26.6–33.0)
MCHC: 32.8 g/dL (ref 31.5–35.7)
MCV: 80 fL (ref 79–97)
Platelets: 210 10*3/uL (ref 150–450)
RBC: 5.36 x10E6/uL — ABNORMAL HIGH (ref 3.77–5.28)
RDW: 12.9 % (ref 11.7–15.4)
WBC: 8.1 10*3/uL (ref 3.4–10.8)

## 2021-09-07 LAB — BASIC METABOLIC PANEL
BUN/Creatinine Ratio: 26 (ref 12–28)
BUN: 16 mg/dL (ref 8–27)
CO2: 24 mmol/L (ref 20–29)
Calcium: 9.8 mg/dL (ref 8.7–10.3)
Chloride: 99 mmol/L (ref 96–106)
Creatinine, Ser: 0.61 mg/dL (ref 0.57–1.00)
Glucose: 148 mg/dL — ABNORMAL HIGH (ref 70–99)
Potassium: 4.2 mmol/L (ref 3.5–5.2)
Sodium: 139 mmol/L (ref 134–144)
eGFR: 94 mL/min/{1.73_m2} (ref >59)

## 2021-09-09 ENCOUNTER — Telehealth: Payer: Self-pay | Admitting: *Deleted

## 2021-09-09 NOTE — Telephone Encounter (Signed)
No answer, mailbox full

## 2021-09-09 NOTE — Telephone Encounter (Signed)
Cardiac Catheterization scheduled at [facility] ([facility] for: Tuesday September 10, 2021 9 AM ?Arrival time and place: [place] Entrance A at: 7 AM ? ? ?No solid food after midnight prior to cath, clear liquids until 5 AM day of procedure. ? ?Medication instructions: ?-Hold: ? Janumet-day of procedure and 48 hours post procedure ? Farxiga-AM of procedure ?-Except hold medications usual morning medications can be taken with sips of water including aspirin 81 mg. ? ?Confirmed patient has responsible adult to drive home post procedure and be with patient first 24 hours after arriving home: ? ?One visitor is allowed to stay in the waiting room during the time you are at the hospital for your procedure.  ? ?Patient reports no symptoms concerning for COVID-19/no exposure to COVID-19 in the past 10 days. ? ?Call placed to patient to review procedure instructions, no answer, mailbox full. ? ?

## 2021-09-09 NOTE — H&P (Signed)
Recent atypical symptoms. ?Evidence of prior infarction with moderate peri-infarct ischemia on nuclear study.  Cath is being performed to define anatomy and help guide therapy. ?

## 2021-09-09 NOTE — Telephone Encounter (Signed)
Reviewed procedure instructions with patient.  

## 2021-09-10 ENCOUNTER — Ambulatory Visit (HOSPITAL_COMMUNITY)
Admission: RE | Disposition: A | Discharge status: Home / Self Care | Payer: Self-pay | Source: Ambulatory Visit | Attending: Interventional Cardiology

## 2021-09-10 ENCOUNTER — Other Ambulatory Visit: Payer: Self-pay

## 2021-09-10 ENCOUNTER — Ambulatory Visit (HOSPITAL_COMMUNITY)
Admission: RE | Admit: 2021-09-10 | Discharge: 2021-09-10 | Disposition: A | Discharge status: Home / Self Care | Payer: Medicare HMO | Source: Ambulatory Visit | Attending: Interventional Cardiology | Admitting: Interventional Cardiology

## 2021-09-10 ENCOUNTER — Encounter (HOSPITAL_COMMUNITY): Payer: Self-pay | Admitting: Interventional Cardiology

## 2021-09-10 DIAGNOSIS — I2510 Atherosclerotic heart disease of native coronary artery without angina pectoris: Secondary | ICD-10-CM | POA: Diagnosis not present

## 2021-09-10 DIAGNOSIS — E669 Obesity, unspecified: Secondary | ICD-10-CM | POA: Diagnosis present

## 2021-09-10 DIAGNOSIS — Z7984 Long term (current) use of oral hypoglycemic drugs: Secondary | ICD-10-CM | POA: Diagnosis not present

## 2021-09-10 DIAGNOSIS — E119 Type 2 diabetes mellitus without complications: Secondary | ICD-10-CM | POA: Insufficient documentation

## 2021-09-10 DIAGNOSIS — E785 Hyperlipidemia, unspecified: Secondary | ICD-10-CM | POA: Insufficient documentation

## 2021-09-10 DIAGNOSIS — Z853 Personal history of malignant neoplasm of breast: Secondary | ICD-10-CM | POA: Diagnosis not present

## 2021-09-10 DIAGNOSIS — Z79899 Other long term (current) drug therapy: Secondary | ICD-10-CM | POA: Insufficient documentation

## 2021-09-10 DIAGNOSIS — Z9221 Personal history of antineoplastic chemotherapy: Secondary | ICD-10-CM | POA: Diagnosis not present

## 2021-09-10 DIAGNOSIS — R9439 Abnormal result of other cardiovascular function study: Secondary | ICD-10-CM

## 2021-09-10 DIAGNOSIS — I10 Essential (primary) hypertension: Secondary | ICD-10-CM | POA: Insufficient documentation

## 2021-09-10 DIAGNOSIS — I25118 Atherosclerotic heart disease of native coronary artery with other forms of angina pectoris: Secondary | ICD-10-CM | POA: Insufficient documentation

## 2021-09-10 DIAGNOSIS — Z923 Personal history of irradiation: Secondary | ICD-10-CM | POA: Insufficient documentation

## 2021-09-10 DIAGNOSIS — Z7982 Long term (current) use of aspirin: Secondary | ICD-10-CM | POA: Insufficient documentation

## 2021-09-10 DIAGNOSIS — K219 Gastro-esophageal reflux disease without esophagitis: Secondary | ICD-10-CM | POA: Diagnosis not present

## 2021-09-10 DIAGNOSIS — Z955 Presence of coronary angioplasty implant and graft: Secondary | ICD-10-CM | POA: Diagnosis not present

## 2021-09-10 HISTORY — PX: LEFT HEART CATH AND CORONARY ANGIOGRAPHY: CATH118249

## 2021-09-10 LAB — GLUCOSE, CAPILLARY
Glucose-Capillary: 240 mg/dL — ABNORMAL HIGH (ref 70–99)
Glucose-Capillary: 195 mg/dL — ABNORMAL HIGH (ref 70–99)

## 2021-09-10 SURGERY — LEFT HEART CATH AND CORONARY ANGIOGRAPHY
Anesthesia: LOCAL

## 2021-09-10 MED ORDER — SODIUM CHLORIDE 0.9 % WEIGHT BASED INFUSION
3.0000 mL/kg/h | INTRAVENOUS | Status: AC
  Administered 2021-09-10: 3 mL/kg/h via INTRAVENOUS

## 2021-09-10 MED ORDER — VERAPAMIL HCL 2.5 MG/ML IV SOLN
INTRAVENOUS | Status: DC | PRN
  Administered 2021-09-10: 10 mL via INTRA_ARTERIAL

## 2021-09-10 MED ORDER — SODIUM CHLORIDE 0.9% FLUSH: 3.0000 mL | Freq: Two times a day (BID) | INTRAVENOUS | Status: DC

## 2021-09-10 MED ORDER — SODIUM CHLORIDE 0.9 % IV SOLN: INTRAVENOUS | Status: AC

## 2021-09-10 MED ORDER — LIDOCAINE HCL (PF) 1 % IJ SOLN
INTRAMUSCULAR | Status: AC
  Filled 2021-09-10: qty 30

## 2021-09-10 MED ORDER — VERAPAMIL HCL 2.5 MG/ML IV SOLN
INTRAVENOUS | Status: AC
  Filled 2021-09-10: qty 2

## 2021-09-10 MED ORDER — HEPARIN (PORCINE) IN NACL 1000-0.9 UT/500ML-% IV SOLN
INTRAVENOUS | Status: AC
  Filled 2021-09-10: qty 500

## 2021-09-10 MED ORDER — MIDAZOLAM HCL 2 MG/2ML IJ SOLN
INTRAMUSCULAR | Status: DC | PRN
Start: 2021-09-10 — End: 2021-09-10
  Administered 2021-09-10: .5 mg via INTRAVENOUS
  Administered 2021-09-10: 1 mg via INTRAVENOUS

## 2021-09-10 MED ORDER — FENTANYL CITRATE (PF) 100 MCG/2ML IJ SOLN
INTRAMUSCULAR | Status: AC
  Filled 2021-09-10: qty 2

## 2021-09-10 MED ORDER — MIDAZOLAM HCL 2 MG/2ML IJ SOLN
INTRAMUSCULAR | Status: AC
  Filled 2021-09-10: qty 2

## 2021-09-10 MED ORDER — SODIUM CHLORIDE 0.9 % WEIGHT BASED INFUSION: 1.0000 mL/kg/h | INTRAVENOUS | Status: DC

## 2021-09-10 MED ORDER — LIDOCAINE HCL (PF) 1 % IJ SOLN
INTRAMUSCULAR | Status: DC | PRN
  Administered 2021-09-10: 2 mL
  Administered 2021-09-10: 10 mL

## 2021-09-10 MED ORDER — FENTANYL CITRATE (PF) 100 MCG/2ML IJ SOLN
INTRAMUSCULAR | Status: DC | PRN
  Administered 2021-09-10 (×2): 25 ug via INTRAVENOUS

## 2021-09-10 MED ORDER — HEPARIN SODIUM (PORCINE) 1000 UNIT/ML IJ SOLN
INTRAMUSCULAR | Status: AC
  Filled 2021-09-10: qty 10

## 2021-09-10 MED ORDER — HYDRALAZINE HCL 20 MG/ML IJ SOLN: 10.0000 mg | INTRAMUSCULAR | Status: DC | PRN

## 2021-09-10 MED ORDER — IOHEXOL 350 MG/ML SOLN
INTRAVENOUS | Status: DC | PRN
  Administered 2021-09-10: 70 mL

## 2021-09-10 MED ORDER — ONDANSETRON HCL 4 MG/2ML IJ SOLN: 4.0000 mg | Freq: Four times a day (QID) | INTRAMUSCULAR | Status: DC | PRN

## 2021-09-10 MED ORDER — LABETALOL HCL 5 MG/ML IV SOLN: 10.0000 mg | INTRAVENOUS | Status: DC | PRN

## 2021-09-10 MED ORDER — SODIUM CHLORIDE 0.9 % IV SOLN: 250.0000 mL | INTRAVENOUS | Status: DC | PRN

## 2021-09-10 MED ORDER — ASPIRIN 81 MG PO CHEW: 81.0000 mg | CHEWABLE_TABLET | ORAL | Status: DC

## 2021-09-10 MED ORDER — SODIUM CHLORIDE 0.9% FLUSH: 3.0000 mL | INTRAVENOUS | Status: DC | PRN

## 2021-09-10 MED ORDER — HEPARIN (PORCINE) IN NACL 1000-0.9 UT/500ML-% IV SOLN
INTRAVENOUS | Status: DC | PRN
  Administered 2021-09-10 (×2): 500 mL

## 2021-09-10 SURGICAL SUPPLY — 15 items
BAND CMPR LRG ZPHR (HEMOSTASIS) ×1
BAND ZEPHYR COMPRESS 30 LONG (HEMOSTASIS) ×1 IMPLANT
CATH INFINITI 5 FR JL3.5 (CATHETERS) ×1 IMPLANT
CATH INFINITI 5FR JL4 (CATHETERS) ×1 IMPLANT
CATH INFINITI JR4 5F (CATHETERS) ×1 IMPLANT
GLIDESHEATH SLEND A-KIT 6F 22G (SHEATH) ×1 IMPLANT
GUIDEWIRE INQWIRE 1.5J.035X260 (WIRE) IMPLANT
INQWIRE 1.5J .035X260CM (WIRE) ×2
KIT HEART LEFT (KITS) ×2 IMPLANT
PACK CARDIAC CATHETERIZATION (CUSTOM PROCEDURE TRAY) ×2 IMPLANT
SHEATH PINNACLE 5F 10CM (SHEATH) ×1 IMPLANT
SHEATH PROBE COVER 6X72 (BAG) ×1 IMPLANT
TRANSDUCER W/STOPCOCK (MISCELLANEOUS) ×2 IMPLANT
TUBING CIL FLEX 10 FLL-RA (TUBING) ×2 IMPLANT
WIRE EMERALD 3MM-J .035X150CM (WIRE) ×1 IMPLANT

## 2021-09-10 NOTE — Interval H&P Note (Signed)
Cath Lab Visit (complete for each Cath Lab visit) ? ?Clinical Evaluation Leading to the Procedure:  ? ?ACS: No. ? ?Non-ACS:   ? ?Anginal Classification: CCS II ? ?Anti-ischemic medical therapy: Minimal Therapy (1 class of medications) ? ?Non-Invasive Test Results: Intermediate-risk stress test findings: cardiac mortality 1-3%/year ? ?Prior CABG: No previous CABG ? ? ? ? ? ?History and Physical Interval Note: ? ?09/10/2021 ?8:51 AM ? ?Erika Ramos  has presented today for surgery, with the diagnosis of cad.  The various methods of treatment have been discussed with the patient and family. After consideration of risks, benefits and other options for treatment, the patient has consented to  Procedure(s): ?LEFT HEART CATH AND CORONARY ANGIOGRAPHY (N/A) as a surgical intervention.  The patient's history has been reviewed, patient examined, no change in status, stable for surgery.  I have reviewed the patient's chart and labs.  Questions were answered to the patient's satisfaction.   ? ? ?Erika Ramos ? ? ?

## 2021-09-10 NOTE — CV Procedure (Signed)
Patient with heavy three-vessel calcification. ?70% mid and 90% segmental distal to apical LAD stenosis.  Small branch of first diagonal 90% stenosis. ?Left main and circumflex are widely patent. ?Right coronary contains segmental 50% mid vessel stenosis.  Widely patent distal RCA stent.  Segmental 90% mid to to distal PDA calcified stenosis. ?Normal LVEDP and normal LV function. ?Aggressive preventive therapy, control of blood pressure, diabetes control, and anti-ischemic therapy if she develops angina. ?Discussed findings with the patient. ?

## 2021-09-10 NOTE — Progress Notes (Signed)
Site area: Right groin a 5 french arterial sheath was removed ? ?Site Prior to Removal:  Level 0 ? ?Pressure Applied For 20 MINUTES   ? ?Bedrest Beginning at 1045am X 4 hours ? ?Manual:   Yes.   ? ?Patient Status During Pull:  stable ? ?Post Pull Groin Site:  Level 0 ? ?Post Pull Instructions Given:  Yes.   ? ?Post Pull Pulses Present:  Yes.   ? ?Dressing Applied:  Yes.   ? ?Comments:    ?

## 2021-09-11 MED FILL — Heparin Sodium (Porcine) Inj 1000 Unit/ML: INTRAMUSCULAR | Qty: 10 | Status: AC

## 2021-09-16 ENCOUNTER — Telehealth: Payer: Self-pay | Admitting: Cardiovascular Disease

## 2021-09-16 DIAGNOSIS — I25118 Atherosclerotic heart disease of native coronary artery with other forms of angina pectoris: Secondary | ICD-10-CM

## 2021-09-16 MED ORDER — CARVEDILOL 6.25 MG PO TABS: 6.2500 mg | ORAL_TABLET | Freq: Two times a day (BID) | ORAL | 3 refills | Status: DC

## 2021-09-16 MED ORDER — ROSUVASTATIN CALCIUM 40 MG PO TABS
40.0000 mg | ORAL_TABLET | Freq: Every day | ORAL | 3 refills | Status: DC
Start: 2021-09-16 — End: 2021-10-30

## 2021-09-16 NOTE — Telephone Encounter (Signed)
Per Dr. Tamala Julian will increase Coreg 6.25 mg BID, change Zocor 40 mg to Crestor 40 mg daily, and get lab work in 6 weeks. Patient will check her BP and HR, and if she has any issues to give our office a call.  Patient verbalized understanding. ?

## 2021-09-16 NOTE — Telephone Encounter (Signed)
Patient called stating she has a cardiac cath last week.  She was told her blockages was going to be treated with medications.  She states nothing has been called in to her pharmacy she is calling in to inquire about this.  ?

## 2021-09-16 NOTE — Telephone Encounter (Signed)
Called patient back. Patient stated that Dr. Tamala Julian had told her daughter that he would start patient on something, did not recall what it is, after being discharged home after heart cath. Will forward to Dr. Tamala Julian to see what medication if any, he would like to start. ? ?

## 2021-09-17 ENCOUNTER — Telehealth: Payer: Self-pay | Admitting: Cardiovascular Disease

## 2021-09-17 NOTE — Telephone Encounter (Signed)
See notes regarding patient's medication changes from 09/16/21. ?Yes, we should postpone office visit to be after the labs. Ideally, she should see Dr. Johnsie Cancel in follow-up. If he does not have anything available, I am happy to see her.  ?

## 2021-09-17 NOTE — Telephone Encounter (Signed)
Patient calling to see if she needs to move her OV appt on 5/4 back after she has her labs done. Please advise  ?

## 2021-09-17 NOTE — Telephone Encounter (Signed)
Moved patient's appointment with Christen Bame NP to an appointment after lab work is complete.  ?

## 2021-10-22 DIAGNOSIS — H26492 Other secondary cataract, left eye: Secondary | ICD-10-CM | POA: Diagnosis not present

## 2021-10-24 ENCOUNTER — Ambulatory Visit: Payer: Medicare HMO | Admitting: Nurse Practitioner

## 2021-10-28 ENCOUNTER — Other Ambulatory Visit: Payer: Medicare HMO | Admitting: *Deleted

## 2021-10-28 DIAGNOSIS — I25118 Atherosclerotic heart disease of native coronary artery with other forms of angina pectoris: Secondary | ICD-10-CM | POA: Diagnosis not present

## 2021-10-28 LAB — HEPATIC FUNCTION PANEL
ALT: 32 IU/L (ref 0–32)
AST: 34 IU/L (ref 0–40)
Albumin: 4.4 g/dL (ref 3.7–4.7)
Alkaline Phosphatase: 72 IU/L (ref 44–121)
Bilirubin Total: 0.5 mg/dL (ref 0.0–1.2)
Bilirubin, Direct: 0.14 mg/dL (ref 0.00–0.40)
Total Protein: 6.6 g/dL (ref 6.0–8.5)

## 2021-10-28 LAB — LIPID PANEL
Chol/HDL Ratio: 4 ratio (ref 0.0–4.4)
Cholesterol, Total: 181 mg/dL (ref 100–199)
HDL: 45 mg/dL (ref >39)
LDL Chol Calc (NIH): 106 mg/dL — ABNORMAL HIGH (ref 0–99)
Triglycerides: 173 mg/dL — ABNORMAL HIGH (ref 0–149)
VLDL Cholesterol Cal: 30 mg/dL (ref 5–40)

## 2021-10-28 NOTE — Progress Notes (Signed)
?Cardiology Office Note:   ? ?Date:  10/30/2021  ? ?ID:  NOBLE BODIE, DOB July 21, 1945, MRN [id number] ? ?PCP:  Sharilyn Sites, MD ?  ?[facility] Providers ?Cardiologist:  Jenkins Rouge, MD    ? ?Referring MD: Sharilyn Sites, MD  ? ?Chief Complaint: chest pain ? ?History of Present Illness:   ? ?Erika Ramos is a 76 y.o. female with a hx of CAD s/p PCI RCA/PTCA PDA in 2001, hyperlipidemia, breast cancer s/p lumpectomy followed by chemotherapy and radiation 2009, HTN, and diabetes.  ? ?She established care many years ago and has been seen in the past by Drs. Wall and McLean. She had a negative myoview in 03/2016 following a report of chest pain.  ? ?Her last office visit was with Dr. Johnsie Cancel on 08/26/21 at which time she reported substernal chest pain right and left lasting for a few seconds. Occurring most often at night and not with exertion. Due to history of CAD and diabetes, Dr. Johnsie Cancel ordered an exercise myoview for evaluation of her pain although he felt it was atypical. Myoview completed on 09/05/21 was reported as abnormal by Dr. Johnsie Cancel and she was advised to come in for an appointment to set up cardiac catheterization for next week.  ? ?I saw her on 09/06/21 for evaluation of chest pain and abnormal myocardial perfusion study. She was pain free at the visit. Reported short episodes of chest discomfort intermittently that occur on the left and right sides and in her back that last 1 to 2 seconds, not intensified by movement or activity. Not accompanied by n/v, diaphoresis. Does not exercise on a regular basis. Has been feeling fatigued for 3-4 months and resting more frequently. Noticing at times more episodes of chest discomfort when resting. She underwent cardiac catheterization on 09/10/21 which revealed severe diffuse heavy coronary calcification, 30% distal left main, mid 50%, mid 65%, and distal 85% LAD. A branch of first diagonal contains 90% stenosis, 30% proximal circumflex, segment of proximal  to mid 60% RCA, mid to distal PDA 90% stenosis, widely patent LAD stent with diffuse 30% narrowing, normal LV function and LVEDP. Current most severe lesions felt to be potentially approachable but would carry a higher than usual complication risk and in absence of convincing symptoms, no clinical utility in stenting.  ? ?Today, she is here alone for follow-up.  She reports she has been feeling well since cardiac catheterization. Continues to have some fatigue. She denies chest pain, shortness of breath, palpitations, melena, hematuria, hemoptysis, diaphoresis, weakness, presyncope, syncope, orthopnea, and PND. Had lower extremity edema yesterday after sitting and playing cards for several hours. Eats out frequently - about 16-17 times per week.  ? ?Past Medical History:  ?Diagnosis Date  ? Breast cancer ([place])   ? Breast cancer, stage 2, right ([place]) 09/06/2009  ? Qualifier: Diagnosis of  By: Ronne Binning    ? Coronary artery disease   ? Diabetes mellitus without complication ([place])   ? Fatty liver disease, nonalcoholic   ? GERD (gastroesophageal reflux disease)   ? Hypertension   ? PONV (postoperative nausea and vomiting)   ? Nausea/ Vomitting- last time 2010  ? Stented coronary artery   ? ? ?Past Surgical History:  ?Procedure Laterality Date  ? ABDOMINAL HYSTERECTOMY    ? BREAST SURGERY  March 2010  ? cataract  Right 09/2012  ? CATARACT EXTRACTION  01/01/2018  ? left eye  ? COLONOSCOPY WITH PROPOFOL N/A 07/05/2021  ? Procedure: COLONOSCOPY WITH PROPOFOL;  Surgeon: Eloise Harman, DO;  [facility];  Service: Endoscopy;  Laterality: N/A;  8:45am  ? FRACTURE SURGERY    ? wrist  right  ? LEFT HEART CATH AND CORONARY ANGIOGRAPHY N/A 09/10/2021  ? Procedure: LEFT HEART CATH AND CORONARY ANGIOGRAPHY;  Surgeon: Belva Crome, MD;  Location: [facility];  Service: Cardiovascular;  Laterality: N/A;  ? PARS PLANA VITRECTOMY  07/03/2011  ? Procedure: PARS PLANA VITRECTOMY WITH 25 GAUGE;  Surgeon: Hayden Pedro, MD;  Location: [place];  Service: Ophthalmology;  Laterality: Right;  MEMBRANE PEEL, HEAD SCOPE LASER, GAS  ? POLYPECTOMY  07/05/2021  ? Procedure: POLYPECTOMY;  Surgeon: Eloise Harman, DO;  [facility];  Service: Endoscopy;;  ? RETINAL DETACHMENT SURGERY    ? RETINAL DETACHMENT SURGERY  07/03/11  ? right  ? TONSILLECTOMY    ? ? ?Current Medications: ?Current Meds  ?Medication Sig  ? aspirin EC 81 MG tablet Take 81 mg by mouth daily.  ? Calcium-Vitamin D-Vitamin K 272-536-64 MG-UNT-MCG CHEW Chew 1 each by mouth daily.   ? Cyanocobalamin (VITAMIN B-12 PO) Take 1 tablet by mouth daily.  ? dapagliflozin propanediol (FARXIGA) 10 MG TABS tablet Take 10 mg by mouth daily.  ? escitalopram (LEXAPRO) 10 MG tablet Take 10 mg by mouth daily.  ? JANUMET 50-1000 MG tablet Take 1 tablet by mouth 2 (two) times daily with a meal.   ? mirabegron ER (MYRBETRIQ) 25 MG TB24 tablet Take 1 tablet (25 mg total) by mouth at bedtime.  ? Multiple Vitamins-Minerals (CENTRUM SILVER ULTRA WOMENS PO) Take 2 tablets by mouth daily.  ? Omega-3 Fatty Acids (FISH OIL PO) Take 1 capsule by mouth daily at 6 (six) AM. Per patient taking daily not taking at 6:00 am  ? omeprazole (PRILOSEC) 20 MG capsule Take 20 mg by mouth daily.  ? ramipril (ALTACE) 10 MG capsule Take 10 mg by mouth daily.  ? vitamin C (ASCORBIC ACID) 250 MG tablet Take 500 mg by mouth daily.  ? zinc gluconate 50 MG tablet Take 50 mg by mouth 3 (three) times a week.  ? [DISCONTINUED] carvedilol (COREG) 6.25 MG tablet Take 1 tablet (6.25 mg total) by mouth 2 (two) times daily with a meal. (Patient taking differently: Take 6.25 mg by mouth 2 (two) times daily with a meal. Per patient instructed to take 2 tablets twice a day)  ? [DISCONTINUED] simvastatin (ZOCOR) 40 MG tablet Take 40 mg by mouth daily. Per patient  ?  ? ?Allergies:   Clopidogrel bisulfate and Oxycontin [oxycodone hcl]  ? ?Social History  ? ?Socioeconomic History  ? Marital status: Divorced  ?  Spouse  name: Not on file  ? Number of children: Not on file  ? Years of education: Not on file  ? Highest education level: Not on file  ?Occupational History  ? Not on file  ?Tobacco Use  ? Smoking status: Never  ? Smokeless tobacco: Never  ?Vaping Use  ? Vaping Use: Never used  ?Substance and Sexual Activity  ? Alcohol use: No  ? Drug use: No  ? Sexual activity: Not on file  ?  Comment: single-2 grown children  ?Other Topics Concern  ? Not on file  ?Social History Narrative  ? Not on file  ? ?Social Determinants of Health  ? ?Financial Resource Strain: Not on file  ?Food Insecurity: Not on file  ?Transportation Needs: Not on file  ?Physical Activity: Not on file  ?  Stress: Not on file  ?Social Connections: Not on file  ?  ? ?Family History: ?The patient's family history includes Cancer in her brother; Diabetes in her brother, sister, and sister; Heart disease in her brother and mother; Ovarian cancer in her mother; Stroke in her father. There is no history of Anesthesia problems. ? ?ROS:   ?Please see the history of present illness.    ?+ occasional episodes of chest discomfort ?+ fatigue ?All other systems reviewed and are negative. ? ?Labs/Other Studies Reviewed:   ? ?The following studies were reviewed today: ? ?LHC 09/10/21 ? ?Severe diffuse heavy coronary calcification ?30% distal left main ?Mid 50%, mid 65%, and distal 85% LAD.  A branch of the first diagonal contains 90% stenosis. ?30% proximal circumflex ?Segment of proximal to mid 60% RCA.  Widely patent distal LAD stent with diffuse 30% narrowing.  Mid to distal PDA 90% stenosis. ?Normal LV function and LVEDP.  EF estimated to be 60%. ?  ?RECOMMENDATIONS: ?Aggressive preventive therapy with LDL lowering less than 55. ?Anti-ischemic therapy as required to control symptoms.  The current symptoms are not ischemia related. ?The current most severe lesions are potentially approachable but would carry a higher than usual complication risk and in the absence of  convincing symptoms, stenting does not have a clinical utility. ? ?Diagnostic ?Dominance: Right ?Intervention ? ? ?Exercise Myovue 09/05/21 ? ?  Findings are consistent with prior inferior/inferoapical myocardi

## 2021-10-29 ENCOUNTER — Ambulatory Visit: Payer: Medicare HMO | Admitting: Nurse Practitioner

## 2021-10-30 ENCOUNTER — Encounter: Payer: Self-pay | Admitting: Nurse Practitioner

## 2021-10-30 ENCOUNTER — Ambulatory Visit (INDEPENDENT_AMBULATORY_CARE_PROVIDER_SITE_OTHER): Payer: Medicare HMO | Admitting: Nurse Practitioner

## 2021-10-30 VITALS — BP 124/70 | HR 87 | Ht 65.0 in | Wt 177.0 lb

## 2021-10-30 DIAGNOSIS — E785 Hyperlipidemia, unspecified: Secondary | ICD-10-CM | POA: Diagnosis not present

## 2021-10-30 DIAGNOSIS — I10 Essential (primary) hypertension: Secondary | ICD-10-CM | POA: Diagnosis not present

## 2021-10-30 DIAGNOSIS — I2510 Atherosclerotic heart disease of native coronary artery without angina pectoris: Secondary | ICD-10-CM | POA: Diagnosis not present

## 2021-10-30 MED ORDER — ROSUVASTATIN CALCIUM 40 MG PO TABS: 40.0000 mg | ORAL_TABLET | Freq: Every day | ORAL | 3 refills | Status: DC

## 2021-10-30 MED ORDER — CARVEDILOL 12.5 MG PO TABS
12.5000 mg | ORAL_TABLET | Freq: Two times a day (BID) | ORAL | 3 refills | Status: DC
Start: 2021-10-30 — End: 2023-01-07

## 2021-10-30 NOTE — Patient Instructions (Signed)
Medication Instructions:  ? ?DISCONTINUE Simvastatin ? ?START Rosuvastatin one (1) tablet by mouth (40 mg) daily.  ? ?INCREASE Coreg one (1) tablet by mouth (12.5 mg) twice daily. ? ?*If you need a refill on your cardiac medications before your next appointment, please call your pharmacy* ? ? ?Lab Work: ? ?Your physician recommends that you return for a FASTING lipid profile around July 10 fasting from midnight the night before. At [facility].  Requisition given to patient today.  ? ?If you have labs (blood work) drawn today and your tests are completely normal, you will receive your results only by: ?MyChart Message (if you have MyChart) OR ?A paper copy in the mail ?If you have any lab test that is abnormal or we need to change your treatment, we will call you to review the results. ? ? ?Testing/Procedures: ? ?None ordered. ? ? ?Follow-Up: ?At [facility], you and your health needs are our priority.  As part of our continuing mission to provide you with exceptional heart care, we have created designated Provider Care Teams.  These Care Teams include your primary Cardiologist (physician) and Advanced Practice Providers (APPs -  Physician Assistants and Nurse Practitioners) who all work together to provide you with the care you need, when you need it. ? ?We recommend signing up for the patient portal called "MyChart".  Sign up information is provided on this After Visit Summary.  MyChart is used to connect with patients for Virtual Visits (Telemedicine).  Patients are able to view lab/test results, encounter notes, upcoming appointments, etc.  Non-urgent messages can be sent to your provider as well.   ?To learn more about what you can do with MyChart, go to [email or web address].   ? ?Your next appointment:   ?3 month(s) ? ?The format for your next appointment:   ?In Person ? ?Provider:   ?Jenkins Rouge, MD   ?Important Information About Sugar ? ? ? ? ?  ?

## 2021-11-05 DIAGNOSIS — E6609 Other obesity due to excess calories: Secondary | ICD-10-CM | POA: Diagnosis not present

## 2021-11-05 DIAGNOSIS — Z6830 Body mass index (BMI) 30.0-30.9, adult: Secondary | ICD-10-CM | POA: Diagnosis not present

## 2021-11-05 DIAGNOSIS — N390 Urinary tract infection, site not specified: Secondary | ICD-10-CM | POA: Diagnosis not present

## 2021-12-04 ENCOUNTER — Other Ambulatory Visit: Payer: Self-pay

## 2021-12-04 MED ORDER — MIRABEGRON ER 25 MG PO TB24: 25.0000 mg | ORAL_TABLET | Freq: Every day | ORAL | 11 refills | Status: DC

## 2021-12-30 ENCOUNTER — Other Ambulatory Visit (HOSPITAL_COMMUNITY)
Admission: RE | Admit: 2021-12-30 | Discharge: 2021-12-30 | Disposition: A | Discharge status: Home / Self Care | Payer: Medicare HMO | Source: Ambulatory Visit | Attending: Nurse Practitioner | Admitting: Nurse Practitioner

## 2021-12-30 DIAGNOSIS — I25118 Atherosclerotic heart disease of native coronary artery with other forms of angina pectoris: Secondary | ICD-10-CM | POA: Diagnosis not present

## 2021-12-30 LAB — LIPID PANEL
Cholesterol: 136 mg/dL (ref 0–200)
HDL: 40 mg/dL — ABNORMAL LOW (ref >40)
LDL Cholesterol: 47 mg/dL (ref 0–99)
Total CHOL/HDL Ratio: 3.4 RATIO
Triglycerides: 245 mg/dL — ABNORMAL HIGH (ref <150)
VLDL: 49 mg/dL — ABNORMAL HIGH (ref 0–40)

## 2021-12-30 LAB — COMPREHENSIVE METABOLIC PANEL
ALT: 31 U/L (ref 0–44)
AST: 35 U/L (ref 15–41)
Albumin: 4 g/dL (ref 3.5–5.0)
Alkaline Phosphatase: 63 U/L (ref 38–126)
Anion gap: 11 (ref 5–15)
BUN: 16 mg/dL (ref 8–23)
CO2: 24 mmol/L (ref 22–32)
Calcium: 9.3 mg/dL (ref 8.9–10.3)
Chloride: 101 mmol/L (ref 98–111)
Creatinine, Ser: 0.67 mg/dL (ref 0.44–1.00)
GFR, Estimated: >60 mL/min (ref >60)
Glucose, Bld: 266 mg/dL — ABNORMAL HIGH (ref 70–99)
Potassium: 3.8 mmol/L (ref 3.5–5.1)
Sodium: 136 mmol/L (ref 135–145)
Total Bilirubin: 1.2 mg/dL (ref 0.3–1.2)
Total Protein: 7.4 g/dL (ref 6.5–8.1)

## 2021-12-31 ENCOUNTER — Other Ambulatory Visit: Payer: Self-pay | Admitting: *Deleted

## 2021-12-31 MED ORDER — FENOFIBRATE 145 MG PO TABS: 145.0000 mg | ORAL_TABLET | Freq: Every day | ORAL | 3 refills | Status: DC

## 2022-01-22 DIAGNOSIS — E782 Mixed hyperlipidemia: Secondary | ICD-10-CM | POA: Diagnosis not present

## 2022-01-22 DIAGNOSIS — E559 Vitamin D deficiency, unspecified: Secondary | ICD-10-CM | POA: Diagnosis not present

## 2022-01-22 DIAGNOSIS — Z0001 Encounter for general adult medical examination with abnormal findings: Secondary | ICD-10-CM | POA: Diagnosis not present

## 2022-01-22 DIAGNOSIS — E118 Type 2 diabetes mellitus with unspecified complications: Secondary | ICD-10-CM | POA: Diagnosis not present

## 2022-01-22 DIAGNOSIS — I10 Essential (primary) hypertension: Secondary | ICD-10-CM | POA: Diagnosis not present

## 2022-01-22 DIAGNOSIS — Z1331 Encounter for screening for depression: Secondary | ICD-10-CM | POA: Diagnosis not present

## 2022-01-22 DIAGNOSIS — E663 Overweight: Secondary | ICD-10-CM | POA: Diagnosis not present

## 2022-01-22 DIAGNOSIS — Z6829 Body mass index (BMI) 29.0-29.9, adult: Secondary | ICD-10-CM | POA: Diagnosis not present

## 2022-01-27 DIAGNOSIS — N3941 Urge incontinence: Secondary | ICD-10-CM | POA: Diagnosis not present

## 2022-02-04 ENCOUNTER — Ambulatory Visit: Payer: Medicare HMO | Admitting: Cardiovascular Disease

## 2022-02-06 NOTE — Progress Notes (Signed)
Cardiology Office Note:    Date:  02/13/2022   ID:  [name], [name] [birth year], MRN [id number]  PCP:  Erika Sites, MD   [facility] Providers Cardiologist:  Erika Rouge, MD     Referring MD: Erika Sites, MD   Chief Complaint: chest pain  History of Present Illness:    Erika Ramos is a 76 y.o. female with a hx of CAD s/p PCI RCA/PTCA PDA in 2001, hyperlipidemia, breast cancer s/p lumpectomy followed by chemotherapy and radiation 2009, HTN, and diabetes.   08/26/21 at which time she reported substernal chest pain  Myoview completed on 09/05/21 was reported as abnormal  She underwent cardiac catheterization on 09/10/21 which revealed severe diffuse heavy coronary calcification, 30% distal left main, mid 50%, mid 65%, and distal 85% LAD. A branch of first diagonal contains 90% stenosis, 30% proximal circumflex, segment of proximal to mid 60% RCA, mid to distal PDA 90% stenosis, widely patent distal RCA stent with diffuse 30% narrowing, normal LV function and LVEDP. Current most severe lesions felt to be potentially approachable but would carry a higher than usual complication risk and in absence of convincing symptoms, no clinical utility in stenting.   Doing well on medical Rx with no classic angina Compliant with meds  Has atypical SSCP only at night when going to bed not with exertion Discussed taking pepcid before bed to see if it helps Taking Blood Sugar supplement to help with DM Needs f/u labs   Past Medical History:  Diagnosis Date   Breast cancer [facility])    Breast cancer, stage 2, right ([place]) 09/06/2009   Qualifier: Diagnosis of  By: Orville Govern, CMA, Carol     Coronary artery disease    Diabetes mellitus without complication ([place])    Fatty liver disease, nonalcoholic    GERD (gastroesophageal reflux disease)    Hypertension    PONV (postoperative nausea and vomiting)    Nausea/ Vomitting- last time 2010   Stented coronary artery     Past Surgical History:   Procedure Laterality Date   ABDOMINAL HYSTERECTOMY     BREAST SURGERY  March 2010   cataract  Right 09/2012   CATARACT EXTRACTION  01/01/2018   left eye   COLONOSCOPY WITH PROPOFOL N/A 07/05/2021   Procedure: COLONOSCOPY WITH PROPOFOL;  Surgeon: Eloise Harman, DO;  [facility];  Service: Endoscopy;  Laterality: N/A;  8:45am   FRACTURE SURGERY     wrist  right   LEFT HEART CATH AND CORONARY ANGIOGRAPHY N/A 09/10/2021   Procedure: LEFT HEART CATH AND CORONARY ANGIOGRAPHY;  Surgeon: Belva Crome, MD;  Location: [place] [facility];  Service: Cardiovascular;  Laterality: N/A;   PARS PLANA VITRECTOMY  07/03/2011   Procedure: PARS PLANA VITRECTOMY WITH 25 GAUGE;  Surgeon: Hayden Pedro, MD;  Location: [place];  Service: Ophthalmology;  Laterality: Right;  MEMBRANE PEEL, HEAD SCOPE LASER, GAS   POLYPECTOMY  07/05/2021   Procedure: POLYPECTOMY;  Surgeon: Eloise Harman, DO;  [facility]: [facility];  Service: Endoscopy;;   RETINAL DETACHMENT SURGERY     RETINAL DETACHMENT SURGERY  07/03/11   right   TONSILLECTOMY      Current Medications: Current Meds  Medication Sig   aspirin EC 81 MG tablet Take 81 mg by mouth daily.   Biotin 5000 MCG CHEW Chew by mouth daily. Per patient taking 2 chews daily   Calcium-Vitamin D-Vitamin K 834-196-22 MG-UNT-MCG CHEW Chew 1 each by mouth daily.  carvedilol (COREG) 12.5 MG tablet Take 1 tablet (12.5 mg total) by mouth 2 (two) times daily with a meal.   Cholecalciferol (VITAMIN D3 GUMMIES ADULT PO) Take 2,000 Units by mouth daily.   dapagliflozin propanediol (FARXIGA) 10 MG TABS tablet Take 10 mg by mouth daily.   escitalopram (LEXAPRO) 10 MG tablet Take 10 mg by mouth daily.   fenofibrate (TRICOR) 145 MG tablet Take 1 tablet (145 mg total) by mouth daily.   JANUMET 50-1000 MG tablet Take 1 tablet by mouth 2 (two) times daily with a meal.    mirabegron ER (MYRBETRIQ) 25 MG TB24 tablet Take 1 tablet (25 mg total) by mouth at bedtime.    Multiple Vitamins-Minerals (CENTRUM SILVER ULTRA WOMENS PO) Take 2 tablets by mouth daily.   nitroGLYCERIN (NITROSTAT) 0.4 MG SL tablet Place 1 tablet (0.4 mg total) under the tongue every 5 (five) minutes as needed for chest pain.   Omega-3 Fatty Acids (FISH OIL PO) Take 1 capsule by mouth daily at 6 (six) AM. Per patient taking daily not taking at 6:00 am   omeprazole (PRILOSEC) 20 MG capsule Take 20 mg by mouth daily.   ramipril (ALTACE) 10 MG capsule Take 10 mg by mouth daily.   rosuvastatin (CRESTOR) 40 MG tablet Take 1 tablet (40 mg total) by mouth daily.   vitamin C (ASCORBIC ACID) 250 MG tablet Take 500 mg by mouth daily.   zinc gluconate 50 MG tablet Take 50 mg by mouth 3 (three) times a week.     Allergies:   Clopidogrel bisulfate and Oxycontin [oxycodone hcl]   Social History   Socioeconomic History   Marital status: Divorced    Spouse name: Not on file   Number of children: Not on file   Years of education: Not on file   Highest education level: Not on file  Occupational History   Not on file  Tobacco Use   Smoking status: Never   Smokeless tobacco: Never  Vaping Use   Vaping Use: Never used  Substance and Sexual Activity   Alcohol use: No   Drug use: No   Sexual activity: Not on file    Comment: single-2 grown children  Other Topics Concern   Not on file  Social History Narrative   Not on file   Social Determinants of Health   Financial Resource Strain: Not on file  Food Insecurity: Not on file  Transportation Needs: Not on file  Physical Activity: Not on file  Stress: Not on file  Social Connections: Not on file     Family History: The patient's family history includes Cancer in her brother; Diabetes in her brother, sister, and sister; Heart disease in her brother and mother; Ovarian cancer in her mother; Stroke in her father. There is no history of Anesthesia problems.  ROS:   Please see the history of present illness.    + occasional episodes of  chest discomfort + fatigue All other systems reviewed and are negative.  Labs/Other Studies Reviewed:    The following studies were reviewed today:  LHC 09/10/21  Severe diffuse heavy coronary calcification 30% distal left main Mid 50%, mid 65%, and distal 85% LAD.  A branch of the first diagonal contains 90% stenosis. 30% proximal circumflex Segment of proximal to mid 60% RCA.  Widely patent distal LAD stent with diffuse 30% narrowing.  Mid to distal PDA 90% stenosis. Normal LV function and LVEDP.  EF estimated to be 60%.   RECOMMENDATIONS: Aggressive preventive therapy  with LDL lowering less than 55. Anti-ischemic therapy as required to control symptoms.  The current symptoms are not ischemia related. The current most severe lesions are potentially approachable but would carry a higher than usual complication risk and in the absence of convincing symptoms, stenting does not have a clinical utility.  Diagnostic Dominance: Right Intervention   Exercise Myovue 09/05/21    Findings are consistent with prior inferior/inferoapical myocardial infarction with mild to moderate peri-infarct ischemia. Low to intermediate risk   No ST deviation was noted.   LV perfusion is abnormal. Large moderate intensity inferior/inferoapical defect with mild to moderate reversibility.   Left ventricular function is normal. End diastolic cavity size is normal.    Exercise Myoview 03/2016  The patient exercised following the Bruce protocol.  The patient reported shortness of breath during the stress test. The patient experienced no angina during the stress test.   The patient requested the test to be stopped. The test was stopped because the patient complained of shortness of breath.   Heart rate demonstrated a normal response to exercise. Blood pressure demonstrated a hypertensive response to exercise. Overall, the patient's exercise capacity was normal.   85% of maximum heart rate was achieved  after 3.3 minutes. Recovery time: 7 minutes. The patient's response to exercise was adequate for diagnosis    Recent Labs: 09/06/2021: Hemoglobin 14.1; Platelets 210 12/30/2021: ALT 31; BUN 16; Creatinine, Ser 0.67; Potassium 3.8; Sodium 136  Recent Lipid Panel    Component Value Date/Time   CHOL 136 12/30/2021 1445   CHOL 181 10/28/2021 1130   TRIG 245 (H) 12/30/2021 1445   HDL 40 (L) 12/30/2021 1445   HDL 45 10/28/2021 1130   CHOLHDL 3.4 12/30/2021 1445   VLDL 49 (H) 12/30/2021 1445   LDLCALC 47 12/30/2021 1445   LDLCALC 106 (H) 10/28/2021 1130     Risk Assessment/Calculations:       Physical Exam:    VS:  BP (!) 140/90   Pulse 77   Ht '5\' 5"'$  (1.651 m)   Wt 175 lb (79.4 kg)   SpO2 97%   BMI 29.12 kg/m     Wt Readings from Last 3 Encounters:  02/13/22 175 lb (79.4 kg)  10/30/21 177 lb (80.3 kg)  09/10/21 176 lb (79.8 kg)    Affect appropriate Healthy:  appears stated age HEENT: normal Neck supple with no adenopathy JVP normal no bruits no thyromegaly Lungs clear with no wheezing and good diaphragmatic motion Heart:  S1/S2 no murmur, no rub, gallop or click PMI normal Abdomen: benighn, BS positve, no tenderness, no AAA no bruit.  No HSM or HJR Distal pulses intact with no bruits No edema Neuro non-focal Skin warm and dry No muscular weakness   EKG:   SR rate 91 LAD poor R wave progression   Diagnoses:    No diagnosis found.   Assessment and Plan:     CAD native without angina: Underwent LHC 09/10/21 recommended medical Rx Review of angiogram shows tightest lesions in small D1 branch and distal LAD. Moderate proximal RCA dx with patent distal RCA stent Continue coreg, ASA and statin Coreg dose increased post cath Did not take am meds for labs this morning so BP/HR a bit high She will monitor at home  .  Essential hypertension: Well controlled.  Continue current medications and low sodium Dash type diet.    Hyperlipidemia LDL goal < 70: LDL 106 10/28/21.  Zocor changed to crestor LDL now at goal 47 on  12/30/21   Disposition: F/U 6 months    Medication Adjustments/Labs and Tests Ordered: Current medicines are reviewed at length with the patient today.  Concerns regarding medicines are outlined above.  No orders of the defined types were placed in this encounter.  No orders of the defined types were placed in this encounter.   There are no Patient Instructions on file for this visit.   Signed, Erika Rouge, MD  02/13/2022 10:00 AM    [place]

## 2022-02-13 ENCOUNTER — Encounter: Payer: Self-pay | Admitting: Cardiovascular Disease

## 2022-02-13 ENCOUNTER — Ambulatory Visit (INDEPENDENT_AMBULATORY_CARE_PROVIDER_SITE_OTHER): Payer: Medicare HMO | Admitting: Cardiovascular Disease

## 2022-02-13 VITALS — BP 140/90 | HR 77 | Ht 65.0 in | Wt 175.0 lb

## 2022-02-13 DIAGNOSIS — E119 Type 2 diabetes mellitus without complications: Secondary | ICD-10-CM | POA: Diagnosis not present

## 2022-02-13 DIAGNOSIS — E785 Hyperlipidemia, unspecified: Secondary | ICD-10-CM

## 2022-02-13 DIAGNOSIS — Z79899 Other long term (current) drug therapy: Secondary | ICD-10-CM

## 2022-02-13 MED ORDER — NITROGLYCERIN 0.4 MG SL SUBL: 0.4000 mg | SUBLINGUAL_TABLET | SUBLINGUAL | 3 refills | Status: DC | PRN

## 2022-02-13 NOTE — Patient Instructions (Addendum)
Medication Instructions:  Your physician recommends that you continue on your current medications as directed. Please refer to the Current Medication list given to you today.  *If you need a refill on your cardiac medications before your next appointment, please call your pharmacy*  Lab Work: Your physician recommends that you have lab work today- Lipid and Liver Panel, and HgbA1c  If you have labs (blood work) drawn today and your tests are completely normal, you will receive your results only by: MyChart Message (if you have MyChart) OR A paper copy in the mail If you have any lab test that is abnormal or we need to change your treatment, we will call you to review the results.  Testing/Procedures: None ordered today.  Follow-Up: At [facility], you and your health needs are our priority.  As part of our continuing mission to provide you with exceptional heart care, we have created designated Provider Care Teams.  These Care Teams include your primary Cardiologist (physician) and Advanced Practice Providers (APPs -  Physician Assistants and Nurse Practitioners) who all work together to provide you with the care you need, when you need it.  We recommend signing up for the patient portal called "MyChart".  Sign up information is provided on this After Visit Summary.  MyChart is used to connect with patients for Virtual Visits (Telemedicine).  Patients are able to view lab/test results, encounter notes, upcoming appointments, etc.  Non-urgent messages can be sent to your provider as well.   To learn more about what you can do with MyChart, go to [email or web address].    Your next appointment:   6 month(s)  The format for your next appointment:   In Person  Provider:   Jenkins Rouge, MD {   Important Information About Sugar

## 2022-02-14 LAB — HEPATIC FUNCTION PANEL
ALT: 27 IU/L (ref 0–32)
AST: 36 IU/L (ref 0–40)
Albumin: 4.6 g/dL (ref 3.8–4.8)
Alkaline Phosphatase: 60 IU/L (ref 44–121)
Bilirubin Total: 0.3 mg/dL (ref 0.0–1.2)
Bilirubin, Direct: 0.12 mg/dL (ref 0.00–0.40)
Total Protein: 6.7 g/dL (ref 6.0–8.5)

## 2022-02-14 LAB — LIPID PANEL
Chol/HDL Ratio: 3.2 ratio (ref 0.0–4.4)
Cholesterol, Total: 129 mg/dL (ref 100–199)
HDL: 40 mg/dL (ref >39)
LDL Chol Calc (NIH): 63 mg/dL (ref 0–99)
Triglycerides: 152 mg/dL — ABNORMAL HIGH (ref 0–149)
VLDL Cholesterol Cal: 26 mg/dL (ref 5–40)

## 2022-02-14 LAB — HEMOGLOBIN A1C
Est. average glucose Bld gHb Est-mCnc: 278 mg/dL
Hgb A1c MFr Bld: 11.3 % — ABNORMAL HIGH (ref 4.8–5.6)

## 2022-03-16 ENCOUNTER — Encounter (HOSPITAL_COMMUNITY): Payer: Self-pay | Admitting: Emergency Medicine

## 2022-03-16 ENCOUNTER — Emergency Department (HOSPITAL_COMMUNITY): Payer: Medicare HMO

## 2022-03-16 ENCOUNTER — Other Ambulatory Visit: Payer: Self-pay

## 2022-03-16 ENCOUNTER — Emergency Department (HOSPITAL_COMMUNITY)
Admission: EM | Admit: 2022-03-16 | Discharge: 2022-03-17 | Disposition: A | Discharge status: Home / Self Care | Payer: Medicare HMO | Attending: Emergency Medicine | Admitting: Emergency Medicine

## 2022-03-16 DIAGNOSIS — G319 Degenerative disease of nervous system, unspecified: Secondary | ICD-10-CM | POA: Diagnosis not present

## 2022-03-16 DIAGNOSIS — Z79899 Other long term (current) drug therapy: Secondary | ICD-10-CM | POA: Insufficient documentation

## 2022-03-16 DIAGNOSIS — Z20822 Contact with and (suspected) exposure to covid-19: Secondary | ICD-10-CM | POA: Insufficient documentation

## 2022-03-16 DIAGNOSIS — R9431 Abnormal electrocardiogram [ECG] [EKG]: Secondary | ICD-10-CM | POA: Diagnosis not present

## 2022-03-16 DIAGNOSIS — S52124A Nondisplaced fracture of head of right radius, initial encounter for closed fracture: Secondary | ICD-10-CM | POA: Diagnosis not present

## 2022-03-16 DIAGNOSIS — S52134A Nondisplaced fracture of neck of right radius, initial encounter for closed fracture: Secondary | ICD-10-CM | POA: Diagnosis not present

## 2022-03-16 DIAGNOSIS — M25421 Effusion, right elbow: Secondary | ICD-10-CM | POA: Diagnosis not present

## 2022-03-16 DIAGNOSIS — S6991XA Unspecified injury of right wrist, hand and finger(s), initial encounter: Secondary | ICD-10-CM | POA: Diagnosis not present

## 2022-03-16 DIAGNOSIS — Z7982 Long term (current) use of aspirin: Secondary | ICD-10-CM | POA: Diagnosis not present

## 2022-03-16 DIAGNOSIS — R0781 Pleurodynia: Secondary | ICD-10-CM | POA: Insufficient documentation

## 2022-03-16 DIAGNOSIS — S199XXA Unspecified injury of neck, initial encounter: Secondary | ICD-10-CM | POA: Diagnosis not present

## 2022-03-16 DIAGNOSIS — I6523 Occlusion and stenosis of bilateral carotid arteries: Secondary | ICD-10-CM | POA: Diagnosis not present

## 2022-03-16 DIAGNOSIS — I6782 Cerebral ischemia: Secondary | ICD-10-CM | POA: Diagnosis not present

## 2022-03-16 DIAGNOSIS — R29810 Facial weakness: Secondary | ICD-10-CM | POA: Insufficient documentation

## 2022-03-16 DIAGNOSIS — S0083XA Contusion of other part of head, initial encounter: Secondary | ICD-10-CM | POA: Diagnosis not present

## 2022-03-16 DIAGNOSIS — S0993XA Unspecified injury of face, initial encounter: Secondary | ICD-10-CM | POA: Diagnosis not present

## 2022-03-16 DIAGNOSIS — J9811 Atelectasis: Secondary | ICD-10-CM | POA: Diagnosis not present

## 2022-03-16 DIAGNOSIS — W19XXXA Unspecified fall, initial encounter: Secondary | ICD-10-CM | POA: Insufficient documentation

## 2022-03-16 LAB — DIFFERENTIAL
Abs Immature Granulocytes: 0.05 10*3/uL (ref 0.00–0.07)
Basophils Absolute: 0 10*3/uL (ref 0.0–0.1)
Basophils Relative: 0 %
Eosinophils Absolute: 0.1 10*3/uL (ref 0.0–0.5)
Eosinophils Relative: 1 %
Immature Granulocytes: 1 %
Lymphocytes Relative: 29 %
Lymphs Abs: 2.5 10*3/uL (ref 0.7–4.0)
Monocytes Absolute: 0.7 10*3/uL (ref 0.1–1.0)
Monocytes Relative: 8 %
Neutro Abs: 5.3 10*3/uL (ref 1.7–7.7)
Neutrophils Relative %: 61 %

## 2022-03-16 LAB — ETHANOL: Alcohol, Ethyl (B): <10 mg/dL (ref <10)

## 2022-03-16 LAB — I-STAT CHEM 8, ED
BUN: 18 mg/dL (ref 8–23)
Calcium, Ion: 1.1 mmol/L — ABNORMAL LOW (ref 1.15–1.40)
Chloride: 103 mmol/L (ref 98–111)
Creatinine, Ser: 0.6 mg/dL (ref 0.44–1.00)
Glucose, Bld: 286 mg/dL — ABNORMAL HIGH (ref 70–99)
HCT: 39 % (ref 36.0–46.0)
Hemoglobin: 13.3 g/dL (ref 12.0–15.0)
Potassium: 4 mmol/L (ref 3.5–5.1)
Sodium: 135 mmol/L (ref 135–145)
TCO2: 22 mmol/L (ref 22–32)

## 2022-03-16 LAB — COMPREHENSIVE METABOLIC PANEL
ALT: 37 U/L (ref 0–44)
AST: 62 U/L — ABNORMAL HIGH (ref 15–41)
Albumin: 3.9 g/dL (ref 3.5–5.0)
Alkaline Phosphatase: 55 U/L (ref 38–126)
Anion gap: 14 (ref 5–15)
BUN: 17 mg/dL (ref 8–23)
CO2: 19 mmol/L — ABNORMAL LOW (ref 22–32)
Calcium: 9.3 mg/dL (ref 8.9–10.3)
Chloride: 101 mmol/L (ref 98–111)
Creatinine, Ser: 0.81 mg/dL (ref 0.44–1.00)
GFR, Estimated: >60 mL/min (ref >60)
Glucose, Bld: 284 mg/dL — ABNORMAL HIGH (ref 70–99)
Potassium: 4 mmol/L (ref 3.5–5.1)
Sodium: 134 mmol/L — ABNORMAL LOW (ref 135–145)
Total Bilirubin: 0.3 mg/dL (ref 0.3–1.2)
Total Protein: 6.7 g/dL (ref 6.5–8.1)

## 2022-03-16 LAB — CBC
HCT: 40 % (ref 36.0–46.0)
Hemoglobin: 12.5 g/dL (ref 12.0–15.0)
MCH: 27 pg (ref 26.0–34.0)
MCHC: 31.3 g/dL (ref 30.0–36.0)
MCV: 86.4 fL (ref 80.0–100.0)
Platelets: 207 10*3/uL (ref 150–400)
RBC: 4.63 MIL/uL (ref 3.87–5.11)
RDW: 13.2 % (ref 11.5–15.5)
WBC: 8.7 10*3/uL (ref 4.0–10.5)
nRBC: 0 % (ref 0.0–0.2)

## 2022-03-16 LAB — RESP PANEL BY RT-PCR (FLU A&B, COVID) ARPGX2
Influenza A by PCR: NEGATIVE
Influenza B by PCR: NEGATIVE
SARS Coronavirus 2 by RT PCR: NEGATIVE

## 2022-03-16 LAB — CBG MONITORING, ED: Glucose-Capillary: 237 mg/dL — ABNORMAL HIGH (ref 70–99)

## 2022-03-16 LAB — APTT: aPTT: 28 seconds (ref 24–36)

## 2022-03-16 LAB — PROTIME-INR
INR: 1.1 (ref 0.8–1.2)
Prothrombin Time: 14.3 seconds (ref 11.4–15.2)

## 2022-03-16 MED ORDER — ACETAMINOPHEN 500 MG PO TABS
1000.0000 mg | ORAL_TABLET | Freq: Once | ORAL | Status: AC
  Administered 2022-03-16: 1000 mg via ORAL
  Filled 2022-03-16: qty 2

## 2022-03-16 NOTE — Discharge Instructions (Signed)
Follow up with ortho in the office.  Give them a call in the morning to set up an appointment.

## 2022-03-16 NOTE — ED Triage Notes (Addendum)
Per Pt, she had a mechanical all at 2pm, hitting the left side of her face, pt did not have LOC.  She how has bruising to her left eye and left chin.  There is new drooping to left side, hx of bells (22 years ago.)  Reports no blood thinners.  Pt refused EMS at time of fall.  Pt is alert and oriented in triage.  Pain to right elbow area.  No other neuro deficits in triage.  Provider to triage to assess.

## 2022-03-16 NOTE — ED Notes (Signed)
Patient transported to MRI 

## 2022-03-16 NOTE — ED Provider Notes (Signed)
[facility] EMERGENCY DEPARTMENT Provider Note   CSN: [id number] Arrival date & time: 03/16/22  1920     History  Chief Complaint  Patient presents with   Erika Ramos is a 76 y.o. female.  76 yo F with a chief complaint of a fall.  She is not sure exactly why she fell.  She was walking down the street and she thinks she stepped over a crack in the road and then 2 steps later landed on the ground.  After which she felt she had trouble seeing out of the left side of her visual field and bystanders noted that she had some left-sided facial droop.  She thinks that this has resolved though the family thinks that her left side of her face is still drooped compared to the right.  She otherwise denied any numbness or weakness denies difficulty with speech or swallowing.  No history of stroke.  Remote history of Bell's palsy.   Fall       Home Medications Prior to Admission medications   Medication Sig Start Date End Date Taking? Authorizing Provider  aspirin EC 81 MG tablet Take 81 mg by mouth daily.    [provider]  Biotin 5000 MCG CHEW Chew by mouth daily. Per patient taking 2 chews daily    [provider]  Calcium-Vitamin D-Vitamin K [id number] MG-UNT-MCG CHEW Chew 1 each by mouth daily.     [provider]  carvedilol (COREG) 12.5 MG tablet Take 1 tablet (12.5 mg total) by mouth 2 (two) times daily with a meal. 10/30/21 10/31/22  Swinyer, Lanice Schwab, NP  Cholecalciferol (VITAMIN D3 GUMMIES ADULT PO) Take 2,000 Units by mouth daily.    [provider]  Cyanocobalamin (VITAMIN B-12 PO) Take 1 tablet by mouth daily. Patient not taking: Reported on 02/13/2022    [provider]  dapagliflozin propanediol (FARXIGA) 10 MG TABS tablet Take 10 mg by mouth daily.    [provider]  escitalopram (LEXAPRO) 10 MG tablet Take 10 mg by mouth daily. 07/11/20   [provider]  fenofibrate (TRICOR) 145  MG tablet Take 1 tablet (145 mg total) by mouth daily. 12/31/21 01/01/23  Swinyer, Lanice Schwab, NP  JANUMET 50-1000 MG tablet Take 1 tablet by mouth 2 (two) times daily with a meal.  05/19/16   [provider]  mirabegron ER (MYRBETRIQ) 25 MG TB24 tablet Take 1 tablet (25 mg total) by mouth at bedtime. 12/04/21   McKenzie, Candee Furbish, MD  Multiple Vitamins-Minerals (CENTRUM SILVER ULTRA WOMENS PO) Take 2 tablets by mouth daily.    [provider]  nitroGLYCERIN (NITROSTAT) 0.4 MG SL tablet Place 1 tablet (0.4 mg total) under the tongue every 5 (five) minutes as needed for chest pain. 02/13/22 05/14/22  Josue Hector, MD  Omega-3 Fatty Acids (FISH OIL PO) Take 1 capsule by mouth daily at 6 (six) AM. Per patient taking daily not taking at 6:00 am    [provider]  omeprazole (PRILOSEC) 20 MG capsule Take 20 mg by mouth daily.    [provider]  ramipril (ALTACE) 10 MG capsule Take 10 mg by mouth daily.    [provider]  rosuvastatin (CRESTOR) 40 MG tablet Take 1 tablet (40 mg total) by mouth daily. 10/30/21   Swinyer, Lanice Schwab, NP  vitamin C (ASCORBIC ACID) 250 MG tablet Take 500 mg by mouth daily.    [provider]  zinc gluconate  50 MG tablet Take 50 mg by mouth 3 (three) times a week.    [provider]      Allergies    Clopidogrel bisulfate and Oxycontin [oxycodone hcl]    Review of Systems   Review of Systems  Physical Exam Updated Vital Signs BP (!) 174/85   Pulse 72   Temp 98.4 F (36.9 C) (Oral)   Resp 16   Ht '5\' 5"'$  (1.651 m)   Wt 78 kg   SpO2 98%   BMI 28.62 kg/m  Physical Exam Vitals and nursing note reviewed.  Constitutional:      General: She is not in acute distress.    Appearance: She is well-developed. She is not diaphoretic.  HENT:     Head: Normocephalic.     Comments: Bruising noted to the left side of the face with some bruising about the nose.  Left-sided facial droop. Eyes:     Pupils: Pupils  are equal, round, and reactive to light.  Cardiovascular:     Rate and Rhythm: Normal rate and regular rhythm.     Heart sounds: No murmur heard.    No friction rub. No gallop.  Pulmonary:     Effort: Pulmonary effort is normal.     Breath sounds: No wheezing or rales.  Abdominal:     General: There is no distension.     Palpations: Abdomen is soft.     Tenderness: There is no abdominal tenderness.  Musculoskeletal:        General: Tenderness present.     Cervical back: Normal range of motion and neck supple.     Comments: Pain about the right elbow especially at the attachment of the radial head to the elbow.  Has some pain with extension of the thumb otherwise has intact pulse motor and sensation.  Able to dorsiflex the wrist without issue.  No pain at the shoulder or the humerus.  Palpated from head to toe without any other noted areas of bony tenderness.  No midline C T or L-spine tenderness step-offs or deformities.  Able to rotate her head 45 degrees in either direction without midline pain.  Skin:    General: Skin is warm and dry.  Neurological:     Mental Status: She is alert and oriented to person, place, and time.  Psychiatric:        Behavior: Behavior normal.     ED Results / Procedures / Treatments   Labs (all labs ordered are listed, but only abnormal results are displayed) Labs Reviewed  COMPREHENSIVE METABOLIC PANEL - Abnormal; Notable for the following components:      Result Value   Sodium 134 (*)    CO2 19 (*)    Glucose, Bld 284 (*)    AST 62 (*)    All other components within normal limits  I-STAT CHEM 8, ED - Abnormal; Notable for the following components:   Glucose, Bld 286 (*)    Calcium, Ion 1.10 (*)    All other components within normal limits  CBG MONITORING, ED - Abnormal; Notable for the following components:   Glucose-Capillary 237 (*)    All other components within normal limits  RESP PANEL BY RT-PCR (FLU A&B, COVID) ARPGX2  ETHANOL   PROTIME-INR  APTT  CBC  DIFFERENTIAL  RAPID URINE DRUG SCREEN, HOSP PERFORMED  URINALYSIS, ROUTINE W REFLEX MICROSCOPIC    EKG EKG Interpretation  Date/Time:  Sunday March 16 2022 19:50:35 EDT Ventricular Rate:  90 PR  Interval:  144 QRS Duration: 86 QT Interval:  392 QTC Calculation: 479 R Axis:   -53 Text Interpretation: Normal sinus rhythm Left anterior fascicular block Cannot rule out Anterior infarct , age undetermined Abnormal ECG No significant change since last tracing Confirmed by Deno Etienne [id number]) on 03/16/2022 9:23:27 PM  Radiology DG Ribs Unilateral W/Chest Left  Result Date: 03/16/2022 CLINICAL DATA:  Chest pain status post mechanical fall EXAM: LEFT RIBS AND CHEST - 3+ VIEW COMPARISON:  05/15/2021 FINDINGS: The area of symptomatic concern as indicated by the patient was denoted with a metallic skin BB by the technologist. No displaced rib fracture. No pneumothorax. Left basilar atelectasis. Small left pleural effusion or pleural thickening. Stable cardiac silhouette. The right lung is clear. Surgical clips right chest wall. IMPRESSION: No displaced rib fractures. Small left pleural effusion or pleural thickening. Electronically Signed   By: Placido Sou M.D.   On: 03/16/2022 22:39   CT CERVICAL SPINE WO CONTRAST  Result Date: 03/16/2022 CLINICAL DATA:  Head trauma, minor (Age >= 65y) Neuro deficit, acute, stroke suspected; Facial trauma, blunt; Neck trauma (Age >= 65y). Mechanical fall at 2pm, hitting the left side of her face, pt did not have LOC. She how has bruising to her left eye and left chin. EXAM: CT HEAD WITHOUT CONTRAST CT MAXILLOFACIAL WITHOUT CONTRAST CT CERVICAL SPINE WITHOUT CONTRAST TECHNIQUE: Multidetector CT imaging of the head, cervical spine, and maxillofacial structures were performed using the standard protocol without intravenous contrast. Multiplanar CT image reconstructions of the cervical spine and maxillofacial structures were also generated.  RADIATION DOSE REDUCTION: This exam was performed according to the departmental dose-optimization program which includes automated exposure control, adjustment of the mA and/or kV according to patient size and/or use of iterative reconstruction technique. COMPARISON:  CT head 07/18/2017, ultrasound thyroid 02/19/2006 FINDINGS: CT HEAD FINDINGS BRAIN: BRAIN Patchy and confluent areas of decreased attenuation are noted throughout the deep and periventricular white matter of the cerebral hemispheres bilaterally, compatible with chronic microvascular ischemic disease. No evidence of large-territorial acute infarction. No parenchymal hemorrhage. No mass lesion. No extra-axial collection. No mass effect or midline shift. No hydrocephalus. Basilar cisterns are patent. Vascular: No hyperdense vessel. Atherosclerotic calcifications are present within the cavernous internal carotid arteries. Skull: No acute fracture or focal lesion. Other: None. CT MAXILLOFACIAL FINDINGS Osseous: Hyperostosis frontalis interna. No fracture or mandibular dislocation. No destructive process. Sinuses/Orbits: Redemonstration of polypoid like mucosal thickening of the left maxillary sinus. Otherwise paranasal sinuses and mastoid air cells are clear. Bilateral lens replacement. Otherwise the orbits are unremarkable. Soft tissues: Negative. CT CERVICAL SPINE FINDINGS Alignment: Normal. Skull base and vertebrae: Multilevel at least moderate degenerative changes spine. No associated severe osseous central canal or neural foraminal stenosis. No acute fracture. No aggressive appearing focal osseous lesion or focal pathologic process. Soft tissues and spinal canal: No prevertebral fluid or swelling. No visible canal hematoma. Upper chest: Unremarkable. Other: Bilateral enlarged and nodular thyroid glands. Largest hypodense nodules measures 1.5 cm (5:72). IMPRESSION: 1. No acute intracranial abnormality. 2. No acute displaced facial fracture. 3. No acute  displaced fracture or traumatic listhesis of the cervical spine. 4. 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 This has been evaluated on previous imaging in 2007. (ref: J Am Coll Radiol. 2015 Feb;12(2): 143-50). Consider nonemergent repeat imaging if clinically indicated. Electronically Signed   By: Iven Finn M.D.   On: 03/16/2022 21:28   CT HEAD WO CONTRAST  Result Date: 03/16/2022 CLINICAL DATA:  Head trauma, minor (Age >= 65y) Neuro deficit, acute, stroke suspected; Facial trauma, blunt; Neck trauma (Age >= 65y). Mechanical fall at 2pm, hitting the left side of her face, pt did not have LOC. She how has bruising to her left eye and left chin. EXAM: CT HEAD WITHOUT CONTRAST CT MAXILLOFACIAL WITHOUT CONTRAST CT CERVICAL SPINE WITHOUT CONTRAST TECHNIQUE: Multidetector CT imaging of the head, cervical spine, and maxillofacial structures were performed using the standard protocol without intravenous contrast. Multiplanar CT image reconstructions of the cervical spine and maxillofacial structures were also generated. RADIATION DOSE REDUCTION: This exam was performed according to the departmental dose-optimization program which includes automated exposure control, adjustment of the mA and/or kV according to patient size and/or use of iterative reconstruction technique. COMPARISON:  CT head 07/18/2017, ultrasound thyroid 02/19/2006 FINDINGS: CT HEAD FINDINGS BRAIN: BRAIN Patchy and confluent areas of decreased attenuation are noted throughout the deep and periventricular white matter of the cerebral hemispheres bilaterally, compatible with chronic microvascular ischemic disease. No evidence of large-territorial acute infarction. No parenchymal hemorrhage. No mass lesion. No extra-axial collection. No mass effect or midline shift. No hydrocephalus. Basilar cisterns are patent. Vascular: No hyperdense vessel. Atherosclerotic calcifications are present within the cavernous internal  carotid arteries. Skull: No acute fracture or focal lesion. Other: None. CT MAXILLOFACIAL FINDINGS Osseous: Hyperostosis frontalis interna. No fracture or mandibular dislocation. No destructive process. Sinuses/Orbits: Redemonstration of polypoid like mucosal thickening of the left maxillary sinus. Otherwise paranasal sinuses and mastoid air cells are clear. Bilateral lens replacement. Otherwise the orbits are unremarkable. Soft tissues: Negative. CT CERVICAL SPINE FINDINGS Alignment: Normal. Skull base and vertebrae: Multilevel at least moderate degenerative changes spine. No associated severe osseous central canal or neural foraminal stenosis. No acute fracture. No aggressive appearing focal osseous lesion or focal pathologic process. Soft tissues and spinal canal: No prevertebral fluid or swelling. No visible canal hematoma. Upper chest: Unremarkable. Other: Bilateral enlarged and nodular thyroid glands. Largest hypodense nodules measures 1.5 cm (5:72). IMPRESSION: 1. No acute intracranial abnormality. 2. No acute displaced facial fracture. 3. No acute displaced fracture or traumatic listhesis of the cervical spine. 4. 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 This has been evaluated on previous imaging in 2007. (ref: J Am Coll Radiol. 2015 Feb;12(2): 143-50). Consider nonemergent repeat imaging if clinically indicated. Electronically Signed   By: Iven Finn M.D.   On: 03/16/2022 21:28   CT Maxillofacial WO CM  Result Date: 03/16/2022 CLINICAL DATA:  Head trauma, minor (Age >= 65y) Neuro deficit, acute, stroke suspected; Facial trauma, blunt; Neck trauma (Age >= 65y). Mechanical fall at 2pm, hitting the left side of her face, pt did not have LOC. She how has bruising to her left eye and left chin. EXAM: CT HEAD WITHOUT CONTRAST CT MAXILLOFACIAL WITHOUT CONTRAST CT CERVICAL SPINE WITHOUT CONTRAST TECHNIQUE: Multidetector CT imaging of the head, cervical spine, and  maxillofacial structures were performed using the standard protocol without intravenous contrast. Multiplanar CT image reconstructions of the cervical spine and maxillofacial structures were also generated. RADIATION DOSE REDUCTION: This exam was performed according to the departmental dose-optimization program which includes automated exposure control, adjustment of the mA and/or kV according to patient size and/or use of iterative reconstruction technique. COMPARISON:  CT head 07/18/2017, ultrasound thyroid 02/19/2006 FINDINGS: CT HEAD FINDINGS BRAIN: BRAIN Patchy and confluent areas of decreased attenuation are noted throughout the deep and periventricular white matter of the cerebral hemispheres bilaterally, compatible with chronic microvascular ischemic disease. No evidence of  large-territorial acute infarction. No parenchymal hemorrhage. No mass lesion. No extra-axial collection. No mass effect or midline shift. No hydrocephalus. Basilar cisterns are patent. Vascular: No hyperdense vessel. Atherosclerotic calcifications are present within the cavernous internal carotid arteries. Skull: No acute fracture or focal lesion. Other: None. CT MAXILLOFACIAL FINDINGS Osseous: Hyperostosis frontalis interna. No fracture or mandibular dislocation. No destructive process. Sinuses/Orbits: Redemonstration of polypoid like mucosal thickening of the left maxillary sinus. Otherwise paranasal sinuses and mastoid air cells are clear. Bilateral lens replacement. Otherwise the orbits are unremarkable. Soft tissues: Negative. CT CERVICAL SPINE FINDINGS Alignment: Normal. Skull base and vertebrae: Multilevel at least moderate degenerative changes spine. No associated severe osseous central canal or neural foraminal stenosis. No acute fracture. No aggressive appearing focal osseous lesion or focal pathologic process. Soft tissues and spinal canal: No prevertebral fluid or swelling. No visible canal hematoma. Upper chest:  Unremarkable. Other: Bilateral enlarged and nodular thyroid glands. Largest hypodense nodules measures 1.5 cm (5:72). IMPRESSION: 1. No acute intracranial abnormality. 2. No acute displaced facial fracture. 3. No acute displaced fracture or traumatic listhesis of the cervical spine. 4. 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 This has been evaluated on previous imaging in 2007. (ref: J Am Coll Radiol. 2015 Feb;12(2): 143-50). Consider nonemergent repeat imaging if clinically indicated. Electronically Signed   By: Iven Finn M.D.   On: 03/16/2022 21:28   DG Wrist Complete Right  Result Date: 03/16/2022 CLINICAL DATA:  Trauma, fall EXAM: RIGHT WRIST - COMPLETE 3+ VIEW COMPARISON:  None Available. FINDINGS: No recent fracture or dislocation is seen. There is mild deformity in distal radius, possibly residual from previous injury. There is smooth marginated calcification in the region of ulnar styloid suggesting old avulsion. Degenerative changes are noted in intercarpal and carpometacarpal joints along the lateral aspect of the wrist. Arterial calcifications are seen in soft tissues. IMPRESSION: No recent fracture or dislocation is seen right wrist. Other findings as described in the body of the report. Electronically Signed   By: Elmer Picker M.D.   On: 03/16/2022 20:26   DG Elbow Complete Right  Result Date: 03/16/2022 CLINICAL DATA:  Trauma, fall EXAM: RIGHT ELBOW - COMPLETE 3+ VIEW COMPARISON:  None Available. FINDINGS: There is minimal cortical irregularity in the neck of radius. There is small calcific density at the tip of coronoid process of proximal ulna. There is displacement of posterior fat pad suggesting effusion. IMPRESSION: There is undisplaced fracture in the subcapital portion of neck cough proximal right radius. There is small linear calcific density adjacent to the tip of coronoid process of proximal ulna suggesting recent or old avulsion. Effusion  is present in the right elbow joint. Electronically Signed   By: Elmer Picker M.D.   On: 03/16/2022 20:25    Procedures Procedures    Medications Ordered in ED Medications  acetaminophen (TYLENOL) tablet 1,000 mg (1,000 mg Oral Given 03/16/22 2157)    ED Course/ Medical Decision Making/ A&P                           Medical Decision Making Amount and/or Complexity of Data Reviewed Radiology: ordered.  Risk OTC drugs.   76 yo F with a chief complaints of a fall.  She is not sure exactly why she fell.  She was walking down the street and she thinks she stepped over a crack in the road and then 2 steps later landed on the ground.  After which she felt she had trouble seeing out of the left side of her visual field and bystanders noted that she had some left-sided facial droop.  She thinks that this has resolved though the family thinks that her left side of her face is still drooped compared to the right.  She otherwise denied any numbness or weakness denies difficulty with speech or swallowing.  No history of stroke.  Remote history of Bell's palsy.  She had a CT of the head face and C-spine that were unremarkable though on my independent interpretation she appears to have a nasal bone fracture.  She also has a right radial head fracture on my independent interpretation of the elbow film.  Will place in a posterior splint.  Sling.  Orthopedic follow-up.  Patient is complaining of some left-sided chest wall tenderness.  Will obtain a plain film.  With the patient having acute neurologic symptoms concern for possible stroke.  I suppose she also could have had an acute facial nerve compression that does not completely explain the change in vision to the left eye.  We will obtain an MRI of the brain.  Signed out to Dr. Betsey Holiday, please see their note for further details of care in the ED.  The patients results and plan were reviewed and discussed.   Any x-rays performed were  independently reviewed by myself.   Differential diagnosis were considered with the presenting HPI.  Medications  acetaminophen (TYLENOL) tablet 1,000 mg (1,000 mg Oral Given 03/16/22 2157)    Vitals:   03/16/22 1952 03/16/22 2200 03/16/22 2235 03/16/22 2319  BP:  (!) 161/87  (!) 174/85  Pulse:  82 79 72  Resp:  16  16  Temp:      TempSrc:      SpO2:  96% 96% 98%  Weight: 78 kg     Height: '5\' 5"'$  (1.651 m)       Final diagnoses:  Closed nondisplaced fracture of head of right radius, initial encounter  Contusion of face, initial encounter  Facial droop  Rib pain on left side            Final Clinical Impression(s) / ED Diagnoses Final diagnoses:  Closed nondisplaced fracture of head of right radius, initial encounter  Contusion of face, initial encounter  Facial droop  Rib pain on left side    Rx / DC Orders ED Discharge Orders          Ordered    Ambulatory referral to Neurology       Comments: Facial droop, transient vision loss   03/16/22 2327              Deno Etienne, DO 03/16/22 2328

## 2022-03-16 NOTE — ED Provider Triage Note (Signed)
Emergency Medicine Provider Triage Evaluation Note  Erika Ramos , a 76 y.o. female  was evaluated in triage.  Pt complains of fall, at 2pm, walking across the street, tripped and fell, hitting left side face and hands on ground. Pain in right forearm to elbow, left lower rib pain. Not on blood thinners (took 2 regular ASA today after the fall). Came if at this time for right forearm pain.  Family told her she may have had a mini stroke. States her vision was "dark" in the left eye right after the fall but resolved quickly afterwards Hx Bells palsy, states her left side face feels droopy like when she had bells palsy.  Review of Systems  Positive: As above Negative: As above  Physical Exam  BP (!) 162/79 (BP Location: Left Arm)   Pulse 93   Temp 98.4 F (36.9 C) (Oral)   Resp 17   SpO2 95%  Gen:   Awake, no distress   Resp:  Normal effort  MSK:   Moves extremities without difficulty  Other:  Right wrist TTP, left side facial weakness, no arm drift   Medical Decision Making  Medically screening exam initiated at 7:40 PM.  Appropriate orders placed.  JOURNII NIERMAN was informed that the remainder of the evaluation will be completed by another provider, this initial triage assessment does not replace that evaluation, and the importance of remaining in the ED until their evaluation is complete.     Tacy Learn, PA-C 03/16/22 1945

## 2022-03-17 ENCOUNTER — Ambulatory Visit: Payer: Self-pay | Admitting: "Endocrinology

## 2022-03-17 DIAGNOSIS — S52124A Nondisplaced fracture of head of right radius, initial encounter for closed fracture: Secondary | ICD-10-CM | POA: Diagnosis not present

## 2022-03-17 DIAGNOSIS — I6523 Occlusion and stenosis of bilateral carotid arteries: Secondary | ICD-10-CM | POA: Diagnosis not present

## 2022-03-17 DIAGNOSIS — G319 Degenerative disease of nervous system, unspecified: Secondary | ICD-10-CM | POA: Diagnosis not present

## 2022-03-17 MED ORDER — GADOPICLENOL 0.5 MMOL/ML IV SOLN
8.0000 mL | Freq: Once | INTRAVENOUS | Status: AC | PRN
  Administered 2022-03-17: 8 mL via INTRAVENOUS

## 2022-03-17 NOTE — ED Provider Notes (Addendum)
Patient was signed out to me by Dr. Tyrone Nine.  Patient initially seen for injuries after a fall.  Patient complaining of rib pain, facial pain from the fall.  CT maxillofacial bones shows no fracture.  X-ray of chest does not show any displaced rib fractures.  Patient complaining of right arm pain, found to have radial head fracture, placed in a splint.  Patient reported several minutes of difficulty seeing out of the left field-of-view immediately after the fall.  Family also noted left facial drooping.  Visual disturbance resolved but she still has some drooping on the left side of her face.  There is, however, a great deal of swelling in that area secondary to the fall.  TIA was considered.  Patient underwent MRI brain, MRA head and neck.  No acute abnormalities are noted.  Discussed with Dr. Rory Percy.  Patient has an ABCD 2 score of 2 and this is not even clearly a TIA.  Does not require hospitalization at this time, can follow-up with primary care and orthopedics.  Patient declines prescription analgesia.   Orpah Greek, MD 03/17/22 5726    Orpah Greek, MD 03/17/22 [phone]

## 2022-03-17 NOTE — Progress Notes (Signed)
Orthopedic Tech Progress Note Patient Details:  Erika Ramos 1945/09/01 [id number]  Ortho Devices Type of Ortho Device: Arm sling, Post (long arm) splint Ortho Device/Splint Location: rue Ortho Device/Splint Interventions: Ordered, Application, Adjustment   Post Interventions Patient Tolerated: Well Instructions Provided: Care of device, Adjustment of device  Karolee Stamps 03/17/2022, 12:13 AM

## 2022-03-17 NOTE — ED Notes (Signed)
Patient verbalizes understanding of d/c instructions. Opportunities for questions and answers were provided. Pt d/c from ED and wheeled to lobby with daughter.  

## 2022-03-25 ENCOUNTER — Encounter: Payer: Self-pay | Admitting: Orthopaedic Surgery

## 2022-03-25 ENCOUNTER — Ambulatory Visit (INDEPENDENT_AMBULATORY_CARE_PROVIDER_SITE_OTHER): Payer: Medicare HMO | Admitting: Orthopaedic Surgery

## 2022-03-25 DIAGNOSIS — S52134A Nondisplaced fracture of neck of right radius, initial encounter for closed fracture: Secondary | ICD-10-CM

## 2022-03-25 NOTE — Progress Notes (Signed)
Office Visit Note   Patient: Erika Ramos           Date of Birth: August 25, 1945           MRN: [id number] Visit Date: 03/25/2022              Requested by: Sharilyn Sites, [address] [place],  [place] [address] PCP: Sharilyn Sites, MD   Assessment & Plan: Visit Diagnoses:  1. Closed nondisplaced fracture of neck of right radius, initial encounter     Plan: Impression is nondisplaced right radial neck fracture.  This should be amenable to nonoperative treatment.  We will place her in a sling for the next 2 weeks.  She will be nonweightbearing to the right upper extremity.  Follow-up in 2 weeks for repeat evaluation and x-rays of the right elbow.  Call with concerns or questions in the meantime.  Follow-Up Instructions: Return in about 2 weeks (around 04/08/2022).   Orders:  No orders of the defined types were placed in this encounter.  No orders of the defined types were placed in this encounter.     Procedures: No procedures performed   Clinical Data: No additional findings.   Subjective: Chief Complaint  Patient presents with   Right Elbow - Injury    DOI 03/16/2022    HPI patient is a pleasant 76 year old female who comes in today following an injury to her right elbow.  About a week and a half ago, she sustained a mechanical fall landing on her right side.  She was seen in the ED where x-rays of her elbow were obtained.  X-rays demonstrated nondisplaced radial neck fracture and a possible avulsion to the ulnar styloid.  She is placed in a long-arm splint and sling.  She has been taking Tylenol.  She denies any paresthesias to the right upper extremity.  She notes slight discomfort which is relieved with Tylenol.  Review of Systems as detailed in HPI.  All others reviewed and are negative.   Objective: Vital Signs: There were no vitals taken for this visit.  Physical Exam well-developed well-nourished female no acute distress.  Alert and oriented  x3.  Ortho Exam right elbow exam shows very mild tenderness to the radial neck.  No pain to the ulnar styloid.  She has increased pain with supination and pronation of the elbow.  She is neurovascular intact distally.  Specialty Comments:  No specialty comments available.  Imaging: No new imaging   PMFS History: Patient Active Problem List   Diagnosis Date Noted   Abnormal myocardial perfusion study    Vitiligo 02/28/2015   Obesity (BMI 30-39.9) 08/23/2014   Epiretinal membrane, right eye 06/25/2011   GERD 12/03/2009   HEMATOCHEZIA 12/03/2009   EDEMA 09/12/2009   DYSPNEA 09/12/2009   Breast cancer, stage 2, right ([place]) 09/06/2009   HYPERLIPIDEMIA 09/06/2009   Essential hypertension 09/06/2009   CAD, NATIVE VESSEL 09/06/2009   Past Medical History:  Diagnosis Date   Breast cancer ([place])    Breast cancer, stage 2, right ([place]) 09/06/2009   Qualifier: Diagnosis of  By: Orville Govern, CMA, Carol     Coronary artery disease    Diabetes mellitus without complication ([place])    Fatty liver disease, nonalcoholic    GERD (gastroesophageal reflux disease)    Hypertension    PONV (postoperative nausea and vomiting)    Nausea/ Vomitting- last time 2010   Stented coronary artery     Family History  Problem Relation Age of Onset  Heart disease Mother    Ovarian cancer Mother    Stroke Father    Diabetes Sister    Diabetes Brother    Diabetes Sister    Heart disease Brother    Cancer Brother        spine   Anesthesia problems Neg Hx     Past Surgical History:  Procedure Laterality Date   ABDOMINAL HYSTERECTOMY     BREAST SURGERY  March 2010   cataract  Right 09/2012   CATARACT EXTRACTION  01/01/2018   left eye   COLONOSCOPY WITH PROPOFOL N/A 07/05/2021   Procedure: COLONOSCOPY WITH PROPOFOL;  Surgeon: Eloise Harman, DO;  [facility];  Service: Endoscopy;  Laterality: N/A;  8:45am   FRACTURE SURGERY     wrist  right   LEFT HEART CATH AND CORONARY ANGIOGRAPHY N/A  09/10/2021   Procedure: LEFT HEART CATH AND CORONARY ANGIOGRAPHY;  Surgeon: Belva Crome, MD;  Location: [facility];  Service: Cardiovascular;  Laterality: N/A;   PARS PLANA VITRECTOMY  07/03/2011   Procedure: PARS PLANA VITRECTOMY WITH 25 GAUGE;  Surgeon: Hayden Pedro, MD;  Location: [facility];  Service: Ophthalmology;  Laterality: Right;  MEMBRANE PEEL, HEAD SCOPE LASER, GAS   POLYPECTOMY  07/05/2021   Procedure: POLYPECTOMY;  Surgeon: Eloise Harman, DO;  [facility];  Service: Endoscopy;;   RETINAL DETACHMENT SURGERY     RETINAL DETACHMENT SURGERY  07/03/11   right   TONSILLECTOMY     Social History   Occupational History   Not on file  Tobacco Use   Smoking status: Never   Smokeless tobacco: Never  Vaping Use   Vaping Use: Never used  Substance and Sexual Activity   Alcohol use: No   Drug use: No   Sexual activity: Not on file    Comment: single-2 grown children

## 2022-03-28 DIAGNOSIS — S0990XA Unspecified injury of head, initial encounter: Secondary | ICD-10-CM | POA: Diagnosis not present

## 2022-03-28 DIAGNOSIS — J0100 Acute maxillary sinusitis, unspecified: Secondary | ICD-10-CM | POA: Diagnosis not present

## 2022-03-28 DIAGNOSIS — J209 Acute bronchitis, unspecified: Secondary | ICD-10-CM | POA: Diagnosis not present

## 2022-03-28 DIAGNOSIS — Z6829 Body mass index (BMI) 29.0-29.9, adult: Secondary | ICD-10-CM | POA: Diagnosis not present

## 2022-03-28 DIAGNOSIS — S2232XA Fracture of one rib, left side, initial encounter for closed fracture: Secondary | ICD-10-CM | POA: Diagnosis not present

## 2022-03-28 DIAGNOSIS — C50919 Malignant neoplasm of unspecified site of unspecified female breast: Secondary | ICD-10-CM | POA: Diagnosis not present

## 2022-04-04 ENCOUNTER — Ambulatory Visit (INDEPENDENT_AMBULATORY_CARE_PROVIDER_SITE_OTHER): Payer: Medicare HMO | Admitting: Neurology

## 2022-04-04 ENCOUNTER — Encounter: Payer: Self-pay | Admitting: Neurology

## 2022-04-04 VITALS — BP 157/85 | HR 82 | Ht 65.0 in | Wt 177.3 lb

## 2022-04-04 DIAGNOSIS — G44309 Post-traumatic headache, unspecified, not intractable: Secondary | ICD-10-CM | POA: Diagnosis not present

## 2022-04-04 DIAGNOSIS — S060XAD Concussion with loss of consciousness status unknown, subsequent encounter: Secondary | ICD-10-CM | POA: Diagnosis not present

## 2022-04-04 DIAGNOSIS — W19XXXD Unspecified fall, subsequent encounter: Secondary | ICD-10-CM | POA: Diagnosis not present

## 2022-04-04 NOTE — Patient Instructions (Signed)
Continue with Tylenol as needed for headache Continue to follow with orthopedist Follow with your PCP Return as needed

## 2022-04-04 NOTE — Progress Notes (Signed)
[facility]  PATIENT: Erika Ramos DOB: Jul 24, 1945  REQUESTING CLINICIAN: Deno Etienne, DO HISTORY FROM: Patient  REASON FOR VISIT: Mechanical fall.   HISTORICAL  CHIEF COMPLAINT:  Chief Complaint  Patient presents with   New Patient (Initial Visit)    Rm 12 here for hospital follow up. On 03/16/2022 went to ER due to a fall. Upon assessment the left side of her face had slight droop. She also reports at the time of her fall she hit her head and for 1 minute vision went black. Since discharge she has been doing well.      HISTORY OF PRESENT ILLNESS:  This is a 76 year old woman past medical history hypertension, hyperlipidemia, diabetes mellitus and depression who is presenting after being admitted to the hospital on September 24.  [place]  She reported tripping and falling, hitting the ground with the left side of her face.  She denies any loss of consciousness but stated that she had severe facial bruising and swelling.  Right after the fall, she reports losing vision for less than a minute and seeing floaters. (Unclear if she loss consciousness or not). When she got home, her family was concerned about left facial droop therefore she was presented to the ED to rule out stroke TIA.  Her MRI/MRA was negative for any acute stroke, but she was found to have a new right radial neck fracture.  She saw orthopedics yesterday and arm is in the sling.  She reports 24 years ago history of left-sided Bell's palsy and felt like her left side of her face was swollen at that time with her left facial droop was more noted.  Since discharge from the hospital she has complained of headaches, will have headache 3-4 times a week, Tylenol does help.  The headache frequency is improving.  Denies any additional falls.  OTHER MEDICAL CONDITIONS: Hypertension, hyperlipidemia, diabetes mellitus, depression,   REVIEW  OF SYSTEMS: Full 14 system review of systems performed and negative with exception of: As noted in the HPI   ALLERGIES: Allergies  Allergen Reactions   Clopidogrel Bisulfate Hives   Oxycontin [Oxycodone Hcl] Nausea And Vomiting    HOME MEDICATIONS: Outpatient Medications Prior to Visit  Medication Sig Dispense Refill   aspirin EC 81 MG tablet Take 81 mg by mouth daily.     Biotin 5000 MCG CHEW Chew by mouth daily. Per patient taking 2 chews daily     Calcium-Vitamin D-Vitamin K [id number] MG-UNT-MCG CHEW Chew 1 each by mouth daily.      carvedilol (COREG) 12.5 MG tablet Take 1 tablet (12.5 mg total) by mouth 2 (two) times daily with a meal. 180 tablet 3   Cholecalciferol (VITAMIN D3 GUMMIES ADULT PO) Take 2,000 Units by mouth daily.     Cyanocobalamin (VITAMIN B-12 PO) Take 1 tablet by mouth daily.     dapagliflozin propanediol (FARXIGA) 10 MG TABS tablet Take 10 mg by mouth daily.     escitalopram (LEXAPRO) 10 MG tablet Take 10 mg by mouth daily.     fenofibrate (TRICOR) 145 MG tablet Take 1 tablet (145 mg total) by mouth daily. 90 tablet 3   JANUMET 50-1000 MG tablet Take 1 tablet by mouth 2 (two) times daily with a meal.      Multiple Vitamins-Minerals (CENTRUM SILVER ULTRA WOMENS PO) Take 2 tablets by mouth daily.     nitroGLYCERIN (NITROSTAT) 0.4 MG  SL tablet Place 1 tablet (0.4 mg total) under the tongue every 5 (five) minutes as needed for chest pain. 25 tablet 3   Omega-3 Fatty Acids (FISH OIL PO) Take 1 capsule by mouth daily at 6 (six) AM. Per patient taking daily not taking at 6:00 am     omeprazole (PRILOSEC) 20 MG capsule Take 20 mg by mouth daily.     ramipril (ALTACE) 10 MG capsule Take 10 mg by mouth daily.     rosuvastatin (CRESTOR) 40 MG tablet Take 1 tablet (40 mg total) by mouth daily. 90 tablet 3   vitamin C (ASCORBIC ACID) 250 MG tablet Take 500 mg by mouth daily.     zinc gluconate 50 MG tablet Take 50 mg by mouth 3 (three) times a week.      amoxicillin-clavulanate (AUGMENTIN) 875-125 MG tablet Take 1 tablet by mouth 2 (two) times daily.     MYRBETRIQ 50 MG TB24 tablet Take 50 mg by mouth daily.     mirabegron ER (MYRBETRIQ) 25 MG TB24 tablet Take 1 tablet (25 mg total) by mouth at bedtime. (Patient not taking: Reported on 04/04/2022) 30 tablet 11   No facility-administered medications prior to visit.    PAST MEDICAL HISTORY: Past Medical History:  Diagnosis Date   Breast cancer ([place])    Breast cancer, stage 2, right ([place]) 09/06/2009   Qualifier: Diagnosis of  By: Orville Govern, CMA, Carol     Coronary artery disease    Diabetes mellitus without complication ([place])    Fatty liver disease, nonalcoholic    GERD (gastroesophageal reflux disease)    Hypertension    PONV (postoperative nausea and vomiting)    Nausea/ Vomitting- last time 2010   Stented coronary artery     PAST SURGICAL HISTORY: Past Surgical History:  Procedure Laterality Date   ABDOMINAL HYSTERECTOMY     BREAST SURGERY  March 2010   cataract  Right 09/2012   CATARACT EXTRACTION  01/01/2018   left eye   COLONOSCOPY WITH PROPOFOL N/A 07/05/2021   Procedure: COLONOSCOPY WITH PROPOFOL;  Surgeon: Eloise Harman, DO;  [facility];  Service: Endoscopy;  Laterality: N/A;  8:45am   FRACTURE SURGERY     wrist  right   LEFT HEART CATH AND CORONARY ANGIOGRAPHY N/A 09/10/2021   Procedure: LEFT HEART CATH AND CORONARY ANGIOGRAPHY;  Surgeon: Belva Crome, MD;  Location: [facility];  Service: Cardiovascular;  Laterality: N/A;   PARS PLANA VITRECTOMY  07/03/2011   Procedure: PARS PLANA VITRECTOMY WITH 25 GAUGE;  Surgeon: Hayden Pedro, MD;  Location: [facility];  Service: Ophthalmology;  Laterality: Right;  MEMBRANE PEEL, HEAD SCOPE LASER, GAS   POLYPECTOMY  07/05/2021   Procedure: POLYPECTOMY;  Surgeon: Eloise Harman, DO;  [facility];  Service: Endoscopy;;   RETINAL DETACHMENT SURGERY     RETINAL DETACHMENT SURGERY  07/03/11   right    TONSILLECTOMY      FAMILY HISTORY: Family History  Problem Relation Age of Onset   Heart disease Mother    Ovarian cancer Mother    Stroke Father    Diabetes Sister    Diabetes Brother    Diabetes Sister    Heart disease Brother    Cancer Brother        spine   Anesthesia problems Neg Hx     SOCIAL HISTORY: Social History   Socioeconomic History   Marital status: Divorced    Spouse name: Not on file  Number of children: Not on file   Years of education: Not on file   Highest education level: Not on file  Occupational History   Not on file  Tobacco Use   Smoking status: Never   Smokeless tobacco: Never  Vaping Use   Vaping Use: Never used  Substance and Sexual Activity   Alcohol use: No   Drug use: No   Sexual activity: Not on file    Comment: single-2 grown children  Other Topics Concern   Not on file  Social History Narrative   Not on file   Social Determinants of Health   Financial Resource Strain: Not on file  Food Insecurity: Not on file  Transportation Needs: Not on file  Physical Activity: Not on file  Stress: Not on file  Social Connections: Not on file  Intimate Partner Violence: Not on file    PHYSICAL EXAM  GENERAL EXAM/CONSTITUTIONAL: Vitals:  Vitals:   04/04/22 0818  BP: (!) 157/85  Pulse: 82  Weight: 177 lb 5 oz (80.4 kg)  Height: '5\' 5"'$  (1.651 m)   Body mass index is 29.51 kg/m. Wt Readings from Last 3 Encounters:  04/04/22 177 lb 5 oz (80.4 kg)  03/16/22 172 lb (78 kg)  02/13/22 175 lb (79.4 kg)   Patient is in no distress; well developed, nourished and groomed; neck is supple  EYES: Pupils round and reactive to light, Visual fields full to confrontation, Extraocular movements intacts,   MUSCULOSKELETAL: Gait, strength, tone, movements noted in Neurologic exam below  NEUROLOGIC: MENTAL STATUS:      No data to display         awake, alert, oriented to person, place and time recent and remote memory intact normal  attention and concentration language fluent, comprehension intact, naming intact fund of knowledge appropriate  CRANIAL NERVE:  2nd, 3rd, 4th, 6th - pupils equal and reactive to light, visual fields full to confrontation, extraocular muscles intact, no nystagmus 5th - facial sensation symmetric 7th - facial strength symmetric 8th - hearing intact 9th - palate elevates symmetrically, uvula midline 11th - shoulder shrug symmetric 12th - tongue protrusion midline  MOTOR:  normal bulk and tone, full strength in the BUE, BLE. RUE not tested. Right arm in a sling  SENSORY:  normal and symmetric to light touch  COORDINATION:  finger-nose-finger, fine finger movements normal  REFLEXES:  deep tendon reflexes present and symmetric  GAIT/STATION:  normal   DIAGNOSTIC DATA (LABS, IMAGING, TESTING) - I reviewed patient records, labs, notes, testing and imaging myself where available.  Lab Results  Component Value Date   WBC 8.7 03/16/2022   HGB 13.3 03/16/2022   HCT 39.0 03/16/2022   MCV 86.4 03/16/2022   PLT 207 03/16/2022      Component Value Date/Time   NA 135 03/16/2022 2002   NA 139 09/06/2021 1233   K 4.0 03/16/2022 2002   CL 103 03/16/2022 2002   CO2 19 (L) 03/16/2022 1948   GLUCOSE 286 (H) 03/16/2022 2002   BUN 18 03/16/2022 2002   BUN 16 09/06/2021 1233   CREATININE 0.60 03/16/2022 2002   CALCIUM 9.3 03/16/2022 1948   PROT 6.7 03/16/2022 1948   PROT 6.7 02/13/2022 1025   ALBUMIN 3.9 03/16/2022 1948   ALBUMIN 4.6 02/13/2022 1025   AST 62 (H) 03/16/2022 1948   ALT 37 03/16/2022 1948   ALKPHOS 55 03/16/2022 1948   BILITOT 0.3 03/16/2022 1948   BILITOT 0.3 02/13/2022 1025   GFRNONAA >60 03/16/2022  1948   GFRAA 52 (L) 07/17/2017 2118   Lab Results  Component Value Date   CHOL 129 02/13/2022   HDL 40 02/13/2022   LDLCALC 63 02/13/2022   TRIG 152 (H) 02/13/2022   CHOLHDL 3.2 02/13/2022   Lab Results  Component Value Date   HGBA1C 11.3 (H) 02/13/2022    No results found for: "VITAMINB12" No results found for: "TSH"  MRI HEAD IMPRESSION: 1. No acute intracranial abnormality. 2. Generalized age-related cerebral atrophy with chronic small vessel ischemic disease, with a few scattered remote lacunar infarcts involving the bilateral basal ganglia.   MRA HEAD IMPRESSION: 1. Motion degraded exam. 2. 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 Left vertebral artery strongly dominant and widely patent. No other acute large vessel occlusion. 3. Moderate intracranial atherosclerotic disease, with most notable findings include mild to moderate stenoses about both carotid siphons and the right P2 segment. No other proximal high-grade or correctable stenosis.   MRA NECK IMPRESSION: 1. Negative MRA of the neck for large vessel occlusion, dissection, or other acute vascular abnormality. 2. Wide patency of both carotid artery systems and vertebral arteries within the neck. No hemodynamically significant stenosis. 3. Diffuse tortuosity of the major arterial vasculature of the neck, suggesting chronic underlying hypertension.    ASSESSMENT AND PLAN  76 y.o. year old female with medical history include hypertension, hyperlipidemia, diabetes mellitus and depression who is presenting after a fall.  Patient likely suffered a concussion from the fall, had postconcussive headaches which are now improving.  I informed patient that she less likely had a TIA.  Her MRI brain was negative for any acute stroke.  At the moment continue Tylenol as needed for the headache, continue to follow with PCP and return as needed.   1. Accident due to mechanical fall without injury, subsequent encounter   2. Concussion with unknown loss of consciousness status, subsequent encounter   3. Post-traumatic headache, not intractable, unspecified chronicity pattern      Patient Instructions  Continue with Tylenol as needed for  headache Continue to follow with orthopedist Follow with your PCP Return as needed  No orders of the defined types were placed in this encounter.   No orders of the defined types were placed in this encounter.   Return if symptoms worsen or fail to improve.  I have spent a total of 45 minutes dedicated to this patient today, preparing to see patient, performing a medically appropriate examination and evaluation, ordering tests and/or medications and procedures, and counseling and educating the patient/family/caregiver; independently interpreting result and communicating results to the family/patient/caregiver; and documenting clinical information in the electronic medical record.   Alric Ran, MD 04/04/2022, 8:45 AM  [facility] [address] [place],  [address] [phone]

## 2022-04-08 ENCOUNTER — Ambulatory Visit (INDEPENDENT_AMBULATORY_CARE_PROVIDER_SITE_OTHER): Payer: Medicare HMO | Admitting: Orthopaedic Surgery

## 2022-04-08 ENCOUNTER — Ambulatory Visit (INDEPENDENT_AMBULATORY_CARE_PROVIDER_SITE_OTHER): Payer: Medicare HMO

## 2022-04-08 DIAGNOSIS — S52134A Nondisplaced fracture of neck of right radius, initial encounter for closed fracture: Secondary | ICD-10-CM

## 2022-04-08 NOTE — Progress Notes (Unsigned)
Office Visit Note   Patient: Erika Ramos           Date of Birth: June 26, 1945           MRN: [id number] Visit Date: 04/08/2022              Requested by: Sharilyn Sites, [place],  [place] [id number] PCP: Sharilyn Sites, MD   Assessment & Plan: Visit Diagnoses:  1. Closed nondisplaced fracture of neck of right radius, initial encounter     Plan: Impression is 3 and half weeks status post right radial neck fracture.  Patient is clinically and radiographically improving.  She may come out of her sling at home but has been instructed to continue wearing this in public.  She may begin gentle range of motion to the right elbow.  Follow-up in 3 weeks for repeat evaluation and x-rays of the right elbow to include radial head view.  Call with concerns or questions in the meantime.  Follow-Up Instructions: Return in about 3 weeks (around 04/29/2022).   Orders:  Orders Placed This Encounter  Procedures   XR Elbow 2 Views Right   No orders of the defined types were placed in this encounter.     Procedures: No procedures performed   Clinical Data: No additional findings.   Subjective: Chief Complaint  Patient presents with   Right Elbow - Pain    HPI patient is a very pleasant 76 year old female who comes in today approximately 3-1/2 weeks status post right radial neck fracture.  She has been doing much better.  She has been compliant wearing the sling.  Minimal pain to the elbow and is taking aspirin at night as needed for pain.     Objective: Vital Signs: There were no vitals taken for this visit.    Ortho Exam right elbow exam shows moderate tenderness to the fracture site.  She has very slight limitation with flexion and extension of the elbow and has full pronation.  She is still having pain slight limited range of motion with supination.  She is neurovascular intact distally.  Specialty Comments:  No specialty comments  available.  Imaging: XR Elbow 2 Views Right  Result Date: 04/08/2022 X-rays demonstrate consolidation to the fracture site    PMFS History: Patient Active Problem List   Diagnosis Date Noted   Abnormal myocardial perfusion study    Vitiligo 02/28/2015   Obesity (BMI 30-39.9) 08/23/2014   Epiretinal membrane, right eye 06/25/2011   GERD 12/03/2009   HEMATOCHEZIA 12/03/2009   EDEMA 09/12/2009   DYSPNEA 09/12/2009   Breast cancer, stage 2, right ([place]) 09/06/2009   HYPERLIPIDEMIA 09/06/2009   Essential hypertension 09/06/2009   CAD, NATIVE VESSEL 09/06/2009   Past Medical History:  Diagnosis Date   Breast cancer ([place])    Breast cancer, stage 2, right ([place]) 09/06/2009   Qualifier: Diagnosis of  By: Orville Govern, CMA, Carol     Coronary artery disease    Diabetes mellitus without complication ([place])    Fatty liver disease, nonalcoholic    GERD (gastroesophageal reflux disease)    Hypertension    PONV (postoperative nausea and vomiting)    Nausea/ Vomitting- last time 2010   Stented coronary artery     Family History  Problem Relation Age of Onset   Heart disease Mother    Ovarian cancer Mother    Stroke Father    Diabetes Sister    Diabetes Brother    Diabetes Sister  Heart disease Brother    Cancer Brother        spine   Anesthesia problems Neg Hx     Past Surgical History:  Procedure Laterality Date   ABDOMINAL HYSTERECTOMY     BREAST SURGERY  March 2010   cataract  Right 09/2012   CATARACT EXTRACTION  01/01/2018   left eye   COLONOSCOPY WITH PROPOFOL N/A 07/05/2021   Procedure: COLONOSCOPY WITH PROPOFOL;  Surgeon: Eloise Harman, DO;  [facility];  Service: Endoscopy;  Laterality: N/A;  8:45am   FRACTURE SURGERY     wrist  right   LEFT HEART CATH AND CORONARY ANGIOGRAPHY N/A 09/10/2021   Procedure: LEFT HEART CATH AND CORONARY ANGIOGRAPHY;  Surgeon: Belva Crome, MD;  Location: [facility];  Service: Cardiovascular;  Laterality: N/A;   PARS  PLANA VITRECTOMY  07/03/2011   Procedure: PARS PLANA VITRECTOMY WITH 25 GAUGE;  Surgeon: Hayden Pedro, MD;  Location: [facility];  Service: Ophthalmology;  Laterality: Right;  MEMBRANE PEEL, HEAD SCOPE LASER, GAS   POLYPECTOMY  07/05/2021   Procedure: POLYPECTOMY;  Surgeon: Eloise Harman, DO;  [facility];  Service: Endoscopy;;   RETINAL DETACHMENT SURGERY     RETINAL DETACHMENT SURGERY  07/03/11   right   TONSILLECTOMY     Social History   Occupational History   Not on file  Tobacco Use   Smoking status: Never   Smokeless tobacco: Never  Vaping Use   Vaping Use: Never used  Substance and Sexual Activity   Alcohol use: No   Drug use: No   Sexual activity: Not on file    Comment: single-2 grown children

## 2022-04-09 ENCOUNTER — Other Ambulatory Visit (HOSPITAL_COMMUNITY): Payer: Self-pay | Admitting: Family Medicine

## 2022-04-09 DIAGNOSIS — Z1231 Encounter for screening mammogram for malignant neoplasm of breast: Secondary | ICD-10-CM

## 2022-04-14 DIAGNOSIS — E663 Overweight: Secondary | ICD-10-CM | POA: Diagnosis not present

## 2022-04-14 DIAGNOSIS — N390 Urinary tract infection, site not specified: Secondary | ICD-10-CM | POA: Diagnosis not present

## 2022-04-25 DIAGNOSIS — N3941 Urge incontinence: Secondary | ICD-10-CM | POA: Diagnosis not present

## 2022-04-25 DIAGNOSIS — R3915 Urgency of urination: Secondary | ICD-10-CM | POA: Diagnosis not present

## 2022-04-29 ENCOUNTER — Encounter: Payer: Self-pay | Admitting: Orthopaedic Surgery

## 2022-04-29 ENCOUNTER — Ambulatory Visit (INDEPENDENT_AMBULATORY_CARE_PROVIDER_SITE_OTHER): Payer: Medicare HMO | Admitting: Orthopaedic Surgery

## 2022-04-29 ENCOUNTER — Ambulatory Visit (INDEPENDENT_AMBULATORY_CARE_PROVIDER_SITE_OTHER): Payer: Medicare HMO

## 2022-04-29 DIAGNOSIS — S52134A Nondisplaced fracture of neck of right radius, initial encounter for closed fracture: Secondary | ICD-10-CM

## 2022-04-29 NOTE — Progress Notes (Signed)
Office Visit Note   Patient: Erika Ramos           Date of Birth: January 04, 1946           MRN: [id number] Visit Date: 04/29/2022              Requested by: Sharilyn Sites, [place],  [place] [address] PCP: Sharilyn Sites, MD   Assessment & Plan: Visit Diagnoses:  1. Closed nondisplaced fracture of neck of right radius, initial encounter     Plan: Impression is 6 weeks status post right radial neck fracture.  Patient is clinically and radiographically healing.  She may discontinue her sling altogether.  I do not think she needs outpatient physical therapy as she has full range of motion.  She will start to advance with activity as tolerated but will limit her lifting for the next few weeks.  Follow-up with [place] as needed.  Call with concerns or questions.  Follow-Up Instructions: Return if symptoms worsen or fail to improve.   Orders:  Orders Placed This Encounter  Procedures   XR Elbow Complete Right (3+View)   No orders of the defined types were placed in this encounter.     Procedures: No procedures performed   Clinical Data: No additional findings.   Subjective: Chief Complaint  Patient presents with   Right Elbow - Follow-up    Radial neck fracture    HPI patient is a pleasant 76 year old female who comes in today 6 weeks status post right radial  fracture.  She is doing well.  She has been compliant wearing her sling only in public.  Overall doing much better over the past week.     Objective: Vital Signs: There were no vitals taken for this visit.    Ortho Exam right elbow exam shows mild tenderness to the fracture site.  She has full and painless elbow flexion, extension, supination pronation.  She is neurovascular intact distally.  Specialty Comments:  No specialty comments available.  Imaging: XR Elbow Complete Right (3+View)  Result Date: 04/29/2022 X-rays demonstrate callus formation to the fracture site    PMFS  History: Patient Active Problem List   Diagnosis Date Noted   Abnormal myocardial perfusion study    Vitiligo 02/28/2015   Obesity (BMI 30-39.9) 08/23/2014   Epiretinal membrane, right eye 06/25/2011   GERD 12/03/2009   HEMATOCHEZIA 12/03/2009   EDEMA 09/12/2009   DYSPNEA 09/12/2009   Breast cancer, stage 2, right ([place]) 09/06/2009   HYPERLIPIDEMIA 09/06/2009   Essential hypertension 09/06/2009   CAD, NATIVE VESSEL 09/06/2009   Past Medical History:  Diagnosis Date   Breast cancer ([place])    Breast cancer, stage 2, right ([place]) 09/06/2009   Qualifier: Diagnosis of  By: Orville Govern, CMA, Carol     Coronary artery disease    Diabetes mellitus without complication ([place])    Fatty liver disease, nonalcoholic    GERD (gastroesophageal reflux disease)    Hypertension    PONV (postoperative nausea and vomiting)    Nausea/ Vomitting- last time 2010   Stented coronary artery     Family History  Problem Relation Age of Onset   Heart disease Mother    Ovarian cancer Mother    Stroke Father    Diabetes Sister    Diabetes Brother    Diabetes Sister    Heart disease Brother    Cancer Brother        spine   Anesthesia problems Neg Hx  Past Surgical History:  Procedure Laterality Date   ABDOMINAL HYSTERECTOMY     BREAST SURGERY  March 2010   cataract  Right 09/2012   CATARACT EXTRACTION  01/01/2018   left eye   COLONOSCOPY WITH PROPOFOL N/A 07/05/2021   Procedure: COLONOSCOPY WITH PROPOFOL;  Surgeon: Eloise Harman, DO;  [facility];  Service: Endoscopy;  Laterality: N/A;  8:45am   FRACTURE SURGERY     wrist  right   LEFT HEART CATH AND CORONARY ANGIOGRAPHY N/A 09/10/2021   Procedure: LEFT HEART CATH AND CORONARY ANGIOGRAPHY;  Surgeon: Belva Crome, MD;  Location: [facility];  Service: Cardiovascular;  Laterality: N/A;   PARS PLANA VITRECTOMY  07/03/2011   Procedure: PARS PLANA VITRECTOMY WITH 25 GAUGE;  Surgeon: Hayden Pedro, MD;  Location: [facility];  Service:  Ophthalmology;  Laterality: Right;  MEMBRANE PEEL, HEAD SCOPE LASER, GAS   POLYPECTOMY  07/05/2021   Procedure: POLYPECTOMY;  Surgeon: Eloise Harman, DO;  [facility];  Service: Endoscopy;;   RETINAL DETACHMENT SURGERY     RETINAL DETACHMENT SURGERY  07/03/11   right   TONSILLECTOMY     Social History   Occupational History   Not on file  Tobacco Use   Smoking status: Never   Smokeless tobacco: Never  Vaping Use   Vaping Use: Never used  Substance and Sexual Activity   Alcohol use: No   Drug use: No   Sexual activity: Not on file    Comment: single-2 grown children

## 2022-05-12 ENCOUNTER — Ambulatory Visit (HOSPITAL_COMMUNITY): Payer: Medicare HMO

## 2022-05-19 ENCOUNTER — Ambulatory Visit (HOSPITAL_COMMUNITY)
Admission: RE | Admit: 2022-05-19 | Discharge: 2022-05-19 | Disposition: A | Discharge status: Home / Self Care | Payer: Medicare HMO | Source: Ambulatory Visit | Attending: Family Medicine | Admitting: Family Medicine

## 2022-05-19 DIAGNOSIS — Z1231 Encounter for screening mammogram for malignant neoplasm of breast: Secondary | ICD-10-CM | POA: Insufficient documentation

## 2022-05-20 DIAGNOSIS — U071 COVID-19: Secondary | ICD-10-CM | POA: Diagnosis not present

## 2022-05-20 DIAGNOSIS — Z6829 Body mass index (BMI) 29.0-29.9, adult: Secondary | ICD-10-CM | POA: Diagnosis not present

## 2022-05-29 DIAGNOSIS — J209 Acute bronchitis, unspecified: Secondary | ICD-10-CM | POA: Diagnosis not present

## 2022-05-29 DIAGNOSIS — Z6829 Body mass index (BMI) 29.0-29.9, adult: Secondary | ICD-10-CM | POA: Diagnosis not present

## 2022-06-02 DIAGNOSIS — R69 Illness, unspecified: Secondary | ICD-10-CM | POA: Diagnosis not present

## 2022-06-02 DIAGNOSIS — Z888 Allergy status to other drugs, medicaments and biological substances status: Secondary | ICD-10-CM | POA: Diagnosis not present

## 2022-06-02 DIAGNOSIS — M1990 Unspecified osteoarthritis, unspecified site: Secondary | ICD-10-CM | POA: Diagnosis not present

## 2022-06-02 DIAGNOSIS — Z885 Allergy status to narcotic agent status: Secondary | ICD-10-CM | POA: Diagnosis not present

## 2022-06-02 DIAGNOSIS — E119 Type 2 diabetes mellitus without complications: Secondary | ICD-10-CM | POA: Diagnosis not present

## 2022-06-02 DIAGNOSIS — Z809 Family history of malignant neoplasm, unspecified: Secondary | ICD-10-CM | POA: Diagnosis not present

## 2022-06-02 DIAGNOSIS — Z7984 Long term (current) use of oral hypoglycemic drugs: Secondary | ICD-10-CM | POA: Diagnosis not present

## 2022-06-02 DIAGNOSIS — Z823 Family history of stroke: Secondary | ICD-10-CM | POA: Diagnosis not present

## 2022-06-02 DIAGNOSIS — I10 Essential (primary) hypertension: Secondary | ICD-10-CM | POA: Diagnosis not present

## 2022-06-02 DIAGNOSIS — Z8249 Family history of ischemic heart disease and other diseases of the circulatory system: Secondary | ICD-10-CM | POA: Diagnosis not present

## 2022-06-02 DIAGNOSIS — N3941 Urge incontinence: Secondary | ICD-10-CM | POA: Diagnosis not present

## 2022-06-02 DIAGNOSIS — I2510 Atherosclerotic heart disease of native coronary artery without angina pectoris: Secondary | ICD-10-CM | POA: Diagnosis not present

## 2022-06-02 DIAGNOSIS — F325 Major depressive disorder, single episode, in full remission: Secondary | ICD-10-CM | POA: Diagnosis not present

## 2022-06-12 ENCOUNTER — Other Ambulatory Visit: Payer: Self-pay

## 2022-06-12 ENCOUNTER — Encounter: Payer: Self-pay | Admitting: "Endocrinology

## 2022-06-12 ENCOUNTER — Ambulatory Visit (INDEPENDENT_AMBULATORY_CARE_PROVIDER_SITE_OTHER): Payer: Medicare HMO | Admitting: "Endocrinology

## 2022-06-12 VITALS — BP 138/74 | HR 80 | Ht 65.0 in | Wt 170.0 lb

## 2022-06-12 DIAGNOSIS — I10 Essential (primary) hypertension: Secondary | ICD-10-CM | POA: Diagnosis not present

## 2022-06-12 DIAGNOSIS — E119 Type 2 diabetes mellitus without complications: Secondary | ICD-10-CM

## 2022-06-12 DIAGNOSIS — Z7984 Long term (current) use of oral hypoglycemic drugs: Secondary | ICD-10-CM | POA: Diagnosis not present

## 2022-06-12 DIAGNOSIS — E782 Mixed hyperlipidemia: Secondary | ICD-10-CM

## 2022-06-12 LAB — POCT GLYCOSYLATED HEMOGLOBIN (HGB A1C): HbA1c, POC (controlled diabetic range): 10.8 % — AB (ref 0.0–7.0)

## 2022-06-12 MED ORDER — ACCU-CHEK GUIDE VI STRP: ORAL_STRIP | 2 refills | Status: DC

## 2022-06-12 MED ORDER — ACCU-CHEK SOFTCLIX LANCETS MISC: 2 refills | Status: DC

## 2022-06-12 MED ORDER — ACCU-CHEK GUIDE ME W/DEVICE KIT: 1.0000 | PACK | 0 refills | Status: DC

## 2022-06-12 MED ORDER — ACCU-CHEK GUIDE ME W/DEVICE KIT: PACK | 0 refills | Status: DC

## 2022-06-12 NOTE — Progress Notes (Signed)
Endocrinology Consult Note       06/12/2022, 4:25 PM   Subjective:    Patient ID: Erika Ramos, female    DOB: Oct 30, 1945.  Erika Ramos is being seen in consultation for management of currently uncontrolled symptomatic diabetes requested by  Sharilyn Sites, MD.   Past Medical History:  Diagnosis Date   Breast cancer [facility])    Breast cancer, stage 2, right ([place]) 09/06/2009   Qualifier: Diagnosis of  By: Orville Govern, CMA, Carol     Coronary artery disease    Diabetes mellitus without complication ([place])    Fatty liver disease, nonalcoholic    GERD (gastroesophageal reflux disease)    Hypertension    PONV (postoperative nausea and vomiting)    Nausea/ Vomitting- last time 2010   Stented coronary artery     Past Surgical History:  Procedure Laterality Date   ABDOMINAL HYSTERECTOMY     BREAST SURGERY  March 2010   cataract  Right 09/2012   CATARACT EXTRACTION  01/01/2018   left eye   COLONOSCOPY WITH PROPOFOL N/A 07/05/2021   Procedure: COLONOSCOPY WITH PROPOFOL;  Surgeon: Eloise Harman, DO;  [facility];  Service: Endoscopy;  Laterality: N/A;  8:45am   FRACTURE SURGERY     wrist  right   LEFT HEART CATH AND CORONARY ANGIOGRAPHY N/A 09/10/2021   Procedure: LEFT HEART CATH AND CORONARY ANGIOGRAPHY;  Surgeon: Belva Crome, MD;  Location: [facility];  Service: Cardiovascular;  Laterality: N/A;   PARS PLANA VITRECTOMY  07/03/2011   Procedure: PARS PLANA VITRECTOMY WITH 25 GAUGE;  Surgeon: Hayden Pedro, MD;  Location: [facility];  Service: Ophthalmology;  Laterality: Right;  MEMBRANE PEEL, HEAD SCOPE LASER, GAS   POLYPECTOMY  07/05/2021   Procedure: POLYPECTOMY;  Surgeon: Eloise Harman, DO;  [facility];  Service: Endoscopy;;   RETINAL DETACHMENT SURGERY     RETINAL DETACHMENT SURGERY  07/03/11   right   TONSILLECTOMY      Social History   Socioeconomic  History   Marital status: Widowed    Spouse name: Not on file   Number of children: Not on file   Years of education: Not on file   Highest education level: Not on file  Occupational History   Not on file  Tobacco Use   Smoking status: Never   Smokeless tobacco: Never  Vaping Use   Vaping Use: Never used  Substance and Sexual Activity   Alcohol use: No   Drug use: No   Sexual activity: Not on file    Comment: single-2 grown children  Other Topics Concern   Not on file  Social History Narrative   Not on file   Social Determinants of Health   Financial Resource Strain: Not on file  Food Insecurity: Not on file  Transportation Needs: Not on file  Physical Activity: Not on file  Stress: Not on file  Social Connections: Not on file    Family History  Problem Relation Age of Onset   Heart disease Mother    Ovarian cancer Mother    Stroke Father  Diabetes Sister    Diabetes Brother    Diabetes Sister    Heart disease Brother    Cancer Brother        spine   Anesthesia problems Neg Hx     Outpatient Encounter Medications as of 06/12/2022  Medication Sig   Blood Glucose Monitoring Suppl (ACCU-CHEK GUIDE ME) w/Device KIT 1 Piece by Does not apply route as directed.   Calcium-Vitamin D-Vitamin K [id number] MG-UNT-MCG CHEW Chew 1 each by mouth daily.   glucose blood (ACCU-CHEK GUIDE) test strip Use as instructed   vitamin C (ASCORBIC ACID) 250 MG tablet Take 500 mg by mouth daily.   aspirin EC 81 MG tablet Take 81 mg by mouth daily.   Biotin 5000 MCG CHEW Chew by mouth daily. Per patient taking 2 chews daily   carvedilol (COREG) 12.5 MG tablet Take 1 tablet (12.5 mg total) by mouth 2 (two) times daily with a meal.   Cholecalciferol (VITAMIN D3 GUMMIES ADULT PO) Take 2,000 Units by mouth daily.   Cyanocobalamin (VITAMIN B-12 PO) Take 1 tablet by mouth daily. (Patient not taking: Reported on 06/12/2022)   dapagliflozin propanediol (FARXIGA) 10 MG TABS tablet Take 10 mg  by mouth daily.   escitalopram (LEXAPRO) 10 MG tablet Take 10 mg by mouth daily.   fenofibrate (TRICOR) 145 MG tablet Take 1 tablet (145 mg total) by mouth daily.   JANUMET 50-1000 MG tablet Take 1 tablet by mouth 2 (two) times daily with a meal.    Multiple Vitamins-Minerals (CENTRUM SILVER ULTRA WOMENS PO) Take 2 tablets by mouth daily.   MYRBETRIQ 50 MG TB24 tablet Take 50 mg by mouth daily.   nitroGLYCERIN (NITROSTAT) 0.4 MG SL tablet Place 1 tablet (0.4 mg total) under the tongue every 5 (five) minutes as needed for chest pain.   Omega-3 Fatty Acids (FISH OIL PO) Take 1 capsule by mouth daily at 6 (six) AM. Per patient taking daily not taking at 6:00 am   omeprazole (PRILOSEC) 20 MG capsule Take 20 mg by mouth daily.   ramipril (ALTACE) 10 MG capsule Take 10 mg by mouth daily.   rosuvastatin (CRESTOR) 40 MG tablet Take 1 tablet (40 mg total) by mouth daily.   zinc gluconate 50 MG tablet Take 50 mg by mouth daily.   [DISCONTINUED] amoxicillin-clavulanate (AUGMENTIN) 875-125 MG tablet Take 1 tablet by mouth 2 (two) times daily. (Patient not taking: Reported on 06/12/2022)   No facility-administered encounter medications on file as of 06/12/2022.    ALLERGIES: Allergies  Allergen Reactions   Clopidogrel Bisulfate Hives   Oxycontin [Oxycodone Hcl] Nausea And Vomiting    VACCINATION STATUS: Immunization History  Administered Date(s) Administered   Influenza,inj,Quad PF,6+ Mos 04/03/2016   PFIZER(Purple Top)SARS-COV-2 Vaccination 07/31/2019, 08/24/2019    Diabetes She presents for her initial diabetic visit. She has type 2 diabetes mellitus. Onset time: She was diagnosed at approximate age of 33 years. There are no hypoglycemic associated symptoms. Pertinent negatives for hypoglycemia include no confusion, headaches, pallor or seizures. Associated symptoms include fatigue, polydipsia and polyuria. Pertinent negatives for diabetes include no chest pain and no polyphagia. There are no  hypoglycemic complications. Symptoms are worsening. There are no diabetic complications. Risk factors for coronary artery disease include diabetes mellitus, dyslipidemia, hypertension, family history, post-menopausal and sedentary lifestyle. Current diabetic treatments: She is currently on Farxiga 10 mg p.o. daily at breakfast, Janumet 50/1000 mg twice daily. Her weight is fluctuating minimally. She is following a generally unhealthy diet. When asked about  meal planning, she reported none. She has not had a previous visit with a dietitian. She rarely participates in exercise. (She did not bring any logs nor meter to review today.  Her point-of-care A1c is 10.8%.) An ACE inhibitor/angiotensin II receptor blocker is being taken.  Hypertension This is a chronic problem. The current episode started more than 1 year ago. The problem is controlled. Pertinent negatives include no chest pain, headaches, palpitations or shortness of breath. Risk factors for coronary artery disease include diabetes mellitus, sedentary lifestyle, post-menopausal state, dyslipidemia and family history. Past treatments include ACE inhibitors. The current treatment provides moderate improvement.  Hyperlipidemia This is a chronic problem. The current episode started more than 1 year ago. Exacerbating diseases include diabetes. Pertinent negatives include no chest pain, myalgias or shortness of breath. Current antihyperlipidemic treatment includes statins. Risk factors for coronary artery disease include diabetes mellitus, dyslipidemia, hypertension, a sedentary lifestyle and post-menopausal.     Review of Systems  Constitutional:  Positive for fatigue. Negative for chills, fever and unexpected weight change.  HENT:  Negative for trouble swallowing and voice change.   Eyes:  Negative for visual disturbance.  Respiratory:  Negative for cough, shortness of breath and wheezing.   Cardiovascular:  Negative for chest pain, palpitations and  leg swelling.  Gastrointestinal:  Negative for diarrhea, nausea and vomiting.  Endocrine: Positive for polydipsia and polyuria. Negative for cold intolerance, heat intolerance and polyphagia.  Musculoskeletal:  Negative for arthralgias and myalgias.  Skin:  Negative for color change, pallor, rash and wound.  Neurological:  Negative for seizures and headaches.  Psychiatric/Behavioral:  Negative for confusion and suicidal ideas.     Objective:       06/12/2022   10:44 AM 04/04/2022    8:18 AM 03/17/2022    3:30 AM  Vitals with BMI  Height _0  _1    Weight 170 lbs 177 lbs 5 oz   BMI 10.30 13.14   Systolic 388 875 797  Diastolic 74 85 84  Pulse 80 82 77    BP 138/74   Pulse 80   Ht _2  (1.651 m)   Wt 170 lb (77.1 kg)   BMI 28.29 kg/m   Wt Readings from Last 3 Encounters:  06/12/22 170 lb (77.1 kg)  04/04/22 177 lb 5 oz (80.4 kg)  03/16/22 172 lb (78 kg)     Physical Exam Constitutional:      Appearance: She is well-developed.  HENT:     Head: Normocephalic and atraumatic.  Neck:     Thyroid: No thyromegaly.     Trachea: No tracheal deviation.  Cardiovascular:     Rate and Rhythm: Normal rate and regular rhythm.  Pulmonary:     Effort: Pulmonary effort is normal.     Breath sounds: Normal breath sounds.  Abdominal:     General: Bowel sounds are normal.     Palpations: Abdomen is soft.     Tenderness: There is no abdominal tenderness. There is no guarding.  Musculoskeletal:        General: Normal range of motion.     Cervical back: Normal range of motion and neck supple.  Skin:    General: Skin is warm and dry.     Coloration: Skin is not pale.     Findings: No erythema or rash.  Neurological:     Mental Status: She is alert and oriented to person, place, and time.     Cranial Nerves: No cranial nerve deficit.  Coordination: Coordination normal.     Deep Tendon Reflexes: Reflexes are normal and symmetric.  Psychiatric:        Judgment: Judgment  normal.       CMP ( most recent) CMP     Component Value Date/Time   NA 135 03/16/2022 2002   NA 139 09/06/2021 1233   K 4.0 03/16/2022 2002   CL 103 03/16/2022 2002   CO2 19 (L) 03/16/2022 1948   GLUCOSE 286 (H) 03/16/2022 2002   BUN 18 03/16/2022 2002   BUN 16 09/06/2021 1233   CREATININE 0.60 03/16/2022 2002   CALCIUM 9.3 03/16/2022 1948   PROT 6.7 03/16/2022 1948   PROT 6.7 02/13/2022 1025   ALBUMIN 3.9 03/16/2022 1948   ALBUMIN 4.6 02/13/2022 1025   AST 62 (H) 03/16/2022 1948   ALT 37 03/16/2022 1948   ALKPHOS 55 03/16/2022 1948   BILITOT 0.3 03/16/2022 1948   BILITOT 0.3 02/13/2022 1025   GFRNONAA >60 03/16/2022 1948   GFRAA 52 (L) 07/17/2017 2118     Diabetic Labs (most recent): Lab Results  Component Value Date   HGBA1C 10.8 (A) 06/12/2022   HGBA1C 11.3 (H) 02/13/2022     Lipid Panel ( most recent) Lipid Panel     Component Value Date/Time   CHOL 129 02/13/2022 1025   TRIG 152 (H) 02/13/2022 1025   HDL 40 02/13/2022 1025   CHOLHDL 3.2 02/13/2022 1025   CHOLHDL 3.4 12/30/2021 1445   VLDL 49 (H) 12/30/2021 1445   LDLCALC 63 02/13/2022 1025   LABVLDL 26 02/13/2022 1025      Assessment & Plan:   1. Type 2 diabetes mellitus without complication, without long-term current use of insulin ([place])   - Erika Ramos has currently uncontrolled symptomatic type 2 DM since  76 years of age,  with most recent A1c of 10.8%. Recent labs reviewed. - I had a long discussion with her about the possible risk factors and  the pathology behind its diabetes and its complications. -her diabetes is complicated by sedentary life, dietary indiscretions and she remains at a high risk for more acute and chronic complications which include CAD, CVA, CKD, retinopathy, and neuropathy. These are all discussed in detail with her.  - I discussed all available options of managing her diabetes including de-escalation of medications. I have counseled her on Food as Medicine by  adopting a Whole Food , Plant Predominant  ( WFPP) nutrition as recommended by [employer] of Lifestyle Medicine. Patient is encouraged to switch to  unprocessed or minimally processed  complex starch, adequate protein intake (mainly plant source), minimal liquid fat, plenty of fruits, and vegetables. -  she is advised to stick to a routine mealtimes to eat 3 complete meals a day and snack only when necessary ( to snack only to correct hypoglycemia BG <70 day time or <100 at night).   - she acknowledges that there is a room for improvement in her food and drink choices. - Further Specific Suggestion is made for her to avoid simple carbohydrates  from her diet including Cakes, Sweet Desserts, Ice Cream, Soda (diet and regular), Sweet Tea, Candies, Chips, Cookies, Store Bought Juices, Alcohol ,  Artificial Sweeteners,  Coffee Creamer, and "Sugar-free" Products. This will help patient to have more stable blood glucose profile and potentially avoid unintended weight gain.  The following Lifestyle Medicine recommendations according to [place] of Lifestyle Medicine [facility]) were discussed and offered to patient and she agrees to start the  journey:  A.  Whole Foods, Plant-based plate comprising of fruits and vegetables, plant-based proteins, whole-grain carbohydrates was discussed in detail with the patient.   A list for source of those nutrients were also provided to the patient.  Patient will use only water or unsweetened tea for hydration. B.  The need to stay away from risky substances including alcohol, smoking; obtaining 7 to 9 hours of restorative sleep, at least 150 minutes of moderate intensity exercise weekly, the importance of healthy social connections,  and stress reduction techniques were discussed. C.  A full color page of  Calorie density of various food groups per pound showing examples of each food groups was provided to the patient.  - she will be scheduled with Jearld Fenton,  RDN, CDE for individualized diabetes education.  - I have approached her with the following individualized plan to manage  her diabetes and patient agrees:   -In light of her prevailing glycemic burden, she will need insulin treatment in order for her to achieve control of diabetes to target.  However, this patient shows interest to avoid insulin and will be engaged with lifestyle medicine.    She is approached to start monitoring blood glucose 4 times a day-before meals and at bedtime and return in 10 days with her meter and logs. - In the meantime, she is advised to continue Farxiga 10 mg p.o. daily at breakfast, continue Janumet 50/1000 mg p.o. twice daily with breakfast and supper.    - she is encouraged to call clinic for blood glucose levels less than 70 or above 300 mg /dl. - Specific targets for  A1c;  LDL, HDL,  and Triglycerides were discussed with the patient.  2) Blood Pressure /Hypertension:  her blood pressure is  controlled to target.   she is advised to continue her current medications including ramipril 10 mg p.o. daily with breakfast . 3) Lipids/Hyperlipidemia:   Review of her recent lipid panel showed  controlled  LDL at 63 .  she  is advised to continue    Crestor 40 mg daily at bedtime.  Side effects and precautions discussed with her.  4)  Weight/Diet:  Body mass index is 28.29 kg/m.  -   she is  a candidate for some weight loss. I discussed with her the fact that loss of 5 - 10% of her  current body weight will have the most impact on her diabetes management.  The above detailed  AC[facility] recommendations for nutrition, exercise, sleep, social life, avoidance of risky substances, the need for restorative sleep   information will also detailed on discharge instructions.  5) Chronic Care/Health Maintenance:  -she  is on ACEI/ARB and Statin medications and  is encouraged to initiate and continue to follow up with Ophthalmology, Dentist,  Podiatrist at least yearly or according to  recommendations, and advised to   stay away from smoking. I have recommended yearly flu vaccine and pneumonia vaccine at least every 5 years; moderate intensity exercise for up to 150 minutes weekly; and  sleep for 7- 9 hours a day.  - she is  advised to maintain close follow up with Sharilyn Sites, MD for primary care needs, as well as her other providers for optimal and coordinated care.   I spent 62 minutes in the care of the patient today including review of labs from [facility], Lipids, Thyroid Function, Hematology (current and previous including abstractions from other facilities); face-to-face time discussing  her blood glucose readings/logs, discussing hypoglycemia and hyperglycemia  episodes and symptoms, medications doses, her options of short and long term treatment based on the latest standards of care / guidelines;  discussion about incorporating lifestyle medicine;  and documenting the encounter. Risk reduction counseling performed per USPSTF guidelines to reduce  obesity and cardiovascular risk factors.      Please refer to Patient Instructions for Blood Glucose Monitoring and Insulin/Medications Dosing Guide"  in media tab for additional information. Please  also refer to " Patient Self Inventory" in the Media  tab for reviewed elements of pertinent patient history.  Erika Ramos participated in the discussions, expressed understanding, and voiced agreement with the above plans.  All questions were answered to her satisfaction. she is encouraged to contact clinic should she have any questions or concerns prior to her return visit.   Follow up plan: - Return in about 10 days (around 06/22/2022) for F/U with Meter/CGM [facility] Only - no Labs.  Glade Lloyd, MD [facility] [address] [place],  [address] Phone: [phone]  Fax: [phone]    06/12/2022, 4:25 PM  This note was partially dictated with voice recognition  software. Similar sounding words can be transcribed inadequately or may not  be corrected upon review.

## 2022-06-12 NOTE — Patient Instructions (Signed)
                                     Advice for Weight Management  -For most of us the best way to lose weight is by diet management. Generally speaking, diet management means consuming less calories intentionally which over time brings about progressive weight loss.  This can be achieved more effectively by avoiding ultra processed carbohydrates, processed meats, unhealthy fats.    It is critically important to know your numbers: how much calorie you are consuming and how much calorie you need. More importantly, our carbohydrates sources should be unprocessed naturally occurring  complex starch food items.  It is always important to balance nutrition also by  appropriate intake of proteins (mainly plant-based), healthy fats/oils, plenty of fruits and vegetables.   -The American College of Lifestyle Medicine (ACL M) recommends nutrition derived mostly from Whole Food, Plant Predominant Sources example an apple instead of applesauce or apple pie. Eat Plenty of vegetables, Mushrooms, fruits, Legumes, Whole Grains, Nuts, seeds in lieu of processed meats, processed snacks/pastries red meat, poultry, eggs.  Use only water or unsweetened tea for hydration.  The College also recommends the need to stay away from risky substances including alcohol, smoking; obtaining 7-9 hours of restorative sleep, at least 150 minutes of moderate intensity exercise weekly, importance of healthy social connections, and being mindful of stress and seek help when it is overwhelming.    -Sticking to a routine mealtime to eat 3 meals a day and avoiding unnecessary snacks is shown to have a big role in weight control. Under normal circumstances, the only time we burn stored energy is when we are hungry, so allow  some hunger to take place- hunger means no food between appropriate meal times, only water.  It is not advisable to starve.   -It is better to avoid simple carbohydrates including:  Cakes, Sweet Desserts, Ice Cream, Soda (diet and regular), Sweet Tea, Candies, Chips, Cookies, Store Bought Juices, Alcohol in Excess of  1-2 drinks a day, Lemonade,  Artificial Sweeteners, Doughnuts, Coffee Creamers, "Sugar-free" Products, etc, etc.  This is not a complete list.....    -Consulting with certified diabetes educators is proven to provide you with the most accurate and current information on diet.  Also, you may be  interested in discussing diet options/exchanges , we can schedule a visit with Erika Ramos, RDN, CDE for individualized nutrition education.  -Exercise: If you are able: 30 -60 minutes a day ,4 days a week, or 150 minutes of moderate intensity exercise weekly.    The longer the better if tolerated.  Combine stretch, strength, and aerobic activities.  If you were told in the past that you have high risk for cardiovascular diseases, or if you are currently symptomatic, you may seek evaluation by your heart doctor prior to initiating moderate to intense exercise programs.                                  Additional Care Considerations for Diabetes/Prediabetes   -Diabetes  is a chronic disease.  The most important care consideration is regular follow-up with your diabetes care provider with the goal being avoiding or delaying its complications and to take advantage of advances in medications and technology.  If appropriate actions are taken early enough, type 2 diabetes can even be   reversed.  Seek information from the right source.  - Whole Food, Plant Predominant Nutrition is highly recommended: Eat Plenty of vegetables, Mushrooms, fruits, Legumes, Whole Grains, Nuts, seeds in lieu of processed meats, processed snacks/pastries red meat, poultry, eggs as recommended by American College of  Lifestyle Medicine (ACLM).  -Type 2 diabetes is known to coexist with other important comorbidities such as high blood pressure and high cholesterol.  It is critical to control not only the  diabetes but also the high blood pressure and high cholesterol to minimize and delay the risk of complications including coronary artery disease, stroke, amputations, blindness, etc.  The good news is that this diet recommendation for type 2 diabetes is also very helpful for managing high cholesterol and high blood blood pressure.  - Studies showed that people with diabetes will benefit from a class of medications known as ACE inhibitors and statins.  Unless there are specific reasons not to be on these medications, the standard of care is to consider getting one from these groups of medications at an optimal doses.  These medications are generally considered safe and proven to help protect the heart and the kidneys.    - People with diabetes are encouraged to initiate and maintain regular follow-up with eye doctors, foot doctors, dentists , and if necessary heart and kidney doctors.     - It is highly recommended that people with diabetes quit smoking or stay away from smoking, and get yearly  flu vaccine and pneumonia vaccine at least every 5 years.  See above for additional recommendations on exercise, sleep, stress management , and healthy social connections.      

## 2022-06-13 DIAGNOSIS — E119 Type 2 diabetes mellitus without complications: Secondary | ICD-10-CM | POA: Insufficient documentation

## 2022-06-18 ENCOUNTER — Other Ambulatory Visit: Payer: Self-pay

## 2022-06-18 DIAGNOSIS — E119 Type 2 diabetes mellitus without complications: Secondary | ICD-10-CM

## 2022-06-18 MED ORDER — ACCU-CHEK GUIDE ME W/DEVICE KIT: PACK | 0 refills | Status: AC

## 2022-06-24 ENCOUNTER — Ambulatory Visit: Payer: Medicare HMO | Admitting: "Endocrinology

## 2022-06-30 ENCOUNTER — Other Ambulatory Visit: Payer: Self-pay

## 2022-06-30 ENCOUNTER — Telehealth: Payer: Self-pay | Admitting: "Endocrinology

## 2022-06-30 DIAGNOSIS — E119 Type 2 diabetes mellitus without complications: Secondary | ICD-10-CM

## 2022-06-30 MED ORDER — ACCU-CHEK GUIDE VI STRP: ORAL_STRIP | 2 refills | Status: DC

## 2022-06-30 NOTE — Telephone Encounter (Signed)
Rx refill for test strips sent to [facility] in [place] on [address]

## 2022-06-30 NOTE — Telephone Encounter (Signed)
Pt does not have any strips for her meter. She said she uses [facility], [facility] on [address]

## 2022-07-01 DIAGNOSIS — Z6829 Body mass index (BMI) 29.0-29.9, adult: Secondary | ICD-10-CM | POA: Diagnosis not present

## 2022-07-01 DIAGNOSIS — N390 Urinary tract infection, site not specified: Secondary | ICD-10-CM | POA: Diagnosis not present

## 2022-07-01 DIAGNOSIS — E6609 Other obesity due to excess calories: Secondary | ICD-10-CM | POA: Diagnosis not present

## 2022-07-18 ENCOUNTER — Ambulatory Visit (INDEPENDENT_AMBULATORY_CARE_PROVIDER_SITE_OTHER): Payer: Medicare HMO | Admitting: "Endocrinology

## 2022-07-18 ENCOUNTER — Encounter: Payer: Self-pay | Admitting: "Endocrinology

## 2022-07-18 VITALS — BP 100/54 | HR 76 | Ht 65.0 in | Wt 169.0 lb

## 2022-07-18 DIAGNOSIS — I10 Essential (primary) hypertension: Secondary | ICD-10-CM | POA: Diagnosis not present

## 2022-07-18 DIAGNOSIS — E119 Type 2 diabetes mellitus without complications: Secondary | ICD-10-CM

## 2022-07-18 DIAGNOSIS — E782 Mixed hyperlipidemia: Secondary | ICD-10-CM | POA: Diagnosis not present

## 2022-07-18 NOTE — Patient Instructions (Signed)

## 2022-07-18 NOTE — Progress Notes (Signed)
Endocrinology Consult Note       07/18/2022, 1:04 PM   Subjective:    Patient ID: Erika Ramos, female    DOB: 04-06-1946.  Erika Ramos is being seen in consultation for management of currently uncontrolled symptomatic diabetes requested by  Sharilyn Sites, MD.   Past Medical History:  Diagnosis Date   Breast cancer [facility])    Breast cancer, stage 2, right ([place]) 09/06/2009   Qualifier: Diagnosis of  By: Orville Govern, CMA, Carol     Coronary artery disease    Diabetes mellitus without complication ([place])    Fatty liver disease, nonalcoholic    GERD (gastroesophageal reflux disease)    Hypertension    PONV (postoperative nausea and vomiting)    Nausea/ Vomitting- last time 2010   Stented coronary artery     Past Surgical History:  Procedure Laterality Date   ABDOMINAL HYSTERECTOMY     BREAST SURGERY  March 2010   cataract  Right 09/2012   CATARACT EXTRACTION  01/01/2018   left eye   COLONOSCOPY WITH PROPOFOL N/A 07/05/2021   Procedure: COLONOSCOPY WITH PROPOFOL;  Surgeon: Eloise Harman, DO;  [facility];  Service: Endoscopy;  Laterality: N/A;  8:45am   FRACTURE SURGERY     wrist  right   LEFT HEART CATH AND CORONARY ANGIOGRAPHY N/A 09/10/2021   Procedure: LEFT HEART CATH AND CORONARY ANGIOGRAPHY;  Surgeon: Belva Crome, MD;  Location: [facility];  Service: Cardiovascular;  Laterality: N/A;   PARS PLANA VITRECTOMY  07/03/2011   Procedure: PARS PLANA VITRECTOMY WITH 25 GAUGE;  Surgeon: Hayden Pedro, MD;  Location: [facility];  Service: Ophthalmology;  Laterality: Right;  MEMBRANE PEEL, HEAD SCOPE LASER, GAS   POLYPECTOMY  07/05/2021   Procedure: POLYPECTOMY;  Surgeon: Eloise Harman, DO;  [facility];  Service: Endoscopy;;   RETINAL DETACHMENT SURGERY     RETINAL DETACHMENT SURGERY  07/03/11   right   TONSILLECTOMY      Social History   Socioeconomic History    Marital status: Widowed    Spouse name: Not on file   Number of children: Not on file   Years of education: Not on file   Highest education level: Not on file  Occupational History   Not on file  Tobacco Use   Smoking status: Never   Smokeless tobacco: Never  Vaping Use   Vaping Use: Never used  Substance and Sexual Activity   Alcohol use: No   Drug use: No   Sexual activity: Not on file    Comment: single-2 grown children  Other Topics Concern   Not on file  Social History Narrative   Not on file   Social Determinants of Health   Financial Resource Strain: Not on file  Food Insecurity: Not on file  Transportation Needs: Not on file  Physical Activity: Not on file  Stress: Not on file  Social Connections: Not on file    Family History  Problem Relation Age of Onset   Heart disease Mother    Ovarian cancer Mother    Stroke Father  Diabetes Sister    Diabetes Brother    Diabetes Sister    Heart disease Brother    Cancer Brother        spine   Anesthesia problems Neg Hx     Outpatient Encounter Medications as of 07/18/2022  Medication Sig   Accu-Chek Softclix Lancets lancets Use to check blood glucose four times daily as instructed   aspirin EC 81 MG tablet Take 81 mg by mouth daily.   Biotin 5000 MCG CHEW Chew by mouth daily. Per patient taking 2 chews daily   Blood Glucose Monitoring Suppl (ACCU-CHEK GUIDE ME) w/Device KIT Use to check blood glucose four times daily   Calcium-Vitamin D-Vitamin K 500-500-40 MG-UNT-MCG CHEW Chew 1 each by mouth daily.   carvedilol (COREG) 12.5 MG tablet Take 1 tablet (12.5 mg total) by mouth 2 (two) times daily with a meal.   Cholecalciferol (VITAMIN D3 GUMMIES ADULT PO) Take 2,000 Units by mouth daily.   Cyanocobalamin (VITAMIN B-12 PO) Take 1 tablet by mouth daily. (Patient not taking: Reported on 06/12/2022)   dapagliflozin propanediol (FARXIGA) 10 MG TABS tablet Take 10 mg by mouth daily.   escitalopram (LEXAPRO) 10 MG  tablet Take 10 mg by mouth daily.   fenofibrate (TRICOR) 145 MG tablet Take 1 tablet (145 mg total) by mouth daily.   glucose blood (ACCU-CHEK GUIDE) test strip Use to check blood glucose four times daily as instructed   JANUMET 50-1000 MG tablet Take 1 tablet by mouth 2 (two) times daily with a meal.    Multiple Vitamins-Minerals (CENTRUM SILVER ULTRA WOMENS PO) Take 2 tablets by mouth daily.   MYRBETRIQ 50 MG TB24 tablet Take 50 mg by mouth daily.   nitroGLYCERIN (NITROSTAT) 0.4 MG SL tablet Place 1 tablet (0.4 mg total) under the tongue every 5 (five) minutes as needed for chest pain.   Omega-3 Fatty Acids (FISH OIL PO) Take 1 capsule by mouth daily at 6 (six) AM. Per patient taking daily not taking at 6:00 am   omeprazole (PRILOSEC) 20 MG capsule Take 20 mg by mouth daily.   ramipril (ALTACE) 10 MG capsule Take 10 mg by mouth daily.   rosuvastatin (CRESTOR) 40 MG tablet Take 1 tablet (40 mg total) by mouth daily.   vitamin C (ASCORBIC ACID) 250 MG tablet Take 500 mg by mouth daily.   zinc gluconate 50 MG tablet Take 50 mg by mouth daily.   No facility-administered encounter medications on file as of 07/18/2022.    ALLERGIES: Allergies  Allergen Reactions   Clopidogrel Bisulfate Hives   Oxycontin [Oxycodone Hcl] Nausea And Vomiting    VACCINATION STATUS: Immunization History  Administered Date(s) Administered   Influenza,inj,Quad PF,6+ Mos 04/03/2016   PFIZER(Purple Top)SARS-COV-2 Vaccination 07/31/2019, 08/24/2019    Diabetes She presents for her follow-up diabetic visit. She has type 2 diabetes mellitus. Onset time: She was diagnosed at approximate age of 53 years. Her disease course has been improving. There are no hypoglycemic associated symptoms. Pertinent negatives for hypoglycemia include no confusion, headaches, pallor or seizures. Associated symptoms include fatigue and polyuria. Pertinent negatives for diabetes include no chest pain, no polydipsia and no polyphagia. There  are no hypoglycemic complications. Symptoms are improving. There are no diabetic complications. Risk factors for coronary artery disease include diabetes mellitus, dyslipidemia, hypertension, family history, post-menopausal and sedentary lifestyle. Current diabetic treatments: She is currently on Farxiga 10 mg p.o. daily at breakfast, Janumet 50/1000 mg twice daily. Her weight is stable. She is following a generally  unhealthy diet. When asked about meal planning, she reported none. She has not had a previous visit with a dietitian. She rarely participates in exercise. Her home blood glucose trend is decreasing steadily. Her breakfast blood glucose range is generally 140-180 mg/dl. Her lunch blood glucose range is generally 140-180 mg/dl. Her dinner blood glucose range is generally 140-180 mg/dl. Her bedtime blood glucose range is generally 140-180 mg/dl. Her overall blood glucose range is 140-180 mg/dl. (She did bring her meter and logs averaging 159 over the last 7 days out of 17 readings, 176 over the last 14 days out of 35 readings.  This significant improvement considering her A1c during her last visit of 10.8%.  She made significant changes in her diet.   ) An ACE inhibitor/angiotensin II receptor blocker is being taken.  Hypertension This is a chronic problem. The current episode started more than 1 year ago. The problem is controlled. Pertinent negatives include no chest pain, headaches, palpitations or shortness of breath. Risk factors for coronary artery disease include diabetes mellitus, sedentary lifestyle, post-menopausal state, dyslipidemia and family history. Past treatments include ACE inhibitors. The current treatment provides moderate improvement.  Hyperlipidemia This is a chronic problem. The current episode started more than 1 year ago. Exacerbating diseases include diabetes. Pertinent negatives include no chest pain, myalgias or shortness of breath. Current antihyperlipidemic treatment  includes statins. Risk factors for coronary artery disease include diabetes mellitus, dyslipidemia, hypertension, a sedentary lifestyle and post-menopausal.     Review of Systems  Constitutional:  Positive for fatigue. Negative for chills, fever and unexpected weight change.  HENT:  Negative for trouble swallowing and voice change.   Eyes:  Negative for visual disturbance.  Respiratory:  Negative for cough, shortness of breath and wheezing.   Cardiovascular:  Negative for chest pain, palpitations and leg swelling.  Gastrointestinal:  Negative for diarrhea, nausea and vomiting.  Endocrine: Positive for polyuria. Negative for cold intolerance, heat intolerance, polydipsia and polyphagia.  Musculoskeletal:  Negative for arthralgias and myalgias.  Skin:  Negative for color change, pallor, rash and wound.  Neurological:  Negative for seizures and headaches.  Psychiatric/Behavioral:  Negative for confusion and suicidal ideas.     Objective:       07/18/2022   10:52 AM 06/12/2022   10:44 AM 04/04/2022    8:18 AM  Vitals with BMI  Height '5\' 5"'$  '5\' 5"'$  '5\' 5"'$   Weight 169 lbs 170 lbs 177 lbs 5 oz  BMI 28.12 16.01 09.32  Systolic 355 732 202  Diastolic 54 74 85  Pulse 76 80 82    BP (!) 100/54   Pulse 76   Ht '5\' 5"'$  (1.651 m)   Wt 169 lb (76.7 kg)   BMI 28.12 kg/m   Wt Readings from Last 3 Encounters:  07/18/22 169 lb (76.7 kg)  06/12/22 170 lb (77.1 kg)  04/04/22 177 lb 5 oz (80.4 kg)      CMP ( most recent) CMP     Component Value Date/Time   NA 135 03/16/2022 2002   NA 139 09/06/2021 1233   K 4.0 03/16/2022 2002   CL 103 03/16/2022 2002   CO2 19 (L) 03/16/2022 1948   GLUCOSE 286 (H) 03/16/2022 2002   BUN 18 03/16/2022 2002   BUN 16 09/06/2021 1233   CREATININE 0.60 03/16/2022 2002   CALCIUM 9.3 03/16/2022 1948   PROT 6.7 03/16/2022 1948   PROT 6.7 02/13/2022 1025   ALBUMIN 3.9 03/16/2022 1948   ALBUMIN 4.6 02/13/2022  1025   AST 62 (H) 03/16/2022 1948   ALT 37  03/16/2022 1948   ALKPHOS 55 03/16/2022 1948   BILITOT 0.3 03/16/2022 1948   BILITOT 0.3 02/13/2022 1025   GFRNONAA >60 03/16/2022 1948   GFRAA 52 (L) 07/17/2017 2118     Diabetic Labs (most recent): Lab Results  Component Value Date   HGBA1C 10.8 (A) 06/12/2022   HGBA1C 11.3 (H) 02/13/2022     Lipid Panel ( most recent) Lipid Panel     Component Value Date/Time   CHOL 129 02/13/2022 1025   TRIG 152 (H) 02/13/2022 1025   HDL 40 02/13/2022 1025   CHOLHDL 3.2 02/13/2022 1025   CHOLHDL 3.4 12/30/2021 1445   VLDL 49 (H) 12/30/2021 1445   LDLCALC 63 02/13/2022 1025   LABVLDL 26 02/13/2022 1025      Assessment & Plan:   1. Type 2 diabetes mellitus without complication, without long-term current use of insulin ([place])   - Erika Ramos has currently uncontrolled symptomatic type 2 DM since  77 years of age. She did bring her meter and logs averaging 159 over the last 7 days out of 17 readings, 176 over the last 14 days out of 35 readings.  This significant improvement considering her A1c during her last visit of 10.8%.  She made significant changes in her diet.    - I had a long discussion with her about the possible risk factors and  the pathology behind its diabetes and its complications. -her diabetes is complicated by sedentary life, dietary indiscretions and she remains at a high risk for more acute and chronic complications which include CAD, CVA, CKD, retinopathy, and neuropathy. These are all discussed in detail with her.  - I discussed all available options of managing her diabetes including de-escalation of medications. I have counseled her on Food as Medicine by adopting a Whole Food , Plant Predominant  ( WFPP) nutrition as recommended by [employer] of Lifestyle Medicine. Patient is encouraged to switch to  unprocessed or minimally processed  complex starch, adequate protein intake (mainly plant source), minimal liquid fat, plenty of fruits, and vegetables. -   she is advised to stick to a routine mealtimes to eat 3 complete meals a day and snack only when necessary ( to snack only to correct hypoglycemia BG <70 day time or <100 at night).  Patient made significant change in her diet and benefiting from lifestyle medicine. In light of her desire to avoid injectable treatment for diabetes she remains a good candidate for lifestyle medicine. - she acknowledges that there is a room for improvement in her food and drink choices. - Suggestion is made for her to avoid simple carbohydrates  from her diet including Cakes, Sweet Desserts, Ice Cream, Soda (diet and regular), Sweet Tea, Candies, Chips, Cookies, Store Bought Juices, Alcohol , Artificial Sweeteners,  Coffee Creamer, and "Sugar-free" Products, Lemonade. This will help patient to have more stable blood glucose profile and potentially avoid unintended weight gain.  The following Lifestyle Medicine recommendations according to [place]  [employer]) were discussed and and offered to patient and she  agrees to start the journey:  A. Whole Foods, Plant-Based Nutrition comprising of fruits and vegetables, plant-based proteins, whole-grain carbohydrates was discussed in detail with the patient.   A list for source of those nutrients were also provided to the patient.  Patient will use only water or unsweetened tea for hydration. B.  The need to stay away  from risky substances including alcohol, smoking; obtaining 7 to 9 hours of restorative sleep, at least 150 minutes of moderate intensity exercise weekly, the importance of healthy social connections,  and stress management techniques were discussed. C.  A full color page of  Calorie density of various food groups per pound showing examples of each food groups was provided to the patient.    - she will be scheduled with Jearld Fenton, RDN, CDE for individualized diabetes education.  - I have approached her with the following  individualized plan to manage  her diabetes and patient agrees:   -In light of her presentation with near target glycemic profile, she will not need insulin for now.  She is willing and advised to continue monitoring blood glucose twice a day-daily before breakfast and at bedtime.    - In the meantime, she is advised to continue Farxiga 10 mg p.o. daily at breakfast, continue Janumet 50/1000 mg p.o. twice daily with breakfast and supper.    - she is encouraged to call clinic for blood glucose levels less than 70 or above 300 mg /dl. - Specific targets for  A1c;  LDL, HDL,  and Triglycerides were discussed with the patient.  2) Blood Pressure /Hypertension:   Her blood pressure is controlled to target.   she is advised to continue her current medications including ramipril 10 mg p.o. daily with breakfast . 3) Lipids/Hyperlipidemia:   Review of her recent lipid panel showed  controlled  LDL at 63 .  she  is advised to continue   Crestor 40 mg p.o. daily at bedtime.  Side effects and precautions discussed with her.  4)  Weight/Diet:  Body mass index is 28.12 kg/m.  -   she is  a candidate for some weight loss. I discussed with her the fact that loss of 5 - 10% of her  current body weight will have the most impact on her diabetes management.  The above detailed  ACLM recommendations for nutrition, exercise, sleep, social life, avoidance of risky substances, the need for restorative sleep   information will also detailed on discharge instructions.  5) Chronic Care/Health Maintenance:  -she  is on ACEI/ARB and Statin medications and  is encouraged to initiate and continue to follow up with Ophthalmology, Dentist,  Podiatrist at least yearly or according to recommendations, and advised to   stay away from smoking. I have recommended yearly flu vaccine and pneumonia vaccine at least every 5 years; moderate intensity exercise for up to 150 minutes weekly; and  sleep for 7- 9 hours a day.  - she is   advised to maintain close follow up with Sharilyn Sites, MD for primary care needs, as well as her other providers for optimal and coordinated care.   I spent 26 minutes in the care of the patient today including review of labs from [facility], Lipids, Thyroid Function, Hematology (current and previous including abstractions from other facilities); face-to-face time discussing  her blood glucose readings/logs, discussing hypoglycemia and hyperglycemia episodes and symptoms, medications doses, her options of short and long term treatment based on the latest standards of care / guidelines;  discussion about incorporating lifestyle medicine;  and documenting the encounter. Risk reduction counseling performed per USPSTF guidelines to reduce cardiovascular risk factors.     Please refer to Patient Instructions for Blood Glucose Monitoring and Insulin/Medications Dosing Guide"  in media tab for additional information. Please  also refer to " Patient Self Inventory" in the Media  tab for  reviewed elements of pertinent patient history.  Erika Ramos participated in the discussions, expressed understanding, and voiced agreement with the above plans.  All questions were answered to her satisfaction. she is encouraged to contact clinic should she have any questions or concerns prior to her return visit.   Follow up plan: - Return in about 3 months (around 10/17/2022) for F/U with Pre-visit Labs, Meter/CGM/Logs, A1c here.  Glade Lloyd, MD [facility] [address] [place], [place] [address] Phone: [phone]  Fax: [phone]    07/18/2022, 1:04 PM  This note was partially dictated with voice recognition software. Similar sounding words can be transcribed inadequately or may not  be corrected upon review.

## 2022-07-24 NOTE — Progress Notes (Signed)
Cardiology Office Note:    Date:  07/31/2022   ID:  Erika Ramos, DOB 01/18/1946, MRN [id number]  PCP:  Sharilyn Sites, MD   [facility] Providers Cardiologist:  Jenkins Rouge, MD     Referring MD: Sharilyn Sites, MD   Chief Complaint: chest pain  History of Present Illness:    Erika Ramos is a 77 y.o. female with a hx of CAD s/p PCI RCA/PTCA PDA in 2001, hyperlipidemia, breast cancer s/p lumpectomy followed by chemotherapy and radiation 2009, HTN, and diabetes.   08/26/21 at which time she reported substernal chest pain  Myoview completed on 09/05/21 was reported as abnormal  She underwent cardiac catheterization on 09/10/21 which revealed severe diffuse heavy coronary calcification, 30% distal left main, mid 50%, mid 65%, and distal 85% LAD. A branch of first diagonal contains 90% stenosis, 30% proximal circumflex, segment of proximal to mid 60% RCA, mid to distal PDA 90% stenosis, widely patent distal RCA stent with diffuse 30% narrowing, normal LV function and LVEDP. Current most severe lesions felt to be potentially approachable but would carry a higher than usual complication risk and in absence of convincing symptoms, no clinical utility in stenting.   Doing well on medical Rx with no classic angina Compliant with meds  Has atypical SSCP only at night when going to bed not with exertion   DM:  poorly controlled Seeing Dr Dorris Fetch A1c 10.8 06/12/22   She broke arm/nose with a fall in October when she was at a convention/study in [place]  No angina has good nitro not used   Past Medical History:  Diagnosis Date   Breast cancer ([place])    Breast cancer, stage 2, right ([place]) 09/06/2009   Qualifier: Diagnosis of  By: Orville Govern, CMA, Carol     Coronary artery disease    Diabetes mellitus without complication ([place])    Fatty liver disease, nonalcoholic    GERD (gastroesophageal reflux disease)    Hypertension    PONV (postoperative nausea and vomiting)    Nausea/ Vomitting-  last time 2010   Stented coronary artery     Past Surgical History:  Procedure Laterality Date   ABDOMINAL HYSTERECTOMY     BREAST SURGERY  March 2010   cataract  Right 09/2012   CATARACT EXTRACTION  01/01/2018   left eye   COLONOSCOPY WITH PROPOFOL N/A 07/05/2021   Procedure: COLONOSCOPY WITH PROPOFOL;  Surgeon: Eloise Harman, DO;  [facility];  Service: Endoscopy;  Laterality: N/A;  8:45am   FRACTURE SURGERY     wrist  right   LEFT HEART CATH AND CORONARY ANGIOGRAPHY N/A 09/10/2021   Procedure: LEFT HEART CATH AND CORONARY ANGIOGRAPHY;  Surgeon: Belva Crome, MD;  Location: [place] [facility];  Service: Cardiovascular;  Laterality: N/A;   PARS PLANA VITRECTOMY  07/03/2011   Procedure: PARS PLANA VITRECTOMY WITH 25 GAUGE;  Surgeon: Hayden Pedro, MD;  Location: [place];  Service: Ophthalmology;  Laterality: Right;  MEMBRANE PEEL, HEAD SCOPE LASER, GAS   POLYPECTOMY  07/05/2021   Procedure: POLYPECTOMY;  Surgeon: Eloise Harman, DO;  [facility];  Service: Endoscopy;;   RETINAL DETACHMENT SURGERY     RETINAL DETACHMENT SURGERY  07/03/11   right   TONSILLECTOMY      Current Medications: Current Meds  Medication Sig   Accu-Chek Softclix Lancets lancets Use to check blood glucose four times daily as instructed   aspirin EC 81 MG tablet Take 81 mg by  mouth daily.   Biotin 5000 MCG CHEW Chew by mouth daily. Per patient taking 2 chews daily   Blood Glucose Monitoring Suppl (ACCU-CHEK GUIDE ME) w/Device KIT Use to check blood glucose four times daily   Calcium-Vitamin D-Vitamin K 500-500-40 MG-UNT-MCG CHEW Chew 1 each by mouth daily.   carvedilol (COREG) 12.5 MG tablet Take 1 tablet (12.5 mg total) by mouth 2 (two) times daily with a meal.   Cholecalciferol (VITAMIN D3 GUMMIES ADULT PO) Take 2,000 Units by mouth daily.   Cyanocobalamin (VITAMIN B-12 PO) Take 1 tablet by mouth daily.   dapagliflozin propanediol (FARXIGA) 10 MG TABS tablet Take 10 mg by mouth  daily.   escitalopram (LEXAPRO) 10 MG tablet Take 10 mg by mouth daily.   fenofibrate (TRICOR) 145 MG tablet Take 1 tablet (145 mg total) by mouth daily.   glucose blood (ACCU-CHEK GUIDE) test strip Use to check blood glucose four times daily as instructed   JANUMET 50-1000 MG tablet Take 1 tablet by mouth 2 (two) times daily with a meal.    Multiple Vitamins-Minerals (CENTRUM SILVER ULTRA WOMENS PO) Take 2 tablets by mouth daily.   MYRBETRIQ 50 MG TB24 tablet Take 50 mg by mouth daily.   Omega-3 Fatty Acids (FISH OIL PO) Take 1 capsule by mouth daily at 6 (six) AM. Per patient taking daily not taking at 6:00 am   omeprazole (PRILOSEC) 20 MG capsule Take 20 mg by mouth daily.   ramipril (ALTACE) 10 MG capsule Take 10 mg by mouth daily.   rosuvastatin (CRESTOR) 40 MG tablet Take 1 tablet (40 mg total) by mouth daily.   vitamin C (ASCORBIC ACID) 250 MG tablet Take 500 mg by mouth daily.   zinc gluconate 50 MG tablet Take 50 mg by mouth daily.     Allergies:   Clopidogrel bisulfate and Oxycontin [oxycodone hcl]   Social History   Socioeconomic History   Marital status: Widowed    Spouse name: Not on file   Number of children: Not on file   Years of education: Not on file   Highest education level: Not on file  Occupational History   Not on file  Tobacco Use   Smoking status: Never   Smokeless tobacco: Never  Vaping Use   Vaping Use: Never used  Substance and Sexual Activity   Alcohol use: No   Drug use: No   Sexual activity: Not on file    Comment: single-2 grown children  Other Topics Concern   Not on file  Social History Narrative   Not on file   Social Determinants of Health   Financial Resource Strain: Not on file  Food Insecurity: Not on file  Transportation Needs: Not on file  Physical Activity: Not on file  Stress: Not on file  Social Connections: Not on file     Family History: The patient's family history includes Cancer in her brother; Diabetes in her  brother, sister, and sister; Heart disease in her brother and mother; Ovarian cancer in her mother; Stroke in her father. There is no history of Anesthesia problems.  ROS:   Please see the history of present illness.    + occasional episodes of chest discomfort + fatigue All other systems reviewed and are negative.  Labs/Other Studies Reviewed:    The following studies were reviewed today:  LHC 09/10/21  Severe diffuse heavy coronary calcification 30% distal left main Mid 50%, mid 65%, and distal 85% LAD.  A branch of the first diagonal  contains 90% stenosis. 30% proximal circumflex Segment of proximal to mid 60% RCA.  Widely patent distal LAD stent with diffuse 30% narrowing.  Mid to distal PDA 90% stenosis. Normal LV function and LVEDP.  EF estimated to be 60%.   RECOMMENDATIONS: Aggressive preventive therapy with LDL lowering less than 55. Anti-ischemic therapy as required to control symptoms.  The current symptoms are not ischemia related. The current most severe lesions are potentially approachable but would carry a higher than usual complication risk and in the absence of convincing symptoms, stenting does not have a clinical utility.  Diagnostic Dominance: Right Intervention   Exercise Myovue 09/05/21    Findings are consistent with prior inferior/inferoapical myocardial infarction with mild to moderate peri-infarct ischemia. Low to intermediate risk   No ST deviation was noted.   LV perfusion is abnormal. Large moderate intensity inferior/inferoapical defect with mild to moderate reversibility.   Left ventricular function is normal. End diastolic cavity size is normal.    Exercise Myoview 03/2016  The patient exercised following the Bruce protocol.  The patient reported shortness of breath during the stress test. The patient experienced no angina during the stress test.   The patient requested the test to be stopped. The test was stopped because the patient  complained of shortness of breath.   Heart rate demonstrated a normal response to exercise. Blood pressure demonstrated a hypertensive response to exercise. Overall, the patient's exercise capacity was normal.   85% of maximum heart rate was achieved after 3.3 minutes. Recovery time: 7 minutes. The patient's response to exercise was adequate for diagnosis    Recent Labs: 03/16/2022: ALT 37; BUN 18; Creatinine, Ser 0.60; Hemoglobin 13.3; Platelets 207; Potassium 4.0; Sodium 135  Recent Lipid Panel    Component Value Date/Time   CHOL 129 02/13/2022 1025   TRIG 152 (H) 02/13/2022 1025   HDL 40 02/13/2022 1025   CHOLHDL 3.2 02/13/2022 1025   CHOLHDL 3.4 12/30/2021 1445   VLDL 49 (H) 12/30/2021 1445   LDLCALC 63 02/13/2022 1025     Risk Assessment/Calculations:       Physical Exam:    VS:  BP (!) 148/80   Pulse 73   Ht '5\' 5"'$  (1.651 m)   Wt 166 lb (75.3 kg)   BMI 27.62 kg/m     Wt Readings from Last 3 Encounters:  07/31/22 166 lb (75.3 kg)  07/18/22 169 lb (76.7 kg)  06/12/22 170 lb (77.1 kg)    Affect appropriate Healthy:  appears stated age HEENT: normal Neck supple with no adenopathy JVP normal no bruits no thyromegaly Lungs clear with no wheezing and good diaphragmatic motion Heart:  S1/S2 no murmur, no rub, gallop or click PMI normal Abdomen: benighn, BS positve, no tenderness, no AAA no bruit.  No HSM or HJR Distal pulses intact with no bruits No edema Neuro non-focal Skin warm and dry No muscular weakness   EKG:   SR rate 91 LAD poor R wave progression   Diagnoses:    No diagnosis found.   Assessment and Plan:     CAD native without angina: Underwent LHC 09/10/21 recommended medical Rx Review of angiogram shows tightest lesions in small D1 branch and distal LAD. Moderate proximal RCA dx with patent distal RCA stent Continue coreg, ASA and statin Coreg dose increased post cath  .  Essential hypertension: Well controlled.  Continue current medications  and low sodium Dash type diet.    Hyperlipidemia LDL goal < 70: LDL 106 10/28/21. Zocor  changed to crestor LDL now at goal 63 02/13/22   4.  Diabetes:  poorly controlled Seeing Dr Dorris Fetch meds adjusted A1c 10.8  Disposition: F/U 6 months    Medication Adjustments/Labs and Tests Ordered: Current medicines are reviewed at length with the patient today.  Concerns regarding medicines are outlined above.  No orders of the defined types were placed in this encounter.  No orders of the defined types were placed in this encounter.   There are no Patient Instructions on file for this visit.   Signed, Jenkins Rouge, MD  07/31/2022 10:00 AM    [place]

## 2022-07-30 DIAGNOSIS — E119 Type 2 diabetes mellitus without complications: Secondary | ICD-10-CM | POA: Diagnosis not present

## 2022-07-30 DIAGNOSIS — H04201 Unspecified epiphora, right lacrimal gland: Secondary | ICD-10-CM | POA: Diagnosis not present

## 2022-07-30 DIAGNOSIS — H26492 Other secondary cataract, left eye: Secondary | ICD-10-CM | POA: Diagnosis not present

## 2022-07-30 DIAGNOSIS — H04122 Dry eye syndrome of left lacrimal gland: Secondary | ICD-10-CM | POA: Diagnosis not present

## 2022-07-30 DIAGNOSIS — H33303 Unspecified retinal break, bilateral: Secondary | ICD-10-CM | POA: Diagnosis not present

## 2022-07-30 DIAGNOSIS — H52223 Regular astigmatism, bilateral: Secondary | ICD-10-CM | POA: Diagnosis not present

## 2022-07-30 DIAGNOSIS — H43393 Other vitreous opacities, bilateral: Secondary | ICD-10-CM | POA: Diagnosis not present

## 2022-07-30 DIAGNOSIS — H5213 Myopia, bilateral: Secondary | ICD-10-CM | POA: Diagnosis not present

## 2022-07-30 DIAGNOSIS — H524 Presbyopia: Secondary | ICD-10-CM | POA: Diagnosis not present

## 2022-07-30 DIAGNOSIS — H35033 Hypertensive retinopathy, bilateral: Secondary | ICD-10-CM | POA: Diagnosis not present

## 2022-07-30 DIAGNOSIS — I10 Essential (primary) hypertension: Secondary | ICD-10-CM | POA: Diagnosis not present

## 2022-07-31 ENCOUNTER — Encounter: Payer: Self-pay | Admitting: Cardiovascular Disease

## 2022-07-31 ENCOUNTER — Ambulatory Visit: Payer: Medicare HMO | Attending: Cardiovascular Disease | Admitting: Cardiovascular Disease

## 2022-07-31 VITALS — BP 148/80 | HR 73 | Ht 65.0 in | Wt 166.0 lb

## 2022-07-31 DIAGNOSIS — I25118 Atherosclerotic heart disease of native coronary artery with other forms of angina pectoris: Secondary | ICD-10-CM | POA: Diagnosis not present

## 2022-07-31 DIAGNOSIS — E119 Type 2 diabetes mellitus without complications: Secondary | ICD-10-CM

## 2022-07-31 DIAGNOSIS — E785 Hyperlipidemia, unspecified: Secondary | ICD-10-CM

## 2022-07-31 DIAGNOSIS — I10 Essential (primary) hypertension: Secondary | ICD-10-CM

## 2022-07-31 DIAGNOSIS — H5213 Myopia, bilateral: Secondary | ICD-10-CM | POA: Diagnosis not present

## 2022-07-31 NOTE — Patient Instructions (Signed)
Medication Instructions:  Your physician recommends that you continue on your current medications as directed. Please refer to the Current Medication list given to you today.  *If you need a refill on your cardiac medications before your next appointment, please call your pharmacy*  Lab Work: If you have labs (blood work) drawn today and your tests are completely normal, you will receive your results only by: MyChart Message (if you have MyChart) OR A paper copy in the mail If you have any lab test that is abnormal or we need to change your treatment, we will call you to review the results.  Follow-Up: At [facility], you and your health needs are our priority.  As part of our continuing mission to provide you with exceptional heart care, we have created designated Provider Care Teams.  These Care Teams include your primary Cardiologist (physician) and Advanced Practice Providers (APPs -  Physician Assistants and Nurse Practitioners) who all work together to provide you with the care you need, when you need it.  We recommend signing up for the patient portal called "MyChart".  Sign up information is provided on this After Visit Summary.  MyChart is used to connect with patients for Virtual Visits (Telemedicine).  Patients are able to view lab/test results, encounter notes, upcoming appointments, etc.  Non-urgent messages can be sent to your provider as well.   To learn more about what you can do with MyChart, go to [email or web address].    Your next appointment:   6 month(s)  Provider:   Peter Nishan, MD     

## 2022-08-20 ENCOUNTER — Ambulatory Visit (INDEPENDENT_AMBULATORY_CARE_PROVIDER_SITE_OTHER): Payer: Medicare HMO | Admitting: Urology

## 2022-08-20 ENCOUNTER — Encounter: Payer: Self-pay | Admitting: Urology

## 2022-08-20 VITALS — BP 109/73 | HR 89

## 2022-08-20 DIAGNOSIS — N3281 Overactive bladder: Secondary | ICD-10-CM | POA: Diagnosis not present

## 2022-08-20 DIAGNOSIS — Z87440 Personal history of urinary (tract) infections: Secondary | ICD-10-CM | POA: Diagnosis not present

## 2022-08-20 DIAGNOSIS — R351 Nocturia: Secondary | ICD-10-CM | POA: Diagnosis not present

## 2022-08-20 DIAGNOSIS — R350 Frequency of micturition: Secondary | ICD-10-CM | POA: Diagnosis not present

## 2022-08-20 DIAGNOSIS — N3021 Other chronic cystitis with hematuria: Secondary | ICD-10-CM

## 2022-08-20 MED ORDER — GEMTESA 75 MG PO TABS: 1.0000 | ORAL_TABLET | Freq: Every day | ORAL | 0 refills | Status: DC

## 2022-08-20 NOTE — Patient Instructions (Signed)

## 2022-08-20 NOTE — Progress Notes (Unsigned)
08/20/2022 2:40 PM   Erika Ramos 19-Nov-1945 [id number]  Referring provider: Sharilyn Sites, MD [address] [place],  [place] [address]  Followup OAB and recurrent UTI   HPI: Ms Job is a 77yo here for followup for OAB and recurrent UTI. She is currently on mirabegron '50mg'$ . She has an A1c of 10.8. She has decreased urinary frequency during the day but has nocturia every 3x per night and has incontinence on the way to the bathroom. She drinks water when she wakes up at night. She is also on farxiga. He blood sugars have been 150-180. UA today normal   PMH: Past Medical History:  Diagnosis Date   Breast cancer ([place])    Breast cancer, stage 2, right ([place]) 09/06/2009   Qualifier: Diagnosis of  By: Orville Govern, CMA, Carol     Coronary artery disease    Diabetes mellitus without complication ([place])    Fatty liver disease, nonalcoholic    GERD (gastroesophageal reflux disease)    Hypertension    PONV (postoperative nausea and vomiting)    Nausea/ Vomitting- last time 2010   Stented coronary artery     Surgical History: Past Surgical History:  Procedure Laterality Date   ABDOMINAL HYSTERECTOMY     BREAST SURGERY  March 2010   cataract  Right 09/2012   CATARACT EXTRACTION  01/01/2018   left eye   COLONOSCOPY WITH PROPOFOL N/A 07/05/2021   Procedure: COLONOSCOPY WITH PROPOFOL;  Surgeon: Eloise Harman, DO;  [facility];  Service: Endoscopy;  Laterality: N/A;  8:45am   FRACTURE SURGERY     wrist  right   LEFT HEART CATH AND CORONARY ANGIOGRAPHY N/A 09/10/2021   Procedure: LEFT HEART CATH AND CORONARY ANGIOGRAPHY;  Surgeon: Belva Crome, MD;  Location: [place] [facility];  Service: Cardiovascular;  Laterality: N/A;   PARS PLANA VITRECTOMY  07/03/2011   Procedure: PARS PLANA VITRECTOMY WITH 25 GAUGE;  Surgeon: Hayden Pedro, MD;  Location: [place];  Service: Ophthalmology;  Laterality: Right;  MEMBRANE PEEL, HEAD SCOPE LASER, GAS   POLYPECTOMY  07/05/2021    Procedure: POLYPECTOMY;  Surgeon: Eloise Harman, DO;  [facility];  Service: Endoscopy;;   RETINAL DETACHMENT SURGERY     RETINAL DETACHMENT SURGERY  07/03/11   right   TONSILLECTOMY      Home Medications:  Allergies as of 08/20/2022       Reactions   Clopidogrel Bisulfate Hives   Oxycontin [oxycodone Hcl] Nausea And Vomiting        Medication List        Accurate as of August 20, 2022  2:40 PM. If you have any questions, ask your nurse or doctor.          Accu-Chek Guide Me w/Device Kit Use to check blood glucose four times daily   Accu-Chek Guide test strip Generic drug: glucose blood Use to check blood glucose four times daily as instructed   Accu-Chek Softclix Lancets lancets Use to check blood glucose four times daily as instructed   aspirin EC 81 MG tablet Take 81 mg by mouth daily.   Biotin 5000 MCG Chew Chew by mouth daily. Per patient taking 2 chews daily   Calcium-Vitamin D-Vitamin K [id number] MG-UNT-MCG Chew Chew 1 each by mouth daily.   carvedilol 12.5 MG tablet Commonly known as: COREG Take 1 tablet (12.5 mg total) by mouth 2 (two) times daily with a meal.   CENTRUM SILVER ULTRA WOMENS PO Take 2  tablets by mouth daily.   dapagliflozin propanediol 10 MG Tabs tablet Commonly known as: FARXIGA Take 10 mg by mouth daily.   escitalopram 10 MG tablet Commonly known as: LEXAPRO Take 10 mg by mouth daily.   fenofibrate 145 MG tablet Commonly known as: TRICOR Take 1 tablet (145 mg total) by mouth daily.   FISH OIL PO Take 1 capsule by mouth daily at 6 (six) AM. Per patient taking daily not taking at 6:00 am   Janumet 50-1000 MG tablet Generic drug: sitaGLIPtin-metformin Take 1 tablet by mouth 2 (two) times daily with a meal.   Myrbetriq 50 MG Tb24 tablet Generic drug: mirabegron ER Take 50 mg by mouth daily.   nitroGLYCERIN 0.4 MG SL tablet Commonly known as: NITROSTAT Place 1 tablet (0.4 mg total) under the tongue  every 5 (five) minutes as needed for chest pain.   omeprazole 20 MG capsule Commonly known as: PRILOSEC Take 20 mg by mouth daily.   ramipril 10 MG capsule Commonly known as: ALTACE Take 10 mg by mouth daily.   rosuvastatin 40 MG tablet Commonly known as: CRESTOR Take 1 tablet (40 mg total) by mouth daily.   VITAMIN B-12 PO Take 1 tablet by mouth daily.   vitamin C 250 MG tablet Commonly known as: ASCORBIC ACID Take 500 mg by mouth daily.   VITAMIN D3 GUMMIES ADULT PO Take 2,000 Units by mouth daily.   zinc gluconate 50 MG tablet Take 50 mg by mouth daily.        Allergies:  Allergies  Allergen Reactions   Clopidogrel Bisulfate Hives   Oxycontin [Oxycodone Hcl] Nausea And Vomiting    Family History: Family History  Problem Relation Age of Onset   Heart disease Mother    Ovarian cancer Mother    Stroke Father    Diabetes Sister    Diabetes Brother    Diabetes Sister    Heart disease Brother    Cancer Brother        spine   Anesthesia problems Neg Hx     Social History:  reports that she has never smoked. She has never used smokeless tobacco. She reports that she does not drink alcohol and does not use drugs.  ROS: All other review of systems were reviewed and are negative except what is noted above in HPI  Physical Exam: BP 109/73   Pulse 89   Constitutional:  Alert and oriented, No acute distress. HEENT: [place] AT, moist mucus membranes.  Trachea midline, no masses. Cardiovascular: No clubbing, cyanosis, or edema. Respiratory: Normal respiratory effort, no increased work of breathing. GI: Abdomen is soft, nontender, nondistended, no abdominal masses GU: No CVA tenderness.  Lymph: No cervical or inguinal lymphadenopathy. Skin: No rashes, bruises or suspicious lesions. Neurologic: Grossly intact, no focal deficits, moving all 4 extremities. Psychiatric: Normal mood and affect.  Laboratory Data: Lab Results  Component Value Date   WBC 8.7 03/16/2022    HGB 13.3 03/16/2022   HCT 39.0 03/16/2022   MCV 86.4 03/16/2022   PLT 207 03/16/2022    Lab Results  Component Value Date   CREATININE 0.60 03/16/2022    No results found for: "PSA"  No results found for: "TESTOSTERONE"  Lab Results  Component Value Date   HGBA1C 10.8 (A) 06/12/2022    Urinalysis    Component Value Date/Time   APPEARANCEUR Clear 08/19/2021 1445   GLUCOSEU 3+ (A) 08/19/2021 1445   BILIRUBINUR Negative 08/19/2021 1445   PROTEINUR Negative 08/19/2021 1445  NITRITE Negative 08/19/2021 1445   LEUKOCYTESUR Negative 08/19/2021 1445    Lab Results  Component Value Date   LABMICR Comment 08/19/2021    Pertinent Imaging:  No results found for this or any previous visit.  No results found for this or any previous visit.  No results found for this or any previous visit.  No results found for this or any previous visit.  No results found for this or any previous visit.  No valid procedures specified. No results found for this or any previous visit.  No results found for this or any previous visit.   Assessment & Plan:    1. Urinary frequency/OAB -We will trial gemtesa '75mg'$  daily - Urinalysis, Routine w reflex microscopic  2. Chronic cystitis with hematuria -we will continue observation at this time sinc eit has been 3 months since she has had a UTI.   No follow-ups on file.  Nicolette Bang, MD  [facility] [place]

## 2022-08-21 LAB — URINALYSIS, ROUTINE W REFLEX MICROSCOPIC
Bilirubin, UA: NEGATIVE
Ketones, UA: NEGATIVE
Leukocytes,UA: NEGATIVE
Nitrite, UA: NEGATIVE
Protein,UA: NEGATIVE
RBC, UA: NEGATIVE
Specific Gravity, UA: <1.005 — ABNORMAL LOW (ref 1.005–1.030)
Urobilinogen, Ur: 0.2 mg/dL (ref 0.2–1.0)
pH, UA: 5 (ref 5.0–7.5)

## 2022-10-21 ENCOUNTER — Ambulatory Visit: Payer: Medicare HMO | Admitting: "Endocrinology

## 2022-10-28 ENCOUNTER — Encounter: Payer: Self-pay | Admitting: Urology

## 2022-10-28 ENCOUNTER — Ambulatory Visit (INDEPENDENT_AMBULATORY_CARE_PROVIDER_SITE_OTHER): Payer: Medicare HMO | Admitting: Urology

## 2022-10-28 VITALS — BP 129/77 | HR 82

## 2022-10-28 DIAGNOSIS — R350 Frequency of micturition: Secondary | ICD-10-CM

## 2022-10-28 MED ORDER — GEMTESA 75 MG PO TABS: 1.0000 | ORAL_TABLET | Freq: Every day | ORAL | 11 refills | Status: DC

## 2022-10-28 NOTE — Progress Notes (Signed)
10/28/2022 3:22 PM   Erika Ramos 1945/11/01 [id number]  Referring provider: Assunta Found, MD [address] [place],  [place] [address]  Followup OAB   HPI: [name] is a 77yo here for followup for OAB. Nocturia improved to 1-2x on gemtesa. Urgency is decreased and urge incontinence is rare on gemtesa. She is doing better on gemtesa compared to [place]. No straining to urinate. Urine stream strong.   PMH: Past Medical History:  Diagnosis Date   Breast cancer (HCC)    Breast cancer, stage 2, right (HCC) 09/06/2009   Qualifier: Diagnosis of  By: Denyse Amass, CMA, Carol     Coronary artery disease    Diabetes mellitus without complication (HCC)    Fatty liver disease, nonalcoholic    GERD (gastroesophageal reflux disease)    Hypertension    PONV (postoperative nausea and vomiting)    Nausea/ Vomitting- last time 2010   Stented coronary artery     Surgical History: Past Surgical History:  Procedure Laterality Date   ABDOMINAL HYSTERECTOMY     BREAST SURGERY  March 2010   cataract  Right 09/2012   CATARACT EXTRACTION  01/01/2018   left eye   COLONOSCOPY WITH PROPOFOL N/A 07/05/2021   Procedure: COLONOSCOPY WITH PROPOFOL;  Surgeon: Lanelle Bal, DO;  Location: [facility];  Service: Endoscopy;  Laterality: N/A;  8:45am   FRACTURE SURGERY     wrist  right   LEFT HEART CATH AND CORONARY ANGIOGRAPHY N/A 09/10/2021   Procedure: LEFT HEART CATH AND CORONARY ANGIOGRAPHY;  Surgeon: Lyn Records, MD;  Location: [facility];  Service: Cardiovascular;  Laterality: N/A;   PARS PLANA VITRECTOMY  07/03/2011   Procedure: PARS PLANA VITRECTOMY WITH 25 GAUGE;  Surgeon: Sherrie George, MD;  Location: [facility];  Service: Ophthalmology;  Laterality: Right;  MEMBRANE PEEL, HEAD SCOPE LASER, GAS   POLYPECTOMY  07/05/2021   Procedure: POLYPECTOMY;  Surgeon: Lanelle Bal, DO;  Location: [facility];  Service: Endoscopy;;   RETINAL DETACHMENT SURGERY     RETINAL  DETACHMENT SURGERY  07/03/11   right   TONSILLECTOMY      Home Medications:  Allergies as of 10/28/2022       Reactions   Clopidogrel Bisulfate Hives   Oxycontin [oxycodone Hcl] Nausea And Vomiting        Medication List        Accurate as of Oct 28, 2022  3:22 PM. If you have any questions, ask your nurse or doctor.          Accu-Chek Guide Me w/Device Kit Use to check blood glucose four times daily   Accu-Chek Guide test strip Generic drug: glucose blood Use to check blood glucose four times daily as instructed   Accu-Chek Softclix Lancets lancets Use to check blood glucose four times daily as instructed   aspirin EC 81 MG tablet Take 81 mg by mouth daily.   Biotin 5000 MCG Chew Chew by mouth daily. Per patient taking 2 chews daily   Calcium-Vitamin D-Vitamin K 500-500-40 MG-UNT-MCG Chew Chew 1 each by mouth daily.   carvedilol 12.5 MG tablet Commonly known as: COREG Take 1 tablet (12.5 mg total) by mouth 2 (two) times daily with a meal.   CENTRUM SILVER ULTRA WOMENS PO Take 2 tablets by mouth daily.   dapagliflozin propanediol 10 MG Tabs tablet Commonly known as: FARXIGA Take 10 mg by mouth daily.   escitalopram 10 MG tablet Commonly known as: LEXAPRO Take  10 mg by mouth daily.   fenofibrate 145 MG tablet Commonly known as: TRICOR Take 1 tablet (145 mg total) by mouth daily.   FISH OIL PO Take 1 capsule by mouth daily at 6 (six) AM. Per patient taking daily not taking at 6:00 am   Gemtesa 75 MG Tabs Generic drug: Vibegron Take 1 tablet (75 mg total) by mouth daily.   Janumet 50-1000 MG tablet Generic drug: sitaGLIPtin-metformin Take 1 tablet by mouth 2 (two) times daily with a meal.   Myrbetriq 50 MG Tb24 tablet Generic drug: mirabegron ER Take 50 mg by mouth daily.   nitroGLYCERIN 0.4 MG SL tablet Commonly known as: NITROSTAT Place 1 tablet (0.4 mg total) under the tongue every 5 (five) minutes as needed for chest pain.   omeprazole 20  MG capsule Commonly known as: PRILOSEC Take 20 mg by mouth daily.   ramipril 10 MG capsule Commonly known as: ALTACE Take 10 mg by mouth daily.   rosuvastatin 40 MG tablet Commonly known as: CRESTOR Take 1 tablet (40 mg total) by mouth daily.   VITAMIN B-12 PO Take 1 tablet by mouth daily.   vitamin C 250 MG tablet Commonly known as: ASCORBIC ACID Take 500 mg by mouth daily.   VITAMIN D3 GUMMIES ADULT PO Take 2,000 Units by mouth daily.   zinc gluconate 50 MG tablet Take 50 mg by mouth daily.        Allergies:  Allergies  Allergen Reactions   Clopidogrel Bisulfate Hives   Oxycontin [Oxycodone Hcl] Nausea And Vomiting    Family History: Family History  Problem Relation Age of Onset   Heart disease Mother    Ovarian cancer Mother    Stroke Father    Diabetes Sister    Diabetes Brother    Diabetes Sister    Heart disease Brother    Cancer Brother        spine   Anesthesia problems Neg Hx     Social History:  reports that she has never smoked. She has never used smokeless tobacco. She reports that she does not drink alcohol and does not use drugs.  ROS: All other review of systems were reviewed and are negative except what is noted above in HPI  Physical Exam: BP 129/77   Pulse 82   Constitutional:  Alert and oriented, No acute distress. HEENT: [place] AT, moist mucus membranes.  Trachea midline, no masses. Cardiovascular: No clubbing, cyanosis, or edema. Respiratory: Normal respiratory effort, no increased work of breathing. GI: Abdomen is soft, nontender, nondistended, no abdominal masses GU: No CVA tenderness.  Lymph: No cervical or inguinal lymphadenopathy. Skin: No rashes, bruises or suspicious lesions. Neurologic: Grossly intact, no focal deficits, moving all 4 extremities. Psychiatric: Normal mood and affect.  Laboratory Data: Lab Results  Component Value Date   WBC 8.7 03/16/2022   HGB 13.3 03/16/2022   HCT 39.0 03/16/2022   MCV 86.4  03/16/2022   PLT 207 03/16/2022    Lab Results  Component Value Date   CREATININE 0.60 03/16/2022    No results found for: "PSA"  No results found for: "TESTOSTERONE"  Lab Results  Component Value Date   HGBA1C 10.8 (A) 06/12/2022    Urinalysis    Component Value Date/Time   APPEARANCEUR Hazy (A) 08/20/2022 1439   GLUCOSEU 3+ (A) 08/20/2022 1439   BILIRUBINUR Negative 08/20/2022 1439   PROTEINUR Negative 08/20/2022 1439   NITRITE Negative 08/20/2022 1439   LEUKOCYTESUR Negative 08/20/2022 1439  Lab Results  Component Value Date   LABMICR Comment 08/20/2022    Pertinent Imaging:  No results found for this or any previous visit.  No results found for this or any previous visit.  No results found for this or any previous visit.  No results found for this or any previous visit.  No results found for this or any previous visit.  No valid procedures specified. No results found for this or any previous visit.  No results found for this or any previous visit.   Assessment & Plan:    1. Urinary frequency -continue gemtesa 75mg  daily - Urinalysis, Routine w reflex microscopic   No follow-ups on file.  Wilkie Aye, MD  [facility] [place]  z

## 2022-10-28 NOTE — Patient Instructions (Signed)

## 2022-10-29 LAB — URINALYSIS, ROUTINE W REFLEX MICROSCOPIC
Bilirubin, UA: NEGATIVE
Leukocytes,UA: NEGATIVE
Nitrite, UA: NEGATIVE
Protein,UA: NEGATIVE
RBC, UA: NEGATIVE
Specific Gravity, UA: 1.01 (ref 1.005–1.030)
Urobilinogen, Ur: 0.2 mg/dL (ref 0.2–1.0)
pH, UA: 5.5 (ref 5.0–7.5)

## 2022-11-21 DIAGNOSIS — E119 Type 2 diabetes mellitus without complications: Secondary | ICD-10-CM | POA: Diagnosis not present

## 2022-11-22 LAB — LIPID PANEL
Chol/HDL Ratio: 3.3 ratio (ref 0.0–4.4)
Cholesterol, Total: 144 mg/dL (ref 100–199)
HDL: 44 mg/dL
LDL Chol Calc (NIH): 79 mg/dL (ref 0–99)
Triglycerides: 113 mg/dL (ref 0–149)
VLDL Cholesterol Cal: 21 mg/dL (ref 5–40)

## 2022-11-22 LAB — COMPREHENSIVE METABOLIC PANEL WITH GFR
ALT: 23 IU/L (ref 0–32)
AST: 29 IU/L (ref 0–40)
Albumin/Globulin Ratio: 1.7 (ref 1.2–2.2)
Albumin: 4.2 g/dL (ref 3.8–4.8)
Alkaline Phosphatase: 59 IU/L (ref 44–121)
BUN/Creatinine Ratio: 20 (ref 12–28)
BUN: 14 mg/dL (ref 8–27)
Bilirubin Total: 0.3 mg/dL (ref 0.0–1.2)
CO2: 23 mmol/L (ref 20–29)
Calcium: 10.2 mg/dL (ref 8.7–10.3)
Chloride: 99 mmol/L (ref 96–106)
Creatinine, Ser: 0.71 mg/dL (ref 0.57–1.00)
Globulin, Total: 2.5 g/dL (ref 1.5–4.5)
Glucose: 186 mg/dL — ABNORMAL HIGH (ref 70–99)
Potassium: 4.6 mmol/L (ref 3.5–5.2)
Sodium: 138 mmol/L (ref 134–144)
Total Protein: 6.7 g/dL (ref 6.0–8.5)
eGFR: 89 mL/min/1.73

## 2022-11-22 LAB — VITAMIN B12: Vitamin B-12: 418 pg/mL (ref 232–1245)

## 2022-11-22 LAB — VITAMIN D 25 HYDROXY (VIT D DEFICIENCY, FRACTURES): Vit D, 25-Hydroxy: 35.3 ng/mL (ref 30.0–100.0)

## 2022-11-22 LAB — TSH: TSH: 1.89 u[IU]/mL (ref 0.450–4.500)

## 2022-11-22 LAB — T4, FREE: Free T4: 1.38 ng/dL (ref 0.82–1.77)

## 2022-11-24 ENCOUNTER — Ambulatory Visit (INDEPENDENT_AMBULATORY_CARE_PROVIDER_SITE_OTHER): Payer: Medicare HMO | Admitting: "Endocrinology

## 2022-11-24 ENCOUNTER — Encounter: Payer: Self-pay | Admitting: "Endocrinology

## 2022-11-24 VITALS — BP 112/64 | HR 76 | Ht 65.0 in | Wt 167.0 lb

## 2022-11-24 DIAGNOSIS — E782 Mixed hyperlipidemia: Secondary | ICD-10-CM | POA: Diagnosis not present

## 2022-11-24 DIAGNOSIS — I10 Essential (primary) hypertension: Secondary | ICD-10-CM

## 2022-11-24 DIAGNOSIS — Z7984 Long term (current) use of oral hypoglycemic drugs: Secondary | ICD-10-CM

## 2022-11-24 DIAGNOSIS — E119 Type 2 diabetes mellitus without complications: Secondary | ICD-10-CM

## 2022-11-24 LAB — POCT GLYCOSYLATED HEMOGLOBIN (HGB A1C)

## 2022-11-24 LAB — HEMOGLOBIN A1C: Hemoglobin A1C: 9

## 2022-11-24 MED ORDER — JANUMET 50-1000 MG PO TABS: 1.0000 | ORAL_TABLET | Freq: Two times a day (BID) | ORAL | 1 refills | Status: DC

## 2022-11-24 MED ORDER — DAPAGLIFLOZIN PROPANEDIOL 10 MG PO TABS: 10.0000 mg | ORAL_TABLET | Freq: Every day | ORAL | 1 refills | Status: DC

## 2022-11-24 NOTE — Patient Instructions (Signed)

## 2022-11-24 NOTE — Progress Notes (Signed)
Endocrinology Consult Note       11/24/2022, 7:47 PM   Subjective:    Patient ID: Erika Ramos, female    DOB: 08-Jun-1946.  Erika Ramos is being seen in consultation for management of currently uncontrolled symptomatic diabetes requested by  Assunta Found, MD.   Past Medical History:  Diagnosis Date   Breast cancer [facility])    Breast cancer, stage 2, right (HCC) 09/06/2009   Qualifier: Diagnosis of  By: Denyse Amass, CMA, Carol     Coronary artery disease    Diabetes mellitus without complication (HCC)    Fatty liver disease, nonalcoholic    GERD (gastroesophageal reflux disease)    Hypertension    PONV (postoperative nausea and vomiting)    Nausea/ Vomitting- last time 2010   Stented coronary artery     Past Surgical History:  Procedure Laterality Date   ABDOMINAL HYSTERECTOMY     BREAST SURGERY  March 2010   cataract  Right 09/2012   CATARACT EXTRACTION  01/01/2018   left eye   COLONOSCOPY WITH PROPOFOL N/A 07/05/2021   Procedure: COLONOSCOPY WITH PROPOFOL;  Surgeon: Lanelle Bal, DO;  Location: [facility];  Service: Endoscopy;  Laterality: N/A;  8:45am   FRACTURE SURGERY     wrist  right   LEFT HEART CATH AND CORONARY ANGIOGRAPHY N/A 09/10/2021   Procedure: LEFT HEART CATH AND CORONARY ANGIOGRAPHY;  Surgeon: Lyn Records, MD;  Location: [facility];  Service: Cardiovascular;  Laterality: N/A;   PARS PLANA VITRECTOMY  07/03/2011   Procedure: PARS PLANA VITRECTOMY WITH 25 GAUGE;  Surgeon: Sherrie George, MD;  Location: [facility] OR;  Service: Ophthalmology;  Laterality: Right;  MEMBRANE PEEL, HEAD SCOPE LASER, GAS   POLYPECTOMY  07/05/2021   Procedure: POLYPECTOMY;  Surgeon: Lanelle Bal, DO;  Location: [facility];  Service: Endoscopy;;   RETINAL DETACHMENT SURGERY     RETINAL DETACHMENT SURGERY  07/03/11   right   TONSILLECTOMY      Social History   Socioeconomic History    Marital status: Widowed    Spouse name: Not on file   Number of children: Not on file   Years of education: Not on file   Highest education level: Not on file  Occupational History   Not on file  Tobacco Use   Smoking status: Never   Smokeless tobacco: Never  Vaping Use   Vaping Use: Never used  Substance and Sexual Activity   Alcohol use: No   Drug use: No   Sexual activity: Not on file    Comment: single-2 grown children  Other Topics Concern   Not on file  Social History Narrative   Not on file   Social Determinants of Health   Financial Resource Strain: Not on file  Food Insecurity: Not on file  Transportation Needs: Not on file  Physical Activity: Not on file  Stress: Not on file  Social Connections: Not on file    Family History  Problem Relation Age of Onset   Heart disease Mother    Ovarian cancer Mother    Stroke Father  Diabetes Sister    Diabetes Brother    Diabetes Sister    Heart disease Brother    Cancer Brother        spine   Anesthesia problems Neg Hx     Outpatient Encounter Medications as of 11/24/2022  Medication Sig   Accu-Chek Softclix Lancets lancets Use to check blood glucose four times daily as instructed   aspirin EC 81 MG tablet Take 81 mg by mouth daily.   Biotin 5000 MCG CHEW Chew by mouth daily. Per patient taking 2 chews daily   Blood Glucose Monitoring Suppl (ACCU-CHEK GUIDE ME) w/Device KIT Use to check blood glucose four times daily   Calcium-Vitamin D-Vitamin K 500-500-40 MG-UNT-MCG CHEW Chew 1 each by mouth daily.   carvedilol (COREG) 12.5 MG tablet Take 1 tablet (12.5 mg total) by mouth 2 (two) times daily with a meal.   Cholecalciferol (VITAMIN D3 GUMMIES ADULT PO) Take 2,000 Units by mouth daily.   Cyanocobalamin (VITAMIN B-12 PO) Take 1 tablet by mouth daily.   dapagliflozin propanediol (FARXIGA) 10 MG TABS tablet Take 1 tablet (10 mg total) by mouth daily.   escitalopram (LEXAPRO) 10 MG tablet Take 10 mg by mouth  daily.   fenofibrate (TRICOR) 145 MG tablet Take 1 tablet (145 mg total) by mouth daily.   glucose blood (ACCU-CHEK GUIDE) test strip Use to check blood glucose four times daily as instructed   JANUMET 50-1000 MG tablet Take 1 tablet by mouth 2 (two) times daily with a meal.   Multiple Vitamins-Minerals (CENTRUM SILVER ULTRA WOMENS PO) Take 2 tablets by mouth daily.   nitroGLYCERIN (NITROSTAT) 0.4 MG SL tablet Place 1 tablet (0.4 mg total) under the tongue every 5 (five) minutes as needed for chest pain.   Omega-3 Fatty Acids (FISH OIL PO) Take 1 capsule by mouth daily at 6 (six) AM. Per patient taking daily not taking at 6:00 am   omeprazole (PRILOSEC) 20 MG capsule Take 20 mg by mouth daily.   ramipril (ALTACE) 10 MG capsule Take 10 mg by mouth daily.   rosuvastatin (CRESTOR) 40 MG tablet Take 1 tablet (40 mg total) by mouth daily.   Vibegron (GEMTESA) 75 MG TABS Take 1 tablet (75 mg total) by mouth daily.   vitamin C (ASCORBIC ACID) 250 MG tablet Take 500 mg by mouth daily.   zinc gluconate 50 MG tablet Take 50 mg by mouth daily.   [DISCONTINUED] dapagliflozin propanediol (FARXIGA) 10 MG TABS tablet Take 10 mg by mouth daily.   [DISCONTINUED] JANUMET 50-1000 MG tablet Take 1 tablet by mouth 2 (two) times daily with a meal.    [DISCONTINUED] MYRBETRIQ 50 MG TB24 tablet Take 50 mg by mouth daily.   No facility-administered encounter medications on file as of 11/24/2022.    ALLERGIES: Allergies  Allergen Reactions   Clopidogrel Bisulfate Hives   Oxycontin [Oxycodone Hcl] Nausea And Vomiting    VACCINATION STATUS: Immunization History  Administered Date(s) Administered   Influenza,inj,Quad PF,6+ Mos 04/03/2016   PFIZER(Purple Top)SARS-COV-2 Vaccination 07/31/2019, 08/24/2019    Diabetes She presents for her follow-up diabetic visit. She has type 2 diabetes mellitus. Onset time: She was diagnosed at approximate age of 28 years. Her disease course has been improving. There are no  hypoglycemic associated symptoms. Pertinent negatives for hypoglycemia include no confusion, headaches, pallor or seizures. Associated symptoms include fatigue and polyuria. Pertinent negatives for diabetes include no chest pain, no polydipsia and no polyphagia. There are no hypoglycemic complications. Symptoms are improving. There  are no diabetic complications. Risk factors for coronary artery disease include diabetes mellitus, dyslipidemia, hypertension, family history, post-menopausal and sedentary lifestyle. Current diabetic treatments: She is currently on Farxiga 10 mg p.o. daily at breakfast, Janumet 50/1000 mg twice daily. Her weight is fluctuating minimally. She is following a generally unhealthy diet. When asked about meal planning, she reported none. She has not had a previous visit with a dietitian. She rarely participates in exercise. Her home blood glucose trend is decreasing steadily. Her breakfast blood glucose range is generally 180-200 mg/dl. Her dinner blood glucose range is generally 180-200 mg/dl. Her bedtime blood glucose range is generally 180-200 mg/dl. Her overall blood glucose range is 180-200 mg/dl. (She did bring her meter and logs averaging blood glucose of 197 over the last 7 days, point-of-care A1c of 9% improving from 10.8%.  She denies hypoglycemia.    ) An ACE inhibitor/angiotensin II receptor blocker is being taken.  Hypertension This is a chronic problem. The current episode started more than 1 year ago. The problem is controlled. Pertinent negatives include no chest pain, headaches, palpitations or shortness of breath. Risk factors for coronary artery disease include diabetes mellitus, sedentary lifestyle, post-menopausal state, dyslipidemia and family history. Past treatments include ACE inhibitors. The current treatment provides moderate improvement.  Hyperlipidemia This is a chronic problem. The current episode started more than 1 year ago. Exacerbating diseases include  diabetes. Pertinent negatives include no chest pain, myalgias or shortness of breath. Current antihyperlipidemic treatment includes statins. Risk factors for coronary artery disease include diabetes mellitus, dyslipidemia, hypertension, a sedentary lifestyle and post-menopausal.     Review of Systems  Constitutional:  Positive for fatigue. Negative for chills, fever and unexpected weight change.  HENT:  Negative for trouble swallowing and voice change.   Eyes:  Negative for visual disturbance.  Respiratory:  Negative for cough, shortness of breath and wheezing.   Cardiovascular:  Negative for chest pain, palpitations and leg swelling.  Gastrointestinal:  Negative for diarrhea, nausea and vomiting.  Endocrine: Positive for polyuria. Negative for cold intolerance, heat intolerance, polydipsia and polyphagia.  Musculoskeletal:  Negative for arthralgias and myalgias.  Skin:  Negative for color change, pallor, rash and wound.  Neurological:  Negative for seizures and headaches.  Psychiatric/Behavioral:  Negative for confusion and suicidal ideas.     Objective:       11/24/2022    1:09 PM 10/28/2022    3:19 PM 08/20/2022    2:32 PM  Vitals with BMI  Height 5\' 5"     Weight 167 lbs    BMI 27.79    Systolic 112 129 161  Diastolic 64 77 73  Pulse 76 82 89    BP 112/64   Pulse 76   Ht 5\' 5"  (1.651 m)   Wt 167 lb (75.8 kg)   BMI 27.79 kg/m   Wt Readings from Last 3 Encounters:  11/24/22 167 lb (75.8 kg)  07/31/22 166 lb (75.3 kg)  07/18/22 169 lb (76.7 kg)      CMP ( most recent) CMP     Component Value Date/Time   NA 138 11/21/2022 1124   K 4.6 11/21/2022 1124   CL 99 11/21/2022 1124   CO2 23 11/21/2022 1124   GLUCOSE 186 (H) 11/21/2022 1124   GLUCOSE 286 (H) 03/16/2022 2002   BUN 14 11/21/2022 1124   CREATININE 0.71 11/21/2022 1124   CALCIUM 10.2 11/21/2022 1124   PROT 6.7 11/21/2022 1124   ALBUMIN 4.2 11/21/2022 1124   AST  29 11/21/2022 1124   ALT 23 11/21/2022 1124    ALKPHOS 59 11/21/2022 1124   BILITOT 0.3 11/21/2022 1124   GFRNONAA >60 03/16/2022 1948   GFRAA 52 (L) 07/17/2017 2118     Diabetic Labs (most recent): Lab Results  Component Value Date   HGBA1C 9 11/24/2022   HGBA1C 10.8 (A) 06/12/2022   HGBA1C 11.3 (H) 02/13/2022     Lipid Panel ( most recent) Lipid Panel     Component Value Date/Time   CHOL 144 11/21/2022 1124   TRIG 113 11/21/2022 1124   HDL 44 11/21/2022 1124   CHOLHDL 3.3 11/21/2022 1124   CHOLHDL 3.4 12/30/2021 1445   VLDL 49 (H) 12/30/2021 1445   LDLCALC 79 11/21/2022 1124   LABVLDL 21 11/21/2022 1124      Assessment & Plan:   1. Type 2 diabetes mellitus without complication, without long-term current use of insulin (HCC)   - Erika Ramos has currently uncontrolled symptomatic type 2 DM since  77 years of age. She did bring her meter and logs averaging blood glucose of 197 over the last 7 days, point-of-care A1c of 9% improving from 10.8%.  She denies hypoglycemia.    - I had a long discussion with her about the possible risk factors and  the pathology behind its diabetes and its complications. -her diabetes is complicated by sedentary life, dietary indiscretions and she remains at a high risk for more acute and chronic complications which include CAD, CVA, CKD, retinopathy, and neuropathy. These are all discussed in detail with her.  - I discussed all available options of managing her diabetes including de-escalation of medications. I have counseled her on Food as Medicine by adopting a Whole Food , Plant Predominant  ( WFPP) nutrition as recommended by [employer] of Lifestyle Medicine. Patient is encouraged to switch to  unprocessed or minimally processed  complex starch, adequate protein intake (mainly plant source), minimal liquid fat, plenty of fruits, and vegetables. -  she is advised to stick to a routine mealtimes to eat 3 complete meals a day and snack only when necessary ( to snack only to  correct hypoglycemia BG <70 day time or <100 at night).  Patient made significant change in her diet and benefiting from lifestyle medicine. In light of her desire to avoid injectable treatment for diabetes she remains a good candidate for lifestyle medicine. - she acknowledges that there is a room for improvement in her food and drink choices. - Suggestion is made for her to avoid simple carbohydrates  from her diet including Cakes, Sweet Desserts, Ice Cream, Soda (diet and regular), Sweet Tea, Candies, Chips, Cookies, Store Bought Juices, Alcohol , Artificial Sweeteners,  Coffee Creamer, and "Sugar-free" Products, Lemonade. This will help patient to have more stable blood glucose profile and potentially avoid unintended weight gain.  The following Lifestyle Medicine recommendations according to American College of Lifestyle Medicine  [facility]) were discussed and and offered to patient and she  agrees to start the journey:  A. Whole Foods, Plant-Based Nutrition comprising of fruits and vegetables, plant-based proteins, whole-grain carbohydrates was discussed in detail with the patient.   A list for source of those nutrients were also provided to the patient.  Patient will use only water or unsweetened tea for hydration. B.  The need to stay away from risky substances including alcohol, smoking; obtaining 7 to 9 hours of restorative sleep, at least 150 minutes of moderate intensity exercise weekly, the importance of healthy social  connections,  and stress management techniques were discussed. C.  A full color page of  Calorie density of various food groups per pound showing examples of each food groups was provided to the patient.   - she will be scheduled with Norm Salt, RDN, CDE for individualized diabetes education.  - I have approached her with the following individualized plan to manage  her diabetes and patient agrees:   -In light of her presentation with above target glycemic profile, she  may need insulin treatment in order for her to achieve control of diabetes to target especially with the fact that she is disengaging from lifestyle medicine. -However, this patient would like to avoid injectable treatment for now.  She will be given a chance until next visit.   -Accordingly, she is advised to continue Farxiga 10 mg p.o. daily at breakfast.  Continue Janumet 50/1000 mg p.o. twice daily.  She is willing to monitor blood glucose twice a day-daily before breakfast and at bedtime.    - she is encouraged to call clinic for blood glucose levels less than 70 or above 300 mg /dl. - Specific targets for  A1c;  LDL, HDL,  and Triglycerides were discussed with the patient.  2) Blood Pressure /Hypertension:   -Her blood pressure is controlled to target.  she is advised to continue her current medications including ramipril 10 mg p.o. daily with breakfast . 3) Lipids/Hyperlipidemia:   Her lipid panel is controlled will LDL of 79.  she  is advised to continue   Crestor 40 mg p.o. daily at bedtime.  Side effects and precautions discussed with her.  4)  Weight/Diet:  Body mass index is 27.79 kg/m.  -   she is  a candidate for some weight loss. I discussed with her the fact that loss of 5 - 10% of her  current body weight will have the most impact on her diabetes management.  The above detailed  ACLM recommendations for nutrition, exercise, sleep, social life, avoidance of risky substances, the need for restorative sleep   information will also detailed on discharge instructions.  5) Chronic Care/Health Maintenance:  -she  is on ACEI/ARB and Statin medications and  is encouraged to initiate and continue to follow up with Ophthalmology, Dentist,  Podiatrist at least yearly or according to recommendations, and advised to   stay away from smoking. I have recommended yearly flu vaccine and pneumonia vaccine at least every 5 years; moderate intensity exercise for up to 150 minutes weekly; and  sleep for  7- 9 hours a day.  - she is  advised to maintain close follow up with Assunta Found, MD for primary care needs, as well as her other providers for optimal and coordinated care.   I spent  26  minutes in the care of the patient today including review of labs from CMP, Lipids, Thyroid Function, Hematology (current and previous including abstractions from other facilities); face-to-face time discussing  her blood glucose readings/logs, discussing hypoglycemia and hyperglycemia episodes and symptoms, medications doses, her options of short and long term treatment based on the latest standards of care / guidelines;  discussion about incorporating lifestyle medicine;  and documenting the encounter. Risk reduction counseling performed per USPSTF guidelines to reduce  obesity and cardiovascular risk factors.     Please refer to Patient Instructions for Blood Glucose Monitoring and Insulin/Medications Dosing Guide"  in media tab for additional information. Please  also refer to " Patient Self Inventory" in the Media  tab  for reviewed elements of pertinent patient history.  Erika Ramos participated in the discussions, expressed understanding, and voiced agreement with the above plans.  All questions were answered to her satisfaction. she is encouraged to contact clinic should she have any questions or concerns prior to her return visit.    Follow up plan: - Return in about 3 months (around 02/24/2023) for Bring Meter/CGM Device/Logs- A1c in Office.  Marquis Lunch, MD [facility] [address] [place], [place] [address] Phone: [phone]  Fax: [phone]    11/24/2022, 7:47 PM  This note was partially dictated with voice recognition software. Similar sounding words can be transcribed inadequately or may not  be corrected upon review.

## 2022-11-25 ENCOUNTER — Other Ambulatory Visit: Payer: Self-pay

## 2022-11-25 MED ORDER — DAPAGLIFLOZIN PROPANEDIOL 10 MG PO TABS: 10.0000 mg | ORAL_TABLET | Freq: Every day | ORAL | 0 refills | Status: DC

## 2022-12-17 DIAGNOSIS — E6609 Other obesity due to excess calories: Secondary | ICD-10-CM | POA: Diagnosis not present

## 2022-12-17 DIAGNOSIS — Z6829 Body mass index (BMI) 29.0-29.9, adult: Secondary | ICD-10-CM | POA: Diagnosis not present

## 2022-12-17 DIAGNOSIS — N390 Urinary tract infection, site not specified: Secondary | ICD-10-CM | POA: Diagnosis not present

## 2022-12-31 ENCOUNTER — Other Ambulatory Visit: Payer: Self-pay | Admitting: Nurse Practitioner

## 2023-01-02 ENCOUNTER — Other Ambulatory Visit: Payer: Self-pay | Admitting: Nurse Practitioner

## 2023-01-07 ENCOUNTER — Other Ambulatory Visit: Payer: Self-pay

## 2023-01-07 MED ORDER — CARVEDILOL 12.5 MG PO TABS: 12.5000 mg | ORAL_TABLET | Freq: Two times a day (BID) | ORAL | 2 refills | Status: DC

## 2023-01-16 DIAGNOSIS — M9903 Segmental and somatic dysfunction of lumbar region: Secondary | ICD-10-CM | POA: Diagnosis not present

## 2023-01-16 DIAGNOSIS — M5442 Lumbago with sciatica, left side: Secondary | ICD-10-CM | POA: Diagnosis not present

## 2023-01-16 DIAGNOSIS — M9905 Segmental and somatic dysfunction of pelvic region: Secondary | ICD-10-CM | POA: Diagnosis not present

## 2023-01-16 DIAGNOSIS — M9902 Segmental and somatic dysfunction of thoracic region: Secondary | ICD-10-CM | POA: Diagnosis not present

## 2023-01-23 NOTE — Progress Notes (Deleted)
Cardiology Office Note:    Date:  01/23/2023   ID:  Erika Ramos, Erika Ramos 07-22-1945, MRN [id number]  PCP:  Assunta Found, MD   [facility] Providers Cardiologist:  Charlton Haws, MD     Referring MD: Assunta Found, MD   Chief Complaint: chest pain  History of Present Illness:    Erika Ramos is a 77 y.o. female with a hx of CAD s/p PCI RCA/PTCA PDA in 2001, hyperlipidemia, breast cancer s/p lumpectomy followed by chemotherapy and radiation 2009, HTN, and diabetes.   08/26/21 at which time she reported substernal chest pain  Myoview completed on 09/05/21 was reported as abnormal  She underwent cardiac catheterization on 09/10/21 which revealed severe diffuse heavy coronary calcification, 30% distal left main, mid 50%, mid 65%, and distal 85% LAD. A branch of first diagonal contains 90% stenosis, 30% proximal circumflex, segment of proximal to mid 60% RCA, mid to distal PDA 90% stenosis, widely patent distal RCA stent with diffuse 30% narrowing, normal LV function and LVEDP. Current most severe lesions felt to be potentially approachable but would carry a higher than usual complication risk and in absence of convincing symptoms, no clinical utility in stenting.   Doing well on medical Rx with no classic angina Compliant with meds  Has atypical SSCP only at night when going to bed not with exertion   DM:  poorly controlled Seeing Dr Fransico Him A1c 9 11/24/22   She broke arm/nose with a fall in October when she was at a convention/study in [place]  No angina has good nitro not used   Past Medical History:  Diagnosis Date   Breast cancer (HCC)    Breast cancer, stage 2, right (HCC) 09/06/2009   Qualifier: Diagnosis of  By: Denyse Amass, CMA, Carol     Coronary artery disease    Diabetes mellitus without complication (HCC)    Fatty liver disease, nonalcoholic    GERD (gastroesophageal reflux disease)    Hypertension    PONV (postoperative nausea and vomiting)    Nausea/ Vomitting- last  time 2010   Stented coronary artery     Past Surgical History:  Procedure Laterality Date   ABDOMINAL HYSTERECTOMY     BREAST SURGERY  March 2010   cataract  Right 09/2012   CATARACT EXTRACTION  01/01/2018   left eye   COLONOSCOPY WITH PROPOFOL N/A 07/05/2021   Procedure: COLONOSCOPY WITH PROPOFOL;  Surgeon: Lanelle Bal, DO;  [facility];  Service: Endoscopy;  Laterality: N/A;  8:45am   FRACTURE SURGERY     wrist  right   LEFT HEART CATH AND CORONARY ANGIOGRAPHY N/A 09/10/2021   Procedure: LEFT HEART CATH AND CORONARY ANGIOGRAPHY;  Surgeon: Lyn Records, MD;  [facility];  Service: Cardiovascular;  Laterality: N/A;   PARS PLANA VITRECTOMY  07/03/2011   Procedure: PARS PLANA VITRECTOMY WITH 25 GAUGE;  Surgeon: Sherrie George, MD;  Location: [facility] OR;  Service: Ophthalmology;  Laterality: Right;  MEMBRANE PEEL, HEAD SCOPE LASER, GAS   POLYPECTOMY  07/05/2021   Procedure: POLYPECTOMY;  Surgeon: Lanelle Bal, DO;  [facility];  Service: Endoscopy;;   RETINAL DETACHMENT SURGERY     RETINAL DETACHMENT SURGERY  07/03/11   right   TONSILLECTOMY      Current Medications: No outpatient medications have been marked as taking for the 01/29/23 encounter (Appointment) with Wendall Stade, MD.     Allergies:   Clopidogrel bisulfate and Oxycontin [oxycodone hcl]  Social History   Socioeconomic History   Marital status: Widowed    Spouse name: Not on file   Number of children: Not on file   Years of education: Not on file   Highest education level: Not on file  Occupational History   Not on file  Tobacco Use   Smoking status: Never   Smokeless tobacco: Never  Vaping Use   Vaping status: Never Used  Substance and Sexual Activity   Alcohol use: No   Drug use: No   Sexual activity: Not on file    Comment: single-2 grown children  Other Topics Concern   Not on file  Social History Narrative   Not on file   Social Determinants of  Health   Financial Resource Strain: Not on file  Food Insecurity: Not on file  Transportation Needs: Not on file  Physical Activity: Not on file  Stress: Not on file  Social Connections: Not on file     Family History: The patient's family history includes Cancer in her brother; Diabetes in her brother, sister, and sister; Heart disease in her brother and mother; Ovarian cancer in her mother; Stroke in her father. There is no history of Anesthesia problems.  ROS:   Please see the history of present illness.    + occasional episodes of chest discomfort + fatigue All other systems reviewed and are negative.  Labs/Other Studies Reviewed:    The following studies were reviewed today:  LHC 09/10/21  Severe diffuse heavy coronary calcification 30% distal left main Mid 50%, mid 65%, and distal 85% LAD.  A branch of the first diagonal contains 90% stenosis. 30% proximal circumflex Segment of proximal to mid 60% RCA.  Widely patent distal LAD stent with diffuse 30% narrowing.  Mid to distal PDA 90% stenosis. Normal LV function and LVEDP.  EF estimated to be 60%.   RECOMMENDATIONS: Aggressive preventive therapy with LDL lowering less than 55. Anti-ischemic therapy as required to control symptoms.  The current symptoms are not ischemia related. The current most severe lesions are potentially approachable but would carry a higher than usual complication risk and in the absence of convincing symptoms, stenting does not have a clinical utility.  Diagnostic Dominance: Right Intervention   Exercise Myovue 09/05/21    Findings are consistent with prior inferior/inferoapical myocardial infarction with mild to moderate peri-infarct ischemia. Low to intermediate risk   No ST deviation was noted.   LV perfusion is abnormal. Large moderate intensity inferior/inferoapical defect with mild to moderate reversibility.   Left ventricular function is normal. End diastolic cavity size is normal.     Exercise Myoview 03/2016  The patient exercised following the Bruce protocol.  The patient reported shortness of breath during the stress test. The patient experienced no angina during the stress test.   The patient requested the test to be stopped. The test was stopped because the patient complained of shortness of breath.   Heart rate demonstrated a normal response to exercise. Blood pressure demonstrated a hypertensive response to exercise. Overall, the patient's exercise capacity was normal.   85% of maximum heart rate was achieved after 3.3 minutes. Recovery time: 7 minutes. The patient's response to exercise was adequate for diagnosis    Recent Labs: 03/16/2022: Hemoglobin 13.3; Platelets 207 11/21/2022: ALT 23; BUN 14; Creatinine, Ser 0.71; Potassium 4.6; Sodium 138; TSH 1.890  Recent Lipid Panel    Component Value Date/Time   CHOL 144 11/21/2022 1124   TRIG 113 11/21/2022 1124  HDL 44 11/21/2022 1124   CHOLHDL 3.3 11/21/2022 1124   CHOLHDL 3.4 12/30/2021 1445   VLDL 49 (H) 12/30/2021 1445   LDLCALC 79 11/21/2022 1124     Risk Assessment/Calculations:       Physical Exam:    VS:  There were no vitals taken for this visit.    Wt Readings from Last 3 Encounters:  11/24/22 167 lb (75.8 kg)  07/31/22 166 lb (75.3 kg)  07/18/22 169 lb (76.7 kg)    Affect appropriate Healthy:  appears stated age HEENT: normal Neck supple with no adenopathy JVP normal no bruits no thyromegaly Lungs clear with no wheezing and good diaphragmatic motion Heart:  S1/S2 no murmur, no rub, gallop or click PMI normal Abdomen: benighn, BS positve, no tenderness, no AAA no bruit.  No HSM or HJR Distal pulses intact with no bruits No edema Neuro non-focal Skin warm and dry No muscular weakness   EKG:   SR rate 91 LAD poor R wave progression   Diagnoses:    No diagnosis found.   Assessment and Plan:     CAD native without angina: Underwent LHC 09/10/21 recommended medical Rx  Review of angiogram shows tightest lesions in small D1 branch and distal LAD. Moderate proximal RCA dx with patent distal RCA stent Continue coreg, ASA and statin Coreg dose increased post cath  .  Essential hypertension: Well controlled.  Continue current medications and low sodium Dash type diet.    Hyperlipidemia LDL goal < 70: LDL 106 10/28/21. Zocor changed to crestor LDL now at goal 63 02/13/22   4.  Diabetes:  poorly controlled Seeing Dr Fransico Him meds adjusted A1c 10.8  Disposition: F/U 6 months    Medication Adjustments/Labs and Tests Ordered: Current medicines are reviewed at length with the patient today.  Concerns regarding medicines are outlined above.  No orders of the defined types were placed in this encounter.  No orders of the defined types were placed in this encounter.   There are no Patient Instructions on file for this visit.   Signed, Charlton Haws, MD  01/23/2023 5:57 PM    [facility]

## 2023-01-26 DIAGNOSIS — M9902 Segmental and somatic dysfunction of thoracic region: Secondary | ICD-10-CM | POA: Diagnosis not present

## 2023-01-26 DIAGNOSIS — M9903 Segmental and somatic dysfunction of lumbar region: Secondary | ICD-10-CM | POA: Diagnosis not present

## 2023-01-26 DIAGNOSIS — M9905 Segmental and somatic dysfunction of pelvic region: Secondary | ICD-10-CM | POA: Diagnosis not present

## 2023-01-26 DIAGNOSIS — M5442 Lumbago with sciatica, left side: Secondary | ICD-10-CM | POA: Diagnosis not present

## 2023-01-29 ENCOUNTER — Ambulatory Visit: Payer: Medicare HMO | Admitting: Cardiovascular Disease

## 2023-01-30 DIAGNOSIS — M5442 Lumbago with sciatica, left side: Secondary | ICD-10-CM | POA: Diagnosis not present

## 2023-01-30 DIAGNOSIS — M9905 Segmental and somatic dysfunction of pelvic region: Secondary | ICD-10-CM | POA: Diagnosis not present

## 2023-01-30 DIAGNOSIS — M9903 Segmental and somatic dysfunction of lumbar region: Secondary | ICD-10-CM | POA: Diagnosis not present

## 2023-01-30 DIAGNOSIS — M9902 Segmental and somatic dysfunction of thoracic region: Secondary | ICD-10-CM | POA: Diagnosis not present

## 2023-02-02 DIAGNOSIS — M5442 Lumbago with sciatica, left side: Secondary | ICD-10-CM | POA: Diagnosis not present

## 2023-02-02 DIAGNOSIS — Z1331 Encounter for screening for depression: Secondary | ICD-10-CM | POA: Diagnosis not present

## 2023-02-02 DIAGNOSIS — E663 Overweight: Secondary | ICD-10-CM | POA: Diagnosis not present

## 2023-02-02 DIAGNOSIS — M9902 Segmental and somatic dysfunction of thoracic region: Secondary | ICD-10-CM | POA: Diagnosis not present

## 2023-02-02 DIAGNOSIS — M9905 Segmental and somatic dysfunction of pelvic region: Secondary | ICD-10-CM | POA: Diagnosis not present

## 2023-02-02 DIAGNOSIS — Z6829 Body mass index (BMI) 29.0-29.9, adult: Secondary | ICD-10-CM | POA: Diagnosis not present

## 2023-02-02 DIAGNOSIS — M9903 Segmental and somatic dysfunction of lumbar region: Secondary | ICD-10-CM | POA: Diagnosis not present

## 2023-02-02 DIAGNOSIS — Z0001 Encounter for general adult medical examination with abnormal findings: Secondary | ICD-10-CM | POA: Diagnosis not present

## 2023-02-03 NOTE — Progress Notes (Signed)
Cardiology Office Note:    Date:  02/03/2023   ID:  Erika Ramos, DOB 1946/05/15, MRN [id number]  PCP:  Erika Found, MD   [facility] Providers Cardiologist:  Erika Haws, MD     Referring MD: Erika Found, MD   Chief Complaint: chest pain  History of Present Illness:    Erika Ramos is a 77 y.o. female with a hx of CAD s/p PCI RCA/PTCA PDA in 2001, hyperlipidemia, breast cancer s/p lumpectomy followed by chemotherapy and radiation 2009, HTN, and diabetes.   08/26/21 at which time she reported substernal chest pain  Myoview completed on 09/05/21 was reported as abnormal  She underwent cardiac catheterization on 09/10/21 which revealed severe diffuse heavy coronary calcification, 30% distal left main, mid 50%, mid 65%, and distal 85% LAD. A branch of first diagonal contains 90% stenosis, 30% proximal circumflex, segment of proximal to mid 60% RCA, mid to distal PDA 90% stenosis, widely patent distal RCA stent with diffuse 30% narrowing, normal LV function and LVEDP. Current most severe lesions felt to be potentially approachable but would carry a higher than usual complication risk and in absence of convincing symptoms, no clinical utility in stenting.   Doing well on medical Rx with no classic angina Compliant with meds  Has atypical SSCP only at night when going to bed not with exertion   DM:  poorly controlled Seeing Dr Erika Ramos A1c 9 11/24/22   She broke arm/nose with a fall in October when she was at a convention/study in [place]  Has atypical pain in chest More at night without exertion. Some autonomic dysfunction with sweating and diaphoresis  Traveling a lot Went to [place] with sisters     Past Medical History:  Diagnosis Date   Breast cancer [facility])    Breast cancer, stage 2, right (HCC) 09/06/2009   Qualifier: Diagnosis of  By: Denyse Amass, CMA, Carol     Coronary artery disease    Diabetes mellitus without complication (HCC)    Fatty liver disease, nonalcoholic     GERD (gastroesophageal reflux disease)    Hypertension    PONV (postoperative nausea and vomiting)    Nausea/ Vomitting- last time 2010   Stented coronary artery     Past Surgical History:  Procedure Laterality Date   ABDOMINAL HYSTERECTOMY     BREAST SURGERY  March 2010   cataract  Right 09/2012   CATARACT EXTRACTION  01/01/2018   left eye   COLONOSCOPY WITH PROPOFOL N/A 07/05/2021   Procedure: COLONOSCOPY WITH PROPOFOL;  Surgeon: Lanelle Bal, DO;  [facility];  Service: Endoscopy;  Laterality: N/A;  8:45am   FRACTURE SURGERY     wrist  right   LEFT HEART CATH AND CORONARY ANGIOGRAPHY N/A 09/10/2021   Procedure: LEFT HEART CATH AND CORONARY ANGIOGRAPHY;  Surgeon: Lyn Records, MD;  [facility];  Service: Cardiovascular;  Laterality: N/A;   PARS PLANA VITRECTOMY  07/03/2011   Procedure: PARS PLANA VITRECTOMY WITH 25 GAUGE;  Surgeon: Sherrie George, MD;  Location: [facility] OR;  Service: Ophthalmology;  Laterality: Right;  MEMBRANE PEEL, HEAD SCOPE LASER, GAS   POLYPECTOMY  07/05/2021   Procedure: POLYPECTOMY;  Surgeon: Lanelle Bal, DO;  [facility];  Service: Endoscopy;;   RETINAL DETACHMENT SURGERY     RETINAL DETACHMENT SURGERY  07/03/11   right   TONSILLECTOMY      Current Medications: No outpatient medications have been marked as taking for the 02/12/23  encounter (Appointment) with Wendall Stade, MD.     Allergies:   Clopidogrel bisulfate and Oxycontin [oxycodone hcl]   Social History   Socioeconomic History   Marital status: Widowed    Spouse name: Not on file   Number of children: Not on file   Years of education: Not on file   Highest education level: Not on file  Occupational History   Not on file  Tobacco Use   Smoking status: Never   Smokeless tobacco: Never  Vaping Use   Vaping status: Never Used  Substance and Sexual Activity   Alcohol use: No   Drug use: No   Sexual activity: Not on file    Comment:  single-2 grown children  Other Topics Concern   Not on file  Social History Narrative   Not on file   Social Determinants of Health   Financial Resource Strain: Not on file  Food Insecurity: Not on file  Transportation Needs: Not on file  Physical Activity: Not on file  Stress: Not on file  Social Connections: Not on file     Family History: The patient's family history includes Cancer in her brother; Diabetes in her brother, sister, and sister; Heart disease in her brother and mother; Ovarian cancer in her mother; Stroke in her father. There is no history of Anesthesia problems.  ROS:   Please see the history of present illness.    + occasional episodes of chest discomfort + fatigue All other systems reviewed and are negative.  Labs/Other Studies Reviewed:    The following studies were reviewed today:  LHC 09/10/21  Severe diffuse heavy coronary calcification 30% distal left main Mid 50%, mid 65%, and distal 85% LAD.  A branch of the first diagonal contains 90% stenosis. 30% proximal circumflex Segment of proximal to mid 60% RCA.  Widely patent distal LAD stent with diffuse 30% narrowing.  Mid to distal PDA 90% stenosis. Normal LV function and LVEDP.  EF estimated to be 60%.   RECOMMENDATIONS: Aggressive preventive therapy with LDL lowering less than 55. Anti-ischemic therapy as required to control symptoms.  The current symptoms are not ischemia related. The current most severe lesions are potentially approachable but would carry a higher than usual complication risk and in the absence of convincing symptoms, stenting does not have a clinical utility.  Diagnostic Dominance: Right Intervention   Exercise Myovue 09/05/21    Findings are consistent with prior inferior/inferoapical myocardial infarction with mild to moderate peri-infarct ischemia. Low to intermediate risk   No ST deviation was noted.   LV perfusion is abnormal. Large moderate intensity  inferior/inferoapical defect with mild to moderate reversibility.   Left ventricular function is normal. End diastolic cavity size is normal.    Exercise Myoview 03/2016  The patient exercised following the Bruce protocol.  The patient reported shortness of breath during the stress test. The patient experienced no angina during the stress test.   The patient requested the test to be stopped. The test was stopped because the patient complained of shortness of breath.   Heart rate demonstrated a normal response to exercise. Blood pressure demonstrated a hypertensive response to exercise. Overall, the patient's exercise capacity was normal.   85% of maximum heart rate was achieved after 3.3 minutes. Recovery time: 7 minutes. The patient's response to exercise was adequate for diagnosis    Recent Labs: 03/16/2022: Hemoglobin 13.3; Platelets 207 11/21/2022: ALT 23; BUN 14; Creatinine, Ser 0.71; Potassium 4.6; Sodium 138; TSH 1.890  Recent  Lipid Panel    Component Value Date/Time   CHOL 144 11/21/2022 1124   TRIG 113 11/21/2022 1124   HDL 44 11/21/2022 1124   CHOLHDL 3.3 11/21/2022 1124   CHOLHDL 3.4 12/30/2021 1445   VLDL 49 (H) 12/30/2021 1445   LDLCALC 79 11/21/2022 1124     Risk Assessment/Calculations:       Physical Exam:    VS:  There were no vitals taken for this visit.    Wt Readings from Last 3 Encounters:  11/24/22 167 lb (75.8 kg)  07/31/22 166 lb (75.3 kg)  07/18/22 169 lb (76.7 kg)    Affect appropriate Healthy:  appears stated age HEENT: normal Neck supple with no adenopathy JVP normal no bruits no thyromegaly Lungs clear with no wheezing and good diaphragmatic motion Heart:  S1/S2 no murmur, no rub, gallop or click PMI normal Abdomen: benighn, BS positve, no tenderness, no AAA no bruit.  No HSM or HJR Distal pulses intact with no bruits No edema Neuro non-focal Skin warm and dry No muscular weakness   EKG:   SR rate 91 LAD poor R wave progression    Diagnoses:    No diagnosis Ramos.   Assessment and Plan:     CAD native without angina: Underwent LHC 09/10/21 recommended medical Rx Review of angiogram shows tightest lesions in small D1 branch and distal LAD. Moderate proximal RCA dx with patent distal RCA stent Continue coreg, ASA and statin Coreg dose increased post cath  .  Essential hypertension: Well controlled.  Continue current medications and low sodium Dash type diet.    Hyperlipidemia LDL goal < 70: LDL 106 10/28/21. Zocor changed to crestor LDL now at goal 63 02/13/22   4.  Diabetes:  poorly controlled Seeing Dr Erika Ramos meds adjusted A1c 9 improved from 11.3 on 02/13/22  F/U Nida currently on Janumet and Farxiga  Disposition: F/U 6 months    Medication Adjustments/Labs and Tests Ordered: Current medicines are reviewed at length with the patient today.  Concerns regarding medicines are outlined above.  No orders of the defined types were placed in this encounter.  No orders of the defined types were placed in this encounter.   There are no Patient Instructions on file for this visit.   Signed, Erika Haws, MD  02/03/2023 1:18 PM    [facility]

## 2023-02-04 ENCOUNTER — Ambulatory Visit: Payer: Medicare HMO | Admitting: Cardiovascular Disease

## 2023-02-04 DIAGNOSIS — M9902 Segmental and somatic dysfunction of thoracic region: Secondary | ICD-10-CM | POA: Diagnosis not present

## 2023-02-04 DIAGNOSIS — M5442 Lumbago with sciatica, left side: Secondary | ICD-10-CM | POA: Diagnosis not present

## 2023-02-04 DIAGNOSIS — M9905 Segmental and somatic dysfunction of pelvic region: Secondary | ICD-10-CM | POA: Diagnosis not present

## 2023-02-04 DIAGNOSIS — M9903 Segmental and somatic dysfunction of lumbar region: Secondary | ICD-10-CM | POA: Diagnosis not present

## 2023-02-10 DIAGNOSIS — E1122 Type 2 diabetes mellitus with diabetic chronic kidney disease: Secondary | ICD-10-CM | POA: Diagnosis not present

## 2023-02-10 DIAGNOSIS — N189 Chronic kidney disease, unspecified: Secondary | ICD-10-CM | POA: Diagnosis not present

## 2023-02-10 DIAGNOSIS — M1990 Unspecified osteoarthritis, unspecified site: Secondary | ICD-10-CM | POA: Diagnosis not present

## 2023-02-10 DIAGNOSIS — M8580 Other specified disorders of bone density and structure, unspecified site: Secondary | ICD-10-CM | POA: Diagnosis not present

## 2023-02-10 DIAGNOSIS — K219 Gastro-esophageal reflux disease without esophagitis: Secondary | ICD-10-CM | POA: Diagnosis not present

## 2023-02-10 DIAGNOSIS — N3941 Urge incontinence: Secondary | ICD-10-CM | POA: Diagnosis not present

## 2023-02-10 DIAGNOSIS — I2510 Atherosclerotic heart disease of native coronary artery without angina pectoris: Secondary | ICD-10-CM | POA: Diagnosis not present

## 2023-02-10 DIAGNOSIS — I129 Hypertensive chronic kidney disease with stage 1 through stage 4 chronic kidney disease, or unspecified chronic kidney disease: Secondary | ICD-10-CM | POA: Diagnosis not present

## 2023-02-10 DIAGNOSIS — Z809 Family history of malignant neoplasm, unspecified: Secondary | ICD-10-CM | POA: Diagnosis not present

## 2023-02-10 DIAGNOSIS — F419 Anxiety disorder, unspecified: Secondary | ICD-10-CM | POA: Diagnosis not present

## 2023-02-10 DIAGNOSIS — Z8249 Family history of ischemic heart disease and other diseases of the circulatory system: Secondary | ICD-10-CM | POA: Diagnosis not present

## 2023-02-10 DIAGNOSIS — E785 Hyperlipidemia, unspecified: Secondary | ICD-10-CM | POA: Diagnosis not present

## 2023-02-10 DIAGNOSIS — Z008 Encounter for other general examination: Secondary | ICD-10-CM | POA: Diagnosis not present

## 2023-02-11 DIAGNOSIS — M9905 Segmental and somatic dysfunction of pelvic region: Secondary | ICD-10-CM | POA: Diagnosis not present

## 2023-02-11 DIAGNOSIS — M9903 Segmental and somatic dysfunction of lumbar region: Secondary | ICD-10-CM | POA: Diagnosis not present

## 2023-02-11 DIAGNOSIS — M5442 Lumbago with sciatica, left side: Secondary | ICD-10-CM | POA: Diagnosis not present

## 2023-02-11 DIAGNOSIS — M9902 Segmental and somatic dysfunction of thoracic region: Secondary | ICD-10-CM | POA: Diagnosis not present

## 2023-02-12 ENCOUNTER — Ambulatory Visit: Payer: Medicare HMO | Admitting: Cardiovascular Disease

## 2023-02-12 ENCOUNTER — Encounter: Payer: Self-pay | Admitting: Cardiovascular Disease

## 2023-02-12 DIAGNOSIS — I2510 Atherosclerotic heart disease of native coronary artery without angina pectoris: Secondary | ICD-10-CM

## 2023-02-12 MED ORDER — NITROGLYCERIN 0.4 MG SL SUBL: 0.4000 mg | SUBLINGUAL_TABLET | SUBLINGUAL | 3 refills | Status: DC | PRN

## 2023-02-12 MED ORDER — NITROGLYCERIN 0.4 MG SL SUBL: 0.4000 mg | SUBLINGUAL_TABLET | SUBLINGUAL | 3 refills | Status: AC | PRN

## 2023-02-12 NOTE — Patient Instructions (Signed)
Medication Instructions:  Your physician recommends that you continue on your current medications as directed. Please refer to the Current Medication list given to you today.  *If you need a refill on your cardiac medications before your next appointment, please call your pharmacy*  Lab Work: If you have labs (blood work) drawn today and your tests are completely normal, you will receive your results only by: MyChart Message (if you have MyChart) OR A paper copy in the mail If you have any lab test that is abnormal or we need to change your treatment, we will call you to review the results.  Testing/Procedures: None ordered today.  Follow-Up: At [facility], you and your health needs are our priority.  As part of our continuing mission to provide you with exceptional heart care, we have created designated Provider Care Teams.  These Care Teams include your primary Cardiologist (physician) and Advanced Practice Providers (APPs -  Physician Assistants and Nurse Practitioners) who all work together to provide you with the care you need, when you need it.  We recommend signing up for the patient portal called "MyChart".  Sign up information is provided on this After Visit Summary.  MyChart is used to connect with patients for Virtual Visits (Telemedicine).  Patients are able to view lab/test results, encounter notes, upcoming appointments, etc.  Non-urgent messages can be sent to your provider as well.   To learn more about what you can do with MyChart, go to [email or web address].    Your next appointment:   6 month(s)  Provider:   Peter Nishan, MD      

## 2023-02-16 ENCOUNTER — Encounter: Payer: Self-pay | Admitting: Urology

## 2023-02-16 ENCOUNTER — Ambulatory Visit (INDEPENDENT_AMBULATORY_CARE_PROVIDER_SITE_OTHER): Payer: Medicare HMO | Admitting: Urology

## 2023-02-16 VITALS — BP 157/72 | HR 94

## 2023-02-16 DIAGNOSIS — R350 Frequency of micturition: Secondary | ICD-10-CM

## 2023-02-16 NOTE — Patient Instructions (Signed)

## 2023-02-16 NOTE — Progress Notes (Unsigned)
02/16/2023 3:41 PM   Erika Ramos [birth year] [id number]  Referring provider: Assunta Found, MD [address] [place],  [place] [address]  Followup urinary urgency   HPI: Erika Ramos is a 77yo here for followup for urinary frequency. She is on Gemtesa 75mg  which significantly improves her urinary frequency. She is on [place]. She is having nocturia 3-4x. Urine stream strong. No straining to urinate.    PMH: Past Medical History:  Diagnosis Date   Breast cancer (HCC)    Breast cancer, stage 2, right (HCC) 09/06/2009   Qualifier: Diagnosis of  By: Denyse Amass, CMA, Carol     Coronary artery disease    Diabetes mellitus without complication (HCC)    Fatty liver disease, nonalcoholic    GERD (gastroesophageal reflux disease)    Hypertension    PONV (postoperative nausea and vomiting)    Nausea/ Vomitting- last time 2010   Stented coronary artery     Surgical History: Past Surgical History:  Procedure Laterality Date   ABDOMINAL HYSTERECTOMY     BREAST SURGERY  March 2010   cataract  Right 09/2012   CATARACT EXTRACTION  01/01/2018   left eye   COLONOSCOPY WITH PROPOFOL N/A 07/05/2021   Procedure: COLONOSCOPY WITH PROPOFOL;  Surgeon: Lanelle Bal, DO;  Location: [facility];  Service: Endoscopy;  Laterality: N/A;  8:45am   FRACTURE SURGERY     wrist  right   LEFT HEART CATH AND CORONARY ANGIOGRAPHY N/A 09/10/2021   Procedure: LEFT HEART CATH AND CORONARY ANGIOGRAPHY;  Surgeon: Lyn Records, MD;  Location: [facility];  Service: Cardiovascular;  Laterality: N/A;   PARS PLANA VITRECTOMY  07/03/2011   Procedure: PARS PLANA VITRECTOMY WITH 25 GAUGE;  Surgeon: Sherrie George, MD;  Location: [facility];  Service: Ophthalmology;  Laterality: Right;  MEMBRANE PEEL, HEAD SCOPE LASER, GAS   POLYPECTOMY  07/05/2021   Procedure: POLYPECTOMY;  Surgeon: Lanelle Bal, DO;  Location: [facility];  Service: Endoscopy;;   RETINAL DETACHMENT SURGERY     RETINAL DETACHMENT  SURGERY  07/03/11   right   TONSILLECTOMY      Home Medications:  Allergies as of 02/16/2023       Reactions   Clopidogrel Bisulfate Hives   Oxycontin [oxycodone Hcl] Nausea And Vomiting        Medication List        Accurate as of February 16, 2023  3:41 PM. If you have any questions, ask your nurse or doctor.          Accu-Chek Guide Me w/Device Kit Use to check blood glucose four times daily   Accu-Chek Guide test strip Generic drug: glucose blood Use to check blood glucose four times daily as instructed   Accu-Chek Softclix Lancets lancets Use to check blood glucose four times daily as instructed   aspirin EC 81 MG tablet Take 81 mg by mouth daily.   Biotin 5000 MCG Chew Chew by mouth daily. Per patient taking 2 chews daily   Calcium-Vitamin D-Vitamin K 500-500-40 MG-UNT-MCG Chew Chew 1 each by mouth daily.   carvedilol 12.5 MG tablet Commonly known as: COREG Take 1 tablet (12.5 mg total) by mouth 2 (two) times daily with a meal.   CENTRUM SILVER ULTRA WOMENS PO Take 2 tablets by mouth daily.   dapagliflozin propanediol 10 MG Tabs tablet Commonly known as: FARXIGA Take 1 tablet (10 mg total) by mouth daily.   escitalopram 10 MG tablet Commonly known as: LEXAPRO  Take 10 mg by mouth daily.   fenofibrate 145 MG tablet Commonly known as: TRICOR TAKE 1 TABLET(145 MG) BY MOUTH DAILY   FISH OIL PO Take 1 capsule by mouth daily at 6 (six) AM. Per patient taking daily not taking at 6:00 am   Gemtesa 75 MG Tabs Generic drug: Vibegron Take 1 tablet (75 mg total) by mouth daily.   Janumet 50-1000 MG tablet Generic drug: sitaGLIPtin-metformin Take 1 tablet by mouth 2 (two) times daily with a meal.   nitroGLYCERIN 0.4 MG SL tablet Commonly known as: NITROSTAT Place 1 tablet (0.4 mg total) under the tongue every 5 (five) minutes as needed for chest pain.   omeprazole 20 MG capsule Commonly known as: PRILOSEC Take 20 mg by mouth daily.   ramipril 10  MG capsule Commonly known as: ALTACE Take 10 mg by mouth daily.   rosuvastatin 40 MG tablet Commonly known as: CRESTOR TAKE 1 TABLET(40 MG) BY MOUTH DAILY   VITAMIN B-12 PO Take 1 tablet by mouth daily.   vitamin C 250 MG tablet Commonly known as: ASCORBIC ACID Take 500 mg by mouth daily.   VITAMIN D3 GUMMIES ADULT PO Take 2,000 Units by mouth daily.   zinc gluconate 50 MG tablet Take 50 mg by mouth daily.        Allergies:  Allergies  Allergen Reactions   Clopidogrel Bisulfate Hives   Oxycontin [Oxycodone Hcl] Nausea And Vomiting    Family History: Family History  Problem Relation Age of Onset   Heart disease Mother    Ovarian cancer Mother    Stroke Father    Diabetes Sister    Diabetes Brother    Diabetes Sister    Heart disease Brother    Cancer Brother        spine   Anesthesia problems Neg Hx     Social History:  reports that she has never smoked. She has never used smokeless tobacco. She reports that she does not drink alcohol and does not use drugs.  ROS: All other review of systems were reviewed and are negative except what is noted above in HPI  Physical Exam: BP (!) 157/72   Pulse 94   Constitutional:  Alert and oriented, No acute distress. HEENT: [place] AT, moist mucus membranes.  Trachea midline, no masses. Cardiovascular: No clubbing, cyanosis, or edema. Respiratory: Normal respiratory effort, no increased work of breathing. GI: Abdomen is soft, nontender, nondistended, no abdominal masses GU: No CVA tenderness.  Lymph: No cervical or inguinal lymphadenopathy. Skin: No rashes, bruises or suspicious lesions. Neurologic: Grossly intact, no focal deficits, moving all 4 extremities. Psychiatric: Normal mood and affect.  Laboratory Data: Lab Results  Component Value Date   WBC 8.7 03/16/2022   HGB 13.3 03/16/2022   HCT 39.0 03/16/2022   MCV 86.4 03/16/2022   PLT 207 03/16/2022    Lab Results  Component Value Date   CREATININE 0.71  11/21/2022    No results found for: "PSA"  No results found for: "TESTOSTERONE"  Lab Results  Component Value Date   HGBA1C 9 11/24/2022    Urinalysis    Component Value Date/Time   APPEARANCEUR Clear 10/28/2022 1521   GLUCOSEU 3+ (A) 10/28/2022 1521   BILIRUBINUR Negative 10/28/2022 1521   PROTEINUR Negative 10/28/2022 1521   NITRITE Negative 10/28/2022 1521   LEUKOCYTESUR Negative 10/28/2022 1521    Lab Results  Component Value Date   LABMICR Comment 10/28/2022    Pertinent Imaging:  No results found for  this or any previous visit.  No results found for this or any previous visit.  No results found for this or any previous visit.  No results found for this or any previous visit.  No results found for this or any previous visit.  No valid procedures specified. No results found for this or any previous visit.  No results found for this or any previous visit.   Assessment & Plan:    1. Urinary frequency -Continue gemtesa 75mg  - Urinalysis, Routine w reflex microscopic   No follow-ups on file.  Wilkie Aye, MD  [facility] [place]

## 2023-02-17 LAB — URINALYSIS, ROUTINE W REFLEX MICROSCOPIC
Bilirubin, UA: NEGATIVE
Ketones, UA: NEGATIVE
Leukocytes,UA: NEGATIVE
Nitrite, UA: NEGATIVE
Protein,UA: NEGATIVE
RBC, UA: NEGATIVE
Specific Gravity, UA: 1.015 (ref 1.005–1.030)
Urobilinogen, Ur: 0.2 mg/dL (ref 0.2–1.0)
pH, UA: 6 (ref 5.0–7.5)

## 2023-03-03 ENCOUNTER — Ambulatory Visit: Payer: Medicare HMO | Admitting: "Endocrinology

## 2023-03-12 ENCOUNTER — Other Ambulatory Visit: Payer: Self-pay | Admitting: "Endocrinology

## 2023-03-12 MED ORDER — EMPAGLIFLOZIN 10 MG PO TABS: 10.0000 mg | ORAL_TABLET | Freq: Every day | ORAL | 1 refills | Status: DC

## 2023-05-01 DIAGNOSIS — S338XXA Sprain of other parts of lumbar spine and pelvis, initial encounter: Secondary | ICD-10-CM | POA: Diagnosis not present

## 2023-05-01 DIAGNOSIS — M1612 Unilateral primary osteoarthritis, left hip: Secondary | ICD-10-CM | POA: Diagnosis not present

## 2023-05-04 DIAGNOSIS — S338XXA Sprain of other parts of lumbar spine and pelvis, initial encounter: Secondary | ICD-10-CM | POA: Diagnosis not present

## 2023-05-04 DIAGNOSIS — M1612 Unilateral primary osteoarthritis, left hip: Secondary | ICD-10-CM | POA: Diagnosis not present

## 2023-05-06 DIAGNOSIS — M1612 Unilateral primary osteoarthritis, left hip: Secondary | ICD-10-CM | POA: Diagnosis not present

## 2023-05-06 DIAGNOSIS — S338XXA Sprain of other parts of lumbar spine and pelvis, initial encounter: Secondary | ICD-10-CM | POA: Diagnosis not present

## 2023-05-11 DIAGNOSIS — M9903 Segmental and somatic dysfunction of lumbar region: Secondary | ICD-10-CM | POA: Diagnosis not present

## 2023-05-11 DIAGNOSIS — M1612 Unilateral primary osteoarthritis, left hip: Secondary | ICD-10-CM | POA: Diagnosis not present

## 2023-05-11 DIAGNOSIS — S338XXA Sprain of other parts of lumbar spine and pelvis, initial encounter: Secondary | ICD-10-CM | POA: Diagnosis not present

## 2023-05-13 ENCOUNTER — Ambulatory Visit (INDEPENDENT_AMBULATORY_CARE_PROVIDER_SITE_OTHER): Payer: Medicare HMO | Admitting: Orthopaedic Surgery

## 2023-05-13 ENCOUNTER — Other Ambulatory Visit (INDEPENDENT_AMBULATORY_CARE_PROVIDER_SITE_OTHER): Payer: Medicare HMO

## 2023-05-13 ENCOUNTER — Encounter: Payer: Self-pay | Admitting: Orthopaedic Surgery

## 2023-05-13 DIAGNOSIS — M25552 Pain in left hip: Secondary | ICD-10-CM

## 2023-05-13 MED ORDER — CELECOXIB 200 MG PO CAPS: 200.0000 mg | ORAL_CAPSULE | Freq: Two times a day (BID) | ORAL | 1 refills | Status: DC | PRN

## 2023-05-13 NOTE — Progress Notes (Signed)
The patient is a 77 year old female that I am seeing for left hip pain.  She had a fall about a year ago and is continuing to have pain in her left hip.  She does have a cane but has not used it.  She sees her chiropractor who feels like this may be an issue with arthritis.  She does have a little bit of groin pain and sciatic type of pain.  She denies any pain goes to her knee and the groin pain is minimal.  She is someone who is a diabetic.  5 months ago her last hemoglobin A1c was 9.  She has not been under great blood glucose control.  I was able to review other past medical history and medications within epic.  Examination of her right and left hip show that both have fluid range of motion with little bit of pain on the left hip in the groin.  There is no pain to palpation over the left hip trochanteric area or sciatic area.  An AP pelvis and lateral left hip shows some mild arthritic changes.  She said the x-rays and exam gave her reassurance.  I would like her to use a cane in her opposite hand for about 6 weeks and this can offload her hip.  I have recommended an anti-inflammatory such as Celebrex.  She is not a good candidate for steroid injection right now and her blood glucose is under better control but if it does get to the point where in injection would be beneficial with a steroid, we would set this up through one of our partners who can do this under ultrasound guidance such as Dr. Shon Baton.  She understands that as well.  Follow-up can be as needed.  All questions and concerns were addressed and answered.

## 2023-05-13 NOTE — Progress Notes (Signed)
x

## 2023-05-14 ENCOUNTER — Ambulatory Visit: Payer: Medicare HMO | Admitting: Orthopaedic Surgery

## 2023-05-15 DIAGNOSIS — S338XXA Sprain of other parts of lumbar spine and pelvis, initial encounter: Secondary | ICD-10-CM | POA: Diagnosis not present

## 2023-05-15 DIAGNOSIS — M1612 Unilateral primary osteoarthritis, left hip: Secondary | ICD-10-CM | POA: Diagnosis not present

## 2023-05-18 ENCOUNTER — Other Ambulatory Visit (HOSPITAL_COMMUNITY): Payer: Self-pay | Admitting: Family Medicine

## 2023-05-18 DIAGNOSIS — Z1231 Encounter for screening mammogram for malignant neoplasm of breast: Secondary | ICD-10-CM

## 2023-05-19 ENCOUNTER — Telehealth: Payer: Self-pay

## 2023-05-19 NOTE — Telephone Encounter (Signed)
Samples provided 

## 2023-05-19 NOTE — Telephone Encounter (Signed)
Patient is needing Gemtesa samples. Doctor McKenzie asked her to call back to ask for them.  Please call patient to let her know when or if we have any samples.

## 2023-05-25 ENCOUNTER — Other Ambulatory Visit: Payer: Self-pay | Admitting: "Endocrinology

## 2023-05-25 DIAGNOSIS — E119 Type 2 diabetes mellitus without complications: Secondary | ICD-10-CM

## 2023-05-27 ENCOUNTER — Ambulatory Visit (HOSPITAL_COMMUNITY)
Admission: RE | Admit: 2023-05-27 | Discharge: 2023-05-27 | Disposition: A | Discharge status: Home / Self Care | Payer: Medicare HMO | Source: Ambulatory Visit | Attending: Family Medicine | Admitting: Family Medicine

## 2023-05-27 DIAGNOSIS — Z1231 Encounter for screening mammogram for malignant neoplasm of breast: Secondary | ICD-10-CM | POA: Diagnosis not present

## 2023-06-02 ENCOUNTER — Other Ambulatory Visit (HOSPITAL_COMMUNITY): Payer: Self-pay | Admitting: Family Medicine

## 2023-06-02 DIAGNOSIS — R928 Other abnormal and inconclusive findings on diagnostic imaging of breast: Secondary | ICD-10-CM

## 2023-06-09 ENCOUNTER — Ambulatory Visit (HOSPITAL_COMMUNITY)
Admission: RE | Admit: 2023-06-09 | Discharge: 2023-06-09 | Disposition: A | Discharge status: Home / Self Care | Payer: Medicare HMO | Source: Ambulatory Visit | Attending: Family Medicine | Admitting: Family Medicine

## 2023-06-09 DIAGNOSIS — R928 Other abnormal and inconclusive findings on diagnostic imaging of breast: Secondary | ICD-10-CM | POA: Insufficient documentation

## 2023-06-09 DIAGNOSIS — R921 Mammographic calcification found on diagnostic imaging of breast: Secondary | ICD-10-CM | POA: Diagnosis not present

## 2023-06-09 DIAGNOSIS — Z853 Personal history of malignant neoplasm of breast: Secondary | ICD-10-CM | POA: Diagnosis not present

## 2023-06-09 DIAGNOSIS — R92322 Mammographic fibroglandular density, left breast: Secondary | ICD-10-CM | POA: Diagnosis not present

## 2023-06-10 DIAGNOSIS — D225 Melanocytic nevi of trunk: Secondary | ICD-10-CM | POA: Diagnosis not present

## 2023-06-10 DIAGNOSIS — L821 Other seborrheic keratosis: Secondary | ICD-10-CM | POA: Diagnosis not present

## 2023-06-10 DIAGNOSIS — Z1283 Encounter for screening for malignant neoplasm of skin: Secondary | ICD-10-CM | POA: Diagnosis not present

## 2023-06-15 ENCOUNTER — Encounter: Payer: Self-pay | Admitting: "Endocrinology

## 2023-06-15 ENCOUNTER — Ambulatory Visit (INDEPENDENT_AMBULATORY_CARE_PROVIDER_SITE_OTHER): Payer: Medicare HMO | Admitting: "Endocrinology

## 2023-06-15 VITALS — BP 130/66 | HR 68 | Ht 65.0 in | Wt 165.2 lb

## 2023-06-15 DIAGNOSIS — E119 Type 2 diabetes mellitus without complications: Secondary | ICD-10-CM

## 2023-06-15 DIAGNOSIS — Z7984 Long term (current) use of oral hypoglycemic drugs: Secondary | ICD-10-CM | POA: Insufficient documentation

## 2023-06-15 DIAGNOSIS — I10 Essential (primary) hypertension: Secondary | ICD-10-CM | POA: Diagnosis not present

## 2023-06-15 DIAGNOSIS — E782 Mixed hyperlipidemia: Secondary | ICD-10-CM

## 2023-06-15 LAB — POCT GLYCOSYLATED HEMOGLOBIN (HGB A1C): HbA1c, POC (controlled diabetic range): 9.4 % — AB (ref 0.0–7.0)

## 2023-06-15 MED ORDER — DAPAGLIFLOZIN PROPANEDIOL 10 MG PO TABS: 10.0000 mg | ORAL_TABLET | Freq: Every day | ORAL | 1 refills | Status: DC

## 2023-06-15 NOTE — Progress Notes (Signed)
06/15/2023, 6:56 PM  Endocrinology follow-up note   Subjective:    Patient ID: Erika Ramos, female    DOB: Apr 06, 1946.  Erika Ramos is being seen in follow-up after she was seen in consultation for management of currently uncontrolled symptomatic diabetes requested by  Assunta Found, MD.   Past Medical History:  Diagnosis Date   Breast cancer [facility])    Breast cancer, stage 2, right (HCC) 09/06/2009   Qualifier: Diagnosis of  By: Denyse Amass, CMA, Carol     Coronary artery disease    Diabetes mellitus without complication (HCC)    Fatty liver disease, nonalcoholic    GERD (gastroesophageal reflux disease)    Hypertension    PONV (postoperative nausea and vomiting)    Nausea/ Vomitting- last time 2010   Stented coronary artery     Past Surgical History:  Procedure Laterality Date   ABDOMINAL HYSTERECTOMY     BREAST SURGERY  March 2010   cataract  Right 09/2012   CATARACT EXTRACTION  01/01/2018   left eye   COLONOSCOPY WITH PROPOFOL N/A 07/05/2021   Procedure: COLONOSCOPY WITH PROPOFOL;  Surgeon: Lanelle Bal, DO;  Location: [facility];  Service: Endoscopy;  Laterality: N/A;  8:45am   FRACTURE SURGERY     wrist  right   LEFT HEART CATH AND CORONARY ANGIOGRAPHY N/A 09/10/2021   Procedure: LEFT HEART CATH AND CORONARY ANGIOGRAPHY;  Surgeon: Lyn Records, MD;  Location: [facility];  Service: Cardiovascular;  Laterality: N/A;   PARS PLANA VITRECTOMY  07/03/2011   Procedure: PARS PLANA VITRECTOMY WITH 25 GAUGE;  Surgeon: Sherrie George, MD;  Location: [facility] OR;  Service: Ophthalmology;  Laterality: Right;  MEMBRANE PEEL, HEAD SCOPE LASER, GAS   POLYPECTOMY  07/05/2021   Procedure: POLYPECTOMY;  Surgeon: Lanelle Bal, DO;  Location: [facility];  Service: Endoscopy;;   RETINAL DETACHMENT SURGERY     RETINAL DETACHMENT SURGERY  07/03/11   right   TONSILLECTOMY       Social History   Socioeconomic History   Marital status: Widowed    Spouse name: Not on file   Number of children: Not on file   Years of education: Not on file   Highest education level: Not on file  Occupational History   Not on file  Tobacco Use   Smoking status: Never   Smokeless tobacco: Never  Vaping Use   Vaping status: Never Used  Substance and Sexual Activity   Alcohol use: No   Drug use: No   Sexual activity: Not on file    Comment: single-2 grown children  Other Topics Concern   Not on file  Social History Narrative   Not on file   Social Drivers of Health   Financial Resource Strain: Not on file  Food Insecurity: Not on file  Transportation Needs: Not on file  Physical Activity: Not on file  Stress: Not on file  Social Connections: Not on file    Family History  Problem Relation Age of Onset   Heart disease Mother    Ovarian cancer Mother  Stroke Father    Diabetes Sister    Diabetes Brother    Diabetes Sister    Heart disease Brother    Cancer Brother        spine   Anesthesia problems Neg Hx     Outpatient Encounter Medications as of 06/15/2023  Medication Sig   dapagliflozin propanediol (FARXIGA) 10 MG TABS tablet Take 1 tablet (10 mg total) by mouth daily with breakfast.   MAGNESIUM CITRATE PO Take 1 tablet by mouth daily.   Accu-Chek Softclix Lancets lancets USE TO CHECK BLOOD GLUCOSE FOUR TIMES DAILY AS DIRECTED   aspirin EC 81 MG tablet Take 81 mg by mouth daily.   Biotin 5000 MCG CHEW Chew by mouth daily. Per patient taking 2 chews daily   Blood Glucose Monitoring Suppl (ACCU-CHEK GUIDE ME) w/Device KIT Use to check blood glucose four times daily   Calcium-Vitamin D-Vitamin K 500-500-40 MG-UNT-MCG CHEW Chew 1 each by mouth daily.   carvedilol (COREG) 12.5 MG tablet Take 1 tablet (12.5 mg total) by mouth 2 (two) times daily with a meal.   celecoxib (CELEBREX) 200 MG capsule Take 1 capsule (200 mg total) by mouth 2 (two) times daily  between meals as needed.   Cholecalciferol (VITAMIN D3 GUMMIES ADULT PO) Take 2,000 Units by mouth daily.   Cyanocobalamin (VITAMIN B-12 PO) Take 1 tablet by mouth daily.   escitalopram (LEXAPRO) 10 MG tablet Take 10 mg by mouth daily.   fenofibrate (TRICOR) 145 MG tablet TAKE 1 TABLET(145 MG) BY MOUTH DAILY   glucose blood (ACCU-CHEK GUIDE) test strip Use to check blood glucose four times daily as instructed   JANUMET 50-1000 MG tablet Take 1 tablet by mouth 2 (two) times daily with a meal.   Multiple Vitamins-Minerals (CENTRUM SILVER ULTRA WOMENS PO) Take 2 tablets by mouth daily.   nitroGLYCERIN (NITROSTAT) 0.4 MG SL tablet Place 1 tablet (0.4 mg total) under the tongue every 5 (five) minutes as needed for chest pain.   Omega-3 Fatty Acids (FISH OIL PO) Take 1 capsule by mouth daily at 6 (six) AM. Per patient taking daily not taking at 6:00 am   omeprazole (PRILOSEC) 20 MG capsule Take 20 mg by mouth daily.   ramipril (ALTACE) 10 MG capsule Take 10 mg by mouth daily.   rosuvastatin (CRESTOR) 40 MG tablet TAKE 1 TABLET(40 MG) BY MOUTH DAILY   Vibegron (GEMTESA) 75 MG TABS Take 1 tablet (75 mg total) by mouth daily.   vitamin C (ASCORBIC ACID) 250 MG tablet Take 500 mg by mouth daily.   zinc gluconate 50 MG tablet Take 50 mg by mouth daily.   [DISCONTINUED] empagliflozin (JARDIANCE) 10 MG TABS tablet Take 1 tablet (10 mg total) by mouth daily before breakfast.   No facility-administered encounter medications on file as of 06/15/2023.    ALLERGIES: Allergies  Allergen Reactions   Clopidogrel Bisulfate Hives   Oxycontin [Oxycodone Hcl] Nausea And Vomiting    VACCINATION STATUS: Immunization History  Administered Date(s) Administered   Influenza,inj,Quad PF,6+ Mos 04/03/2016   PFIZER(Purple Top)SARS-COV-2 Vaccination 07/31/2019, 08/24/2019    Diabetes She presents for Erika Ramos follow-up diabetic visit. She has type 2 diabetes mellitus. Onset time: She was diagnosed at approximate age of  73 years. Erika Ramos disease course has been worsening. There are no hypoglycemic associated symptoms. Pertinent negatives for hypoglycemia include no confusion, headaches, pallor or seizures. Associated symptoms include fatigue and polyuria. Pertinent negatives for diabetes include no chest pain, no polydipsia and no polyphagia. There are  no hypoglycemic complications. Symptoms are worsening. There are no diabetic complications. Risk factors for coronary artery disease include diabetes mellitus, dyslipidemia, hypertension, family history, post-menopausal and sedentary lifestyle. Current diabetic treatments: She is currently on Farxiga 10 mg p.o. daily at breakfast, Janumet 50/1000 mg twice daily. Erika Ramos weight is increasing steadily. She is following a generally unhealthy diet. When asked about meal planning, she reported none. She has not had a previous visit with a dietitian. She rarely participates in exercise. Erika Ramos home blood glucose trend is decreasing steadily. Erika Ramos breakfast blood glucose range is generally >200 mg/dl. Erika Ramos dinner blood glucose range is generally >200 mg/dl. Erika Ramos bedtime blood glucose range is generally >200 mg/dl. Erika Ramos overall blood glucose range is >200 mg/dl. (She did bring Erika Ramos meter and logs averaging blood glucose of 197 over the last 7 days, point-of-care A1c of 9% improving from 10.8%.  She denies hypoglycemia.    ) An ACE inhibitor/angiotensin II receptor blocker is being taken.  Hypertension This is a chronic problem. The current episode started more than 1 year ago. The problem is controlled. Pertinent negatives include no chest pain, headaches, palpitations or shortness of breath. Risk factors for coronary artery disease include diabetes mellitus, sedentary lifestyle, post-menopausal state, dyslipidemia and family history. Past treatments include ACE inhibitors. The current treatment provides moderate improvement.  Hyperlipidemia This is a chronic problem. The current episode started more  than 1 year ago. Exacerbating diseases include diabetes. Pertinent negatives include no chest pain, myalgias or shortness of breath. Current antihyperlipidemic treatment includes statins. Risk factors for coronary artery disease include diabetes mellitus, dyslipidemia, hypertension, a sedentary lifestyle and post-menopausal.     Review of Systems  Constitutional:  Positive for fatigue. Negative for chills, fever and unexpected weight change.  HENT:  Negative for trouble swallowing and voice change.   Eyes:  Negative for visual disturbance.  Respiratory:  Negative for cough, shortness of breath and wheezing.   Cardiovascular:  Negative for chest pain, palpitations and leg swelling.  Gastrointestinal:  Negative for diarrhea, nausea and vomiting.  Endocrine: Positive for polyuria. Negative for cold intolerance, heat intolerance, polydipsia and polyphagia.  Musculoskeletal:  Negative for arthralgias and myalgias.  Skin:  Negative for color change, pallor, rash and wound.  Neurological:  Negative for seizures and headaches.  Psychiatric/Behavioral:  Negative for confusion and suicidal ideas.     Objective:       06/15/2023    1:18 PM 02/16/2023    3:30 PM 11/24/2022    1:09 PM  Vitals with BMI  Height 5\' 5"   5\' 5"   Weight 165 lbs 3 oz  167 lbs  BMI 27.49  27.79  Systolic 130 157 528  Diastolic 66 72 64  Pulse 68 94 76    BP 130/66   Pulse 68   Ht 5\' 5"  (1.651 m)   Wt 165 lb 3.2 oz (74.9 kg)   BMI 27.49 kg/m   Wt Readings from Last 3 Encounters:  06/15/23 165 lb 3.2 oz (74.9 kg)  11/24/22 167 lb (75.8 kg)  07/31/22 166 lb (75.3 kg)      CMP ( most recent) CMP     Component Value Date/Time   NA 138 11/21/2022 1124   K 4.6 11/21/2022 1124   CL 99 11/21/2022 1124   CO2 23 11/21/2022 1124   GLUCOSE 186 (H) 11/21/2022 1124   GLUCOSE 286 (H) 03/16/2022 2002   BUN 14 11/21/2022 1124   CREATININE 0.71 11/21/2022 1124   CALCIUM 10.2 11/21/2022 1124  PROT 6.7 11/21/2022 1124    ALBUMIN 4.2 11/21/2022 1124   AST 29 11/21/2022 1124   ALT 23 11/21/2022 1124   ALKPHOS 59 11/21/2022 1124   BILITOT 0.3 11/21/2022 1124   GFRNONAA >60 03/16/2022 1948   GFRAA 52 (L) 07/17/2017 2118     Diabetic Labs (most recent): Lab Results  Component Value Date   HGBA1C 9.4 (A) 06/15/2023   HGBA1C 9 11/24/2022   HGBA1C 10.8 (A) 06/12/2022     Lipid Panel ( most recent) Lipid Panel     Component Value Date/Time   CHOL 144 11/21/2022 1124   TRIG 113 11/21/2022 1124   HDL 44 11/21/2022 1124   CHOLHDL 3.3 11/21/2022 1124   CHOLHDL 3.4 12/30/2021 1445   VLDL 49 (H) 12/30/2021 1445   LDLCALC 79 11/21/2022 1124   LABVLDL 21 11/21/2022 1124      Assessment & Plan:   1. Type 2 diabetes mellitus without complication, without long-term current use of insulin (HCC)   - Erika Ramos has currently uncontrolled symptomatic type 2 DM since  77 years of age. She brought Erika Ramos meter showing significant above target glycemic profile averaging 212-245 mg per DL.  Erika Ramos point-of-care A1c is 9.4% increasing from 9% during Erika Ramos last visit.  Please   - I had a long discussion with Erika Ramos about the possible risk factors and  the pathology behind its diabetes and its complications. -Erika Ramos diabetes is complicated by sedentary life, dietary indiscretions and she remains at a high risk for more acute and chronic complications which include CAD, CVA, CKD, retinopathy, and neuropathy. These are all discussed in detail with Erika Ramos.  - I discussed all available options of managing Erika Ramos diabetes including de-escalation of medications. I have counseled Erika Ramos on Food as Medicine by adopting a Whole Food , Plant Predominant  ( WFPP) nutrition as recommended by [employer] of Lifestyle Medicine. Patient is encouraged to switch to  unprocessed or minimally processed  complex starch, adequate protein intake (mainly plant source), minimal liquid fat, plenty of fruits, and vegetables. -  she is advised to  stick to a routine mealtimes to eat 3 complete meals a day and snack only when necessary ( to snack only to correct hypoglycemia BG <70 day time or <100 at night).  Patient made significant change in Erika Ramos diet and benefiting from lifestyle medicine. In light of Erika Ramos desire to avoid injectable treatment for diabetes she remains a good candidate for lifestyle medicine.  - she acknowledges that there is a room for improvement in Erika Ramos food and drink choices. - Suggestion is made for Erika Ramos to avoid simple carbohydrates  from Erika Ramos diet including Cakes, Sweet Desserts, Ice Cream, Soda (diet and regular), Sweet Tea, Candies, Chips, Cookies, Store Bought Juices, Alcohol , Artificial Sweeteners,  Coffee Creamer, and "Sugar-free" Products, Lemonade. This will help patient to have more stable blood glucose profile and potentially avoid unintended weight gain.  The following Lifestyle Medicine recommendations according to American College of Lifestyle Medicine  [facility]) were discussed and and offered to patient and she  agrees to start the journey:  A. Whole Foods, Plant-Based Nutrition comprising of fruits and vegetables, plant-based proteins, whole-grain carbohydrates was discussed in detail with the patient.   A list for source of those nutrients were also provided to the patient.  Patient will use only water or unsweetened tea for hydration. B.  The need to stay away from risky substances including alcohol, smoking; obtaining 7 to 9 hours of restorative  sleep, at least 150 minutes of moderate intensity exercise weekly, the importance of healthy social connections,  and stress management techniques were discussed. C.  A full color page of  Calorie density of various food groups per pound showing examples of each food groups was provided to the patient.   - she will be scheduled with Norm Salt, RDN, CDE for individualized diabetes education.  - I have approached Erika Ramos with the following individualized plan to manage   Erika Ramos diabetes and patient agrees:   -In light of Erika Ramos presentation with significantly above target glycemic profile, she was offered basal insulin intervention, however she declined this option at this time.    She has not engaged with lifestyle medicine.   -She will need insulin treatment in order for Erika Ramos to achieve control of diabetes to target unless she optimally engages with lifestyle medicine.  -She promises to do better and would like to see Erika Ramos results at the next visit.    -In the meantime, she is advised to continue Janumet 50/1000 mg p.o. twice daily.   She is advised to continue Farxiga 10 mg p.o. daily at breakfast.  Side effects and precautions discussed with Erika Ramos.  She also declines any injectable options including GLP-1 receptor agonists.  She is willing to monitor blood glucose twice a day-daily before breakfast and at bedtime.    - she is encouraged to call clinic for blood glucose levels less than 70 or above 300 mg /dl.  - Specific targets for  A1c;  LDL, HDL,  and Triglycerides were discussed with the patient.  2) Blood Pressure /Hypertension:   Erika Ramos blood pressure is controlled to target.  she is advised to continue Erika Ramos current medications including ramipril 10 mg p.o. daily with breakfast .   3) Lipids/Hyperlipidemia:   Erika Ramos lipid panel is controlled will LDL of 79.  she  is advised to continue Crestor 40 mg p.o. daily at bedtime.   Side effects and precautions discussed with Erika Ramos.  4)  Weight/Diet:  Body mass index is 27.49 kg/m.  -   she is  a candidate for some weight loss. I discussed with Erika Ramos the fact that loss of 5 - 10% of Erika Ramos  current body weight will have the most impact on Erika Ramos diabetes management.  The above detailed  [facility] recommendations for nutrition, exercise, sleep, social life, avoidance of risky substances, the need for restorative sleep   information will also detailed on discharge instructions.  5) Chronic Care/Health Maintenance:  -she  is on ACEI/ARB  and Statin medications and  is encouraged to initiate and continue to follow up with Ophthalmology, Dentist,  Podiatrist at least yearly or according to recommendations, and advised to   stay away from smoking. I have recommended yearly flu vaccine and pneumonia vaccine at least every 5 years; moderate intensity exercise for up to 150 minutes weekly; and  sleep for 7- 9 hours a day.  - she is  advised to maintain close follow up with Assunta Found, MD for primary care needs, as well as Erika Ramos other providers for optimal and coordinated care.   I spent  26  minutes in the care of the patient today including review of labs from CMP, Lipids, Thyroid Function, Hematology (current and previous including abstractions from other facilities); face-to-face time discussing  Erika Ramos blood glucose readings/logs, discussing hypoglycemia and hyperglycemia episodes and symptoms, medications doses, Erika Ramos options of short and long term treatment based on the latest standards of care / guidelines;  discussion about incorporating lifestyle medicine;  and documenting the encounter. Risk reduction counseling performed per USPSTF guidelines to reduce  cardiovascular risk factors.     Please refer to Patient Instructions for Blood Glucose Monitoring and Insulin/Medications Dosing Guide"  in media tab for additional information. Please  also refer to " Patient Self Inventory" in the Media  tab for reviewed elements of pertinent patient history.  Erika Ramos participated in the discussions, expressed understanding, and voiced agreement with the above plans.  All questions were answered to Erika Ramos satisfaction. she is encouraged to contact clinic should she have any questions or concerns prior to Erika Ramos return visit.    Follow up plan: - Return in about 3 months (around 09/13/2023) for Bring Meter/CGM Device/Logs- A1c in Office.  Marquis Lunch, MD [facility] [address] [place], [place] [address] Phone: [phone]  Fax: [phone]    06/15/2023, 6:56 PM  This note was partially dictated with voice recognition software. Similar sounding words can be transcribed inadequately or may not  be corrected upon review.

## 2023-06-15 NOTE — Patient Instructions (Signed)

## 2023-06-30 ENCOUNTER — Telehealth: Payer: Self-pay | Admitting: Urology

## 2023-06-30 NOTE — Telephone Encounter (Signed)
 Needs Gemtesa samples

## 2023-07-02 NOTE — Telephone Encounter (Signed)
 Samples placed up front and patient was made aware.

## 2023-07-08 ENCOUNTER — Other Ambulatory Visit: Payer: Self-pay | Admitting: Orthopaedic Surgery

## 2023-07-20 ENCOUNTER — Other Ambulatory Visit: Payer: Self-pay

## 2023-07-20 DIAGNOSIS — E119 Type 2 diabetes mellitus without complications: Secondary | ICD-10-CM

## 2023-07-20 DIAGNOSIS — Z7984 Long term (current) use of oral hypoglycemic drugs: Secondary | ICD-10-CM

## 2023-07-20 MED ORDER — ACCU-CHEK GUIDE TEST VI STRP: 1.0000 | ORAL_STRIP | Freq: Two times a day (BID) | 2 refills | Status: DC

## 2023-07-20 MED ORDER — JANUMET 50-1000 MG PO TABS: 1.0000 | ORAL_TABLET | Freq: Two times a day (BID) | ORAL | 1 refills | Status: DC

## 2023-07-20 MED ORDER — ACCU-CHEK SOFTCLIX LANCETS MISC: 2 refills | Status: AC

## 2023-07-23 NOTE — Telephone Encounter (Signed)
Patient called she needs samples of Gemtesa it is over 100 dollars for her to get them .

## 2023-07-24 NOTE — Telephone Encounter (Signed)
Samples at front desk, pt informed via mychart.

## 2023-08-10 ENCOUNTER — Other Ambulatory Visit: Payer: Self-pay | Admitting: Orthopaedic Surgery

## 2023-08-19 ENCOUNTER — Ambulatory Visit: Payer: Medicare HMO | Admitting: Urology

## 2023-08-19 VITALS — BP 139/70 | HR 88

## 2023-08-19 DIAGNOSIS — R350 Frequency of micturition: Secondary | ICD-10-CM | POA: Diagnosis not present

## 2023-08-19 MED ORDER — GEMTESA 75 MG PO TABS: 1.0000 | ORAL_TABLET | Freq: Every day | ORAL | 11 refills | Status: AC

## 2023-08-19 NOTE — Progress Notes (Signed)
 08/19/2023 3:20 PM   MARISELA LINE [birth year] [id number]  Referring provider: Assunta Found, MD [address] [place],  [place] [address]  Followup OAB   HPI: Erika Ramos is a [phone] here for followup for urinary frequency and OAB. No UTIs since last visit. She remains on gemtesa 75mg  daily. She has mild urinary urgency which does not bother her. She denies straining to urinate. No other complaints today   PMH: Past Medical History:  Diagnosis Date   Breast cancer (HCC)    Breast cancer, stage 2, right (HCC) 09/06/2009   Qualifier: Diagnosis of  By: Denyse Amass, CMA, Carol     Coronary artery disease    Diabetes mellitus without complication (HCC)    Fatty liver disease, nonalcoholic    GERD (gastroesophageal reflux disease)    Hypertension    PONV (postoperative nausea and vomiting)    Nausea/ Vomitting- last time 2010   Stented coronary artery     Surgical History: Past Surgical History:  Procedure Laterality Date   ABDOMINAL HYSTERECTOMY     BREAST SURGERY  March 2010   cataract  Right 09/2012   CATARACT EXTRACTION  01/01/2018   left eye   COLONOSCOPY WITH PROPOFOL N/A 07/05/2021   Procedure: COLONOSCOPY WITH PROPOFOL;  Surgeon: Lanelle Bal, DO;  Location: [facility];  Service: Endoscopy;  Laterality: N/A;  8:45am   FRACTURE SURGERY     wrist  right   LEFT HEART CATH AND CORONARY ANGIOGRAPHY N/A 09/10/2021   Procedure: LEFT HEART CATH AND CORONARY ANGIOGRAPHY;  Surgeon: Lyn Records, MD;  Location: [facility];  Service: Cardiovascular;  Laterality: N/A;   PARS PLANA VITRECTOMY  07/03/2011   Procedure: PARS PLANA VITRECTOMY WITH 25 GAUGE;  Surgeon: Sherrie George, MD;  Location: [facility] OR;  Service: Ophthalmology;  Laterality: Right;  MEMBRANE PEEL, HEAD SCOPE LASER, GAS   POLYPECTOMY  07/05/2021   Procedure: POLYPECTOMY;  Surgeon: Lanelle Bal, DO;  Location: [facility];  Service: Endoscopy;;   RETINAL DETACHMENT SURGERY     RETINAL DETACHMENT  SURGERY  07/03/11   right   TONSILLECTOMY      Home Medications:  Allergies as of 08/19/2023       Reactions   Clopidogrel Bisulfate Hives   Oxycontin [oxycodone Hcl] Nausea And Vomiting        Medication List        Accurate as of August 19, 2023  3:20 PM. If you have any questions, ask your nurse or doctor.          Accu-Chek Guide Me w/Device Kit Use to check blood glucose four times daily   Accu-Chek Guide Test test strip Generic drug: glucose blood 1 each by Other route in the morning and at bedtime. Use as instructed   Accu-Chek Softclix Lancets lancets USE TO CHECK BLOOD GLUCOSE TWO TIMES DAILY AS DIRECTED   aspirin EC 81 MG tablet Take 81 mg by mouth daily.   Biotin 5000 MCG Chew Chew by mouth daily. Per patient taking 2 chews daily   Calcium-Vitamin D-Vitamin K 500-500-40 MG-UNT-MCG Chew Chew 1 each by mouth daily.   carvedilol 12.5 MG tablet Commonly known as: COREG Take 1 tablet (12.5 mg total) by mouth 2 (two) times daily with a meal.   celecoxib 200 MG capsule Commonly known as: CELEBREX TAKE 1 CAPSULE BY MOUTH TWICE DAILY BETWEEN MEALS AS NEEDED   CENTRUM SILVER ULTRA WOMENS PO Take 2 tablets by mouth daily.  dapagliflozin propanediol 10 MG Tabs tablet Commonly known as: Farxiga Take 1 tablet (10 mg total) by mouth daily with breakfast.   escitalopram 10 MG tablet Commonly known as: LEXAPRO Take 10 mg by mouth daily.   fenofibrate 145 MG tablet Commonly known as: TRICOR TAKE 1 TABLET(145 MG) BY MOUTH DAILY   FISH OIL PO Take 1 capsule by mouth daily at 6 (six) AM. Per patient taking daily not taking at 6:00 am   Gemtesa 75 MG Tabs Generic drug: Vibegron Take 1 tablet (75 mg total) by mouth daily.   Janumet 50-1000 MG tablet Generic drug: sitaGLIPtin-metformin Take 1 tablet by mouth 2 (two) times daily with a meal.   MAGNESIUM CITRATE PO Take 1 tablet by mouth daily.   nitroGLYCERIN 0.4 MG SL tablet Commonly known as:  NITROSTAT Place 1 tablet (0.4 mg total) under the tongue every 5 (five) minutes as needed for chest pain.   omeprazole 20 MG capsule Commonly known as: PRILOSEC Take 20 mg by mouth daily.   ramipril 10 MG capsule Commonly known as: ALTACE Take 10 mg by mouth daily.   rosuvastatin 40 MG tablet Commonly known as: CRESTOR TAKE 1 TABLET(40 MG) BY MOUTH DAILY   VITAMIN B-12 PO Take 1 tablet by mouth daily.   vitamin C 250 MG tablet Commonly known as: ASCORBIC ACID Take 500 mg by mouth daily.   VITAMIN D3 GUMMIES ADULT PO Take 2,000 Units by mouth daily.   zinc gluconate 50 MG tablet Take 50 mg by mouth daily.        Allergies:  Allergies  Allergen Reactions   Clopidogrel Bisulfate Hives   Oxycontin [Oxycodone Hcl] Nausea And Vomiting    Family History: Family History  Problem Relation Age of Onset   Heart disease Mother    Ovarian cancer Mother    Stroke Father    Diabetes Sister    Diabetes Brother    Diabetes Sister    Heart disease Brother    Cancer Brother        spine   Anesthesia problems Neg Hx     Social History:  reports that she has never smoked. She has never used smokeless tobacco. She reports that she does not drink alcohol and does not use drugs.  ROS: All other review of systems were reviewed and are negative except what is noted above in HPI  Physical Exam: BP 139/70   Pulse 88   Constitutional:  Alert and oriented, No acute distress. HEENT: [place] AT, moist mucus membranes.  Trachea midline, no masses. Cardiovascular: No clubbing, cyanosis, or edema. Respiratory: Normal respiratory effort, no increased work of breathing. GI: Abdomen is soft, nontender, nondistended, no abdominal masses GU: No CVA tenderness.  Lymph: No cervical or inguinal lymphadenopathy. Skin: No rashes, bruises or suspicious lesions. Neurologic: Grossly intact, no focal deficits, moving all 4 extremities. Psychiatric: Normal mood and affect.  Laboratory Data: Lab  Results  Component Value Date   WBC 8.7 03/16/2022   HGB 13.3 03/16/2022   HCT 39.0 03/16/2022   MCV 86.4 03/16/2022   PLT 207 03/16/2022    Lab Results  Component Value Date   CREATININE 0.71 11/21/2022    No results found for: "PSA"  No results found for: "TESTOSTERONE"  Lab Results  Component Value Date   HGBA1C 9.4 (A) 06/15/2023    Urinalysis    Component Value Date/Time   APPEARANCEUR Clear 02/16/2023 1519   GLUCOSEU 3+ (A) 02/16/2023 1519   BILIRUBINUR Negative 02/16/2023 1519  PROTEINUR Negative 02/16/2023 1519   NITRITE Negative 02/16/2023 1519   LEUKOCYTESUR Negative 02/16/2023 1519    Lab Results  Component Value Date   LABMICR Comment 02/16/2023    Pertinent Imaging:  No results found for this or any previous visit.  No results found for this or any previous visit.  No results found for this or any previous visit.  No results found for this or any previous visit.  No results found for this or any previous visit.  No results found for this or any previous visit.  No results found for this or any previous visit.  No results found for this or any previous visit.   Assessment & Plan:    1. Urinary frequency (Primary) -continue gemtesa 75mg  daily - Urinalysis, Routine w reflex microscopic   No follow-ups on file.  Wilkie Aye, MD  [facility] [place]

## 2023-08-20 LAB — URINALYSIS, ROUTINE W REFLEX MICROSCOPIC
Bilirubin, UA: NEGATIVE
Leukocytes,UA: NEGATIVE
Nitrite, UA: NEGATIVE
Protein,UA: NEGATIVE
RBC, UA: NEGATIVE
Specific Gravity, UA: 1.01 (ref 1.005–1.030)
Urobilinogen, Ur: 0.2 mg/dL (ref 0.2–1.0)
pH, UA: 6 (ref 5.0–7.5)

## 2023-08-25 ENCOUNTER — Encounter: Payer: Self-pay | Admitting: Urology

## 2023-08-25 NOTE — Patient Instructions (Signed)

## 2023-09-14 ENCOUNTER — Encounter: Payer: Self-pay | Admitting: "Endocrinology

## 2023-09-14 ENCOUNTER — Ambulatory Visit (INDEPENDENT_AMBULATORY_CARE_PROVIDER_SITE_OTHER): Payer: Medicare HMO | Admitting: "Endocrinology

## 2023-09-14 VITALS — BP 132/66 | HR 96 | Ht 65.0 in | Wt 162.0 lb

## 2023-09-14 DIAGNOSIS — E782 Mixed hyperlipidemia: Secondary | ICD-10-CM

## 2023-09-14 DIAGNOSIS — Z7984 Long term (current) use of oral hypoglycemic drugs: Secondary | ICD-10-CM

## 2023-09-14 DIAGNOSIS — I10 Essential (primary) hypertension: Secondary | ICD-10-CM

## 2023-09-14 DIAGNOSIS — E119 Type 2 diabetes mellitus without complications: Secondary | ICD-10-CM | POA: Diagnosis not present

## 2023-09-14 LAB — POCT GLYCOSYLATED HEMOGLOBIN (HGB A1C): HbA1c, POC (controlled diabetic range): 10 % — AB (ref 0.0–7.0)

## 2023-09-14 MED ORDER — GLIPIZIDE ER 5 MG PO TB24: 5.0000 mg | ORAL_TABLET | Freq: Every day | ORAL | 1 refills | Status: DC

## 2023-09-14 MED ORDER — METFORMIN HCL 1000 MG PO TABS
1000.0000 mg | ORAL_TABLET | Freq: Two times a day (BID) | ORAL | 1 refills | Status: DC
Start: 2023-09-14 — End: 2024-03-24

## 2023-09-14 MED ORDER — RYBELSUS 3 MG PO TABS: 3.0000 mg | ORAL_TABLET | Freq: Every day | ORAL | 0 refills | Status: DC

## 2023-09-14 NOTE — Patient Instructions (Signed)

## 2023-09-14 NOTE — Progress Notes (Signed)
 09/14/2023, 6:20 PM  Endocrinology follow-up note   Subjective:    Patient ID: Erika Ramos, female    DOB: 20-Nov-1945.  Erika Ramos is being seen in follow-up after she was seen in consultation for management of currently uncontrolled symptomatic diabetes requested by  Assunta Found, MD.   Past Medical History:  Diagnosis Date   Breast cancer [facility])    Breast cancer, stage 2, right (HCC) 09/06/2009   Qualifier: Diagnosis of  By: Denyse Amass, CMA, Carol     Coronary artery disease    Diabetes mellitus without complication (HCC)    Fatty liver disease, nonalcoholic    GERD (gastroesophageal reflux disease)    Hypertension    PONV (postoperative nausea and vomiting)    Nausea/ Vomitting- last time 2010   Stented coronary artery     Past Surgical History:  Procedure Laterality Date   ABDOMINAL HYSTERECTOMY     BREAST SURGERY  March 2010   cataract  Right 09/2012   CATARACT EXTRACTION  01/01/2018   left eye   COLONOSCOPY WITH PROPOFOL N/A 07/05/2021   Procedure: COLONOSCOPY WITH PROPOFOL;  Surgeon: Lanelle Bal, DO;  Location: [facility];  Service: Endoscopy;  Laterality: N/A;  8:45am   FRACTURE SURGERY     wrist  right   LEFT HEART CATH AND CORONARY ANGIOGRAPHY N/A 09/10/2021   Procedure: LEFT HEART CATH AND CORONARY ANGIOGRAPHY;  Surgeon: Lyn Records, MD;  Location: [facility];  Service: Cardiovascular;  Laterality: N/A;   PARS PLANA VITRECTOMY  07/03/2011   Procedure: PARS PLANA VITRECTOMY WITH 25 GAUGE;  Surgeon: Sherrie George, MD;  Location: [facility] OR;  Service: Ophthalmology;  Laterality: Right;  MEMBRANE PEEL, HEAD SCOPE LASER, GAS   POLYPECTOMY  07/05/2021   Procedure: POLYPECTOMY;  Surgeon: Lanelle Bal, DO;  Location: [facility];  Service: Endoscopy;;   RETINAL DETACHMENT SURGERY     RETINAL DETACHMENT SURGERY  07/03/11   right   TONSILLECTOMY       Social History   Socioeconomic History   Marital status: Widowed    Spouse name: Not on file   Number of children: Not on file   Years of education: Not on file   Highest education level: Not on file  Occupational History   Not on file  Tobacco Use   Smoking status: Never   Smokeless tobacco: Never  Vaping Use   Vaping status: Never Used  Substance and Sexual Activity   Alcohol use: No   Drug use: No   Sexual activity: Not on file    Comment: single-2 grown children  Other Topics Concern   Not on file  Social History Narrative   Not on file   Social Drivers of Health   Financial Resource Strain: Not on file  Food Insecurity: Not on file  Transportation Needs: Not on file  Physical Activity: Not on file  Stress: Not on file  Social Connections: Not on file    Family History  Problem Relation Age of Onset   Heart disease Mother    Ovarian cancer Mother  Stroke Father    Diabetes Sister    Diabetes Brother    Diabetes Sister    Heart disease Brother    Cancer Brother        spine   Anesthesia problems Neg Hx     Outpatient Encounter Medications as of 09/14/2023  Medication Sig   acetaminophen (TYLENOL) 650 MG CR tablet Take 650 mg by mouth every 8 (eight) hours as needed for pain.   glipiZIDE (GLUCOTROL XL) 5 MG 24 hr tablet Take 1 tablet (5 mg total) by mouth daily with breakfast.   metFORMIN (GLUCOPHAGE) 1000 MG tablet Take 1 tablet (1,000 mg total) by mouth 2 (two) times daily with a meal.   Semaglutide (RYBELSUS) 3 MG TABS Take 1 tablet (3 mg total) by mouth daily.   Accu-Chek Softclix Lancets lancets USE TO CHECK BLOOD GLUCOSE TWO TIMES DAILY AS DIRECTED   aspirin EC 81 MG tablet Take 81 mg by mouth daily.   Biotin 5000 MCG CHEW Chew by mouth daily. Per patient taking 2 chews daily   Blood Glucose Monitoring Suppl (ACCU-CHEK GUIDE ME) w/Device KIT Use to check blood glucose four times daily   Calcium-Vitamin D-Vitamin K 500-500-40 MG-UNT-MCG CHEW  Chew 1 each by mouth daily.   carvedilol (COREG) 12.5 MG tablet Take 1 tablet (12.5 mg total) by mouth 2 (two) times daily with a meal.   celecoxib (CELEBREX) 200 MG capsule TAKE 1 CAPSULE BY MOUTH TWICE DAILY BETWEEN MEALS AS NEEDED   Cholecalciferol (VITAMIN D3 GUMMIES ADULT PO) Take 2,000 Units by mouth daily.   Cyanocobalamin (VITAMIN B-12 PO) Take 1 tablet by mouth daily.   dapagliflozin propanediol (FARXIGA) 10 MG TABS tablet Take 1 tablet (10 mg total) by mouth daily with breakfast.   escitalopram (LEXAPRO) 10 MG tablet Take 10 mg by mouth daily.   fenofibrate (TRICOR) 145 MG tablet TAKE 1 TABLET(145 MG) BY MOUTH DAILY   glucose blood (ACCU-CHEK GUIDE TEST) test strip 1 each by Other route in the morning and at bedtime. Use as instructed   MAGNESIUM CITRATE PO Take 1 tablet by mouth daily.   Multiple Vitamins-Minerals (CENTRUM SILVER ULTRA WOMENS PO) Take 2 tablets by mouth daily.   nitroGLYCERIN (NITROSTAT) 0.4 MG SL tablet Place 1 tablet (0.4 mg total) under the tongue every 5 (five) minutes as needed for chest pain.   Omega-3 Fatty Acids (FISH OIL PO) Take 1 capsule by mouth daily at 6 (six) AM. Per patient taking daily not taking at 6:00 am   omeprazole (PRILOSEC) 20 MG capsule Take 20 mg by mouth daily.   ramipril (ALTACE) 10 MG capsule Take 10 mg by mouth daily.   rosuvastatin (CRESTOR) 40 MG tablet TAKE 1 TABLET(40 MG) BY MOUTH DAILY   Vibegron (GEMTESA) 75 MG TABS Take 1 tablet (75 mg total) by mouth daily.   vitamin C (ASCORBIC ACID) 250 MG tablet Take 500 mg by mouth daily.   zinc gluconate 50 MG tablet Take 50 mg by mouth daily.   [DISCONTINUED] JANUMET 50-1000 MG tablet Take 1 tablet by mouth 2 (two) times daily with a meal.   No facility-administered encounter medications on file as of 09/14/2023.    ALLERGIES: Allergies  Allergen Reactions   Clopidogrel Bisulfate Hives   Oxycontin [Oxycodone Hcl] Nausea And Vomiting    VACCINATION STATUS: Immunization History   Administered Date(s) Administered   Influenza,inj,Quad PF,6+ Mos 04/03/2016   PFIZER(Purple Top)SARS-COV-2 Vaccination 07/31/2019, 08/24/2019    Diabetes She presents for her follow-up diabetic visit.  She has type 2 diabetes mellitus. Onset time: She was diagnosed at approximate age of 20 years. Her disease course has been worsening. There are no hypoglycemic associated symptoms. Pertinent negatives for hypoglycemia include no confusion, headaches, pallor or seizures. Associated symptoms include fatigue and polyuria. Pertinent negatives for diabetes include no chest pain, no polydipsia and no polyphagia. There are no hypoglycemic complications. Symptoms are worsening. There are no diabetic complications. Risk factors for coronary artery disease include diabetes mellitus, dyslipidemia, hypertension, family history, post-menopausal and sedentary lifestyle. Current diabetic treatments: She is currently on Farxiga 10 mg p.o. daily at breakfast, Janumet 50/1000 mg twice daily. Her weight is fluctuating minimally. She is following a generally unhealthy diet. When asked about meal planning, she reported none. She has not had a previous visit with a dietitian. She rarely participates in exercise. Her home blood glucose trend is increasing steadily. Her breakfast blood glucose range is generally >200 mg/dl. Her dinner blood glucose range is generally >200 mg/dl. Her bedtime blood glucose range is generally >200 mg/dl. Her overall blood glucose range is >200 mg/dl. (She did bring her meter and logs averaging blood glucose of 211 for the last 30 days.  Her point-of-care A1c is 10% progressively increasing.  She denies or did not document  hypoglycemia.    ) An ACE inhibitor/angiotensin II receptor blocker is being taken.  Hypertension This is a chronic problem. The current episode started more than 1 year ago. The problem is controlled. Pertinent negatives include no chest pain, headaches, palpitations or  shortness of breath. Risk factors for coronary artery disease include diabetes mellitus, sedentary lifestyle, post-menopausal state, dyslipidemia and family history. Past treatments include ACE inhibitors. The current treatment provides moderate improvement.  Hyperlipidemia This is a chronic problem. The current episode started more than 1 year ago. Exacerbating diseases include diabetes. Pertinent negatives include no chest pain, myalgias or shortness of breath. Current antihyperlipidemic treatment includes statins. Risk factors for coronary artery disease include diabetes mellitus, dyslipidemia, hypertension, a sedentary lifestyle and post-menopausal.     Review of Systems  Constitutional:  Positive for fatigue. Negative for chills, fever and unexpected weight change.  HENT:  Negative for trouble swallowing and voice change.   Eyes:  Negative for visual disturbance.  Respiratory:  Negative for cough, shortness of breath and wheezing.   Cardiovascular:  Negative for chest pain, palpitations and leg swelling.  Gastrointestinal:  Negative for diarrhea, nausea and vomiting.  Endocrine: Positive for polyuria. Negative for cold intolerance, heat intolerance, polydipsia and polyphagia.  Musculoskeletal:  Negative for arthralgias and myalgias.  Skin:  Negative for color change, pallor, rash and wound.  Neurological:  Negative for seizures and headaches.  Psychiatric/Behavioral:  Negative for confusion and suicidal ideas.     Objective:       09/14/2023    2:10 PM 08/19/2023    3:13 PM 06/15/2023    1:18 PM  Vitals with BMI  Height 5\' 5"   5\' 5"   Weight 162 lbs  165 lbs 3 oz  BMI 26.96  27.49  Systolic 132 139 161  Diastolic 66 70 66  Pulse 96 88 68    BP 132/66   Pulse 96   Ht 5\' 5"  (1.651 m)   Wt 162 lb (73.5 kg)   BMI 26.96 kg/m   Wt Readings from Last 3 Encounters:  09/14/23 162 lb (73.5 kg)  06/15/23 165 lb 3.2 oz (74.9 kg)  11/24/22 167 lb (75.8 kg)      CMP ( most  recent) CMP     Component Value Date/Time   NA 138 11/21/2022 1124   K 4.6 11/21/2022 1124   CL 99 11/21/2022 1124   CO2 23 11/21/2022 1124   GLUCOSE 186 (H) 11/21/2022 1124   GLUCOSE 286 (H) 03/16/2022 2002   BUN 14 11/21/2022 1124   CREATININE 0.71 11/21/2022 1124   CALCIUM 10.2 11/21/2022 1124   PROT 6.7 11/21/2022 1124   ALBUMIN 4.2 11/21/2022 1124   AST 29 11/21/2022 1124   ALT 23 11/21/2022 1124   ALKPHOS 59 11/21/2022 1124   BILITOT 0.3 11/21/2022 1124   GFRNONAA >60 03/16/2022 1948   GFRAA 52 (L) 07/17/2017 2118     Diabetic Labs (most recent): Lab Results  Component Value Date   HGBA1C 10.0 (A) 09/14/2023   HGBA1C 9.4 (A) 06/15/2023   HGBA1C 9 11/24/2022     Lipid Panel ( most recent) Lipid Panel     Component Value Date/Time   CHOL 144 11/21/2022 1124   TRIG 113 11/21/2022 1124   HDL 44 11/21/2022 1124   CHOLHDL 3.3 11/21/2022 1124   CHOLHDL 3.4 12/30/2021 1445   VLDL 49 (H) 12/30/2021 1445   LDLCALC 79 11/21/2022 1124   LABVLDL 21 11/21/2022 1124      Assessment & Plan:   1. Type 2 diabetes mellitus without complication, without long-term current use of insulin (HCC)   - Erika Ramos has currently uncontrolled symptomatic type 2 DM since  78 years of age.  She did bring her meter and logs averaging blood glucose of 211 for the last 30 days.  Her point-of-care A1c is 10% progressively increasing.  She denies or did not document  hypoglycemia.     - I had a long discussion with her about the possible risk factors and  the pathology behind its diabetes and its complications. -her diabetes is complicated by sedentary life, dietary indiscretions and she remains at a high risk for more acute and chronic complications which include CAD, CVA, CKD, retinopathy, and neuropathy. These are all discussed in detail with her.  - I discussed all available options of managing her diabetes including de-escalation of medications. I have counseled her on Food  as Medicine by adopting a Whole Food , Plant Predominant  ( WFPP) nutrition as recommended by [employer] of Lifestyle Medicine. Patient is encouraged to switch to  unprocessed or minimally processed  complex starch, adequate protein intake (mainly plant source), minimal liquid fat, plenty of fruits, and vegetables. -  she is advised to stick to a routine mealtimes to eat 3 complete meals a day and snack only when necessary ( to snack only to correct hypoglycemia BG <70 day time or <100 at night).  Patient made significant change in her diet and benefiting from lifestyle medicine. In light of her desire to avoid injectable treatment for diabetes she remains a good candidate for lifestyle medicine.  - she did not engage optimally with lifestyle medicine, however she would like to try again.  She acknowledges that there is a room for improvement in her food and drink choices. - Suggestion is made for her to avoid simple carbohydrates  from her diet including Cakes, Sweet Desserts, Ice Cream, Soda (diet and regular), Sweet Tea, Candies, Chips, Cookies, Store Bought Juices, Alcohol , Artificial Sweeteners,  Coffee Creamer, and "Sugar-free" Products, Lemonade. This will help patient to have more stable blood glucose profile and potentially avoid unintended weight gain.  The following Lifestyle Medicine recommendations according to [facility]  of Lifestyle Medicine  [facility]) were discussed and and offered to patient and she  agrees to start the journey:  A. Whole Foods, Plant-Based Nutrition comprising of fruits and vegetables, plant-based proteins, whole-grain carbohydrates was discussed in detail with the patient.   A list for source of those nutrients were also provided to the patient.  Patient will use only water or unsweetened tea for hydration. B.  The need to stay away from risky substances including alcohol, smoking; obtaining 7 to 9 hours of restorative sleep, at least 150 minutes of moderate  intensity exercise weekly, the importance of healthy social connections,  and stress management techniques were discussed. C.  A full color page of  Calorie density of various food groups per pound showing examples of each food groups was provided to the patient.  - she will be scheduled with Norm Salt, RDN, CDE for individualized diabetes education.  - I have approached her with the following individualized plan to manage  her diabetes and patient agrees:   -In light of her presentation with significant hyperglycemic burden, she was approached with interventions with insulin or weekly GLP-1 receptor agonists.  Patient would like to avoid any injectable treatment at this time.   -She is open for oral medication.  I discussed initiated Rybelsus 3 mg p.o. daily before breakfast, to advance as tolerated. -This will make it necessary for [place] to discontinue Janumet to avoid duplicate exposure.  I discussed and started metformin 1000 mg p.o. twice daily.  I also discussed and added glipizide 5 mg XL p.o. daily at breakfast and continued Farxiga 10 mg p.o. daily at breakfast.   -She will eventually need insulin treatment in order for her to achieve control of diabetes to target unless she optimally engages with lifestyle medicine.     She is willing to monitor blood glucose twice a day-daily before breakfast and at bedtime.    - she is encouraged to call clinic for blood glucose levels less than 70 or above 200 mg /dl x 3 days in a week.  - Specific targets for  A1c;  LDL, HDL,  and Triglycerides were discussed with the patient.  2) Blood Pressure /Hypertension:   Her blood pressure is controlled to target.  she is advised to continue her current medications including ramipril 10 mg p.o. daily with breakfast .   3) Lipids/Hyperlipidemia:   Her lipid panel is controlled will LDL of 79.  she  is advised to continue Crestor 40 mg p.o. daily at bedtime.   Side effects and precautions discussed with  her.  4)  Weight/Diet:  Body mass index is 26.96 kg/m.  -   she is  a candidate for some weight loss. I discussed with her the fact that loss of 5 - 10% of her  current body weight will have the most impact on her diabetes management.  The above detailed  [facility] recommendations for nutrition, exercise, sleep, social life, avoidance of risky substances, the need for restorative sleep   information will also detailed on discharge instructions.  5) Chronic Care/Health Maintenance:  -she  is on ACEI/ARB and Statin medications and  is encouraged to initiate and continue to follow up with Ophthalmology, Dentist,  Podiatrist at least yearly or according to recommendations, and advised to   stay away from smoking. I have recommended yearly flu vaccine and pneumonia vaccine at least every 5 years; moderate intensity exercise for up to 150 minutes weekly; and  sleep for 7- 9 hours  a day.  - she is  advised to maintain close follow up with Assunta Found, MD for primary care needs, as well as her other providers for optimal and coordinated care.   I spent  2  minutes in the care of the patient today including review of labs from CMP, Lipids, Thyroid Function, Hematology (current and previous including abstractions from other facilities); face-to-face time discussing  her blood glucose readings/logs, discussing hypoglycemia and hyperglycemia episodes and symptoms, medications doses, her options of short and long term treatment based on the latest standards of care / guidelines;  discussion about incorporating lifestyle medicine;  and documenting the encounter. Risk reduction counseling performed per USPSTF guidelines to reduce cardiovascular risk factors.     Please refer to Patient Instructions for Blood Glucose Monitoring and Insulin/Medications Dosing Guide"  in media tab for additional information. Please  also refer to " Patient Self Inventory" in the Media  tab for reviewed elements of pertinent patient  history.  Erika Ramos participated in the discussions, expressed understanding, and voiced agreement with the above plans.  All questions were answered to her satisfaction. she is encouraged to contact clinic should she have any questions or concerns prior to her return visit.   Follow up plan: - Return in about 3 months (around 12/15/2023) for F/U with Pre-visit Labs, Meter/CGM/Logs, A1c here.  Marquis Lunch, MD [facility] [address] [place], [place] [address] Phone: [phone]  Fax: [phone]    09/14/2023, 6:20 PM  This note was partially dictated with voice recognition software. Similar sounding words can be transcribed inadequately or may not  be corrected upon review.

## 2023-09-16 ENCOUNTER — Other Ambulatory Visit (HOSPITAL_COMMUNITY): Payer: Self-pay

## 2023-09-18 ENCOUNTER — Other Ambulatory Visit: Payer: Self-pay | Admitting: Orthopaedic Surgery

## 2023-09-28 DIAGNOSIS — Z6827 Body mass index (BMI) 27.0-27.9, adult: Secondary | ICD-10-CM | POA: Diagnosis not present

## 2023-09-28 DIAGNOSIS — E663 Overweight: Secondary | ICD-10-CM | POA: Diagnosis not present

## 2023-09-28 DIAGNOSIS — M6208 Separation of muscle (nontraumatic), other site: Secondary | ICD-10-CM | POA: Diagnosis not present

## 2023-10-16 ENCOUNTER — Other Ambulatory Visit: Payer: Self-pay | Admitting: Orthopaedic Surgery

## 2023-10-27 ENCOUNTER — Other Ambulatory Visit: Payer: Self-pay | Admitting: Nurse Practitioner

## 2023-11-05 ENCOUNTER — Other Ambulatory Visit (HOSPITAL_COMMUNITY): Payer: Self-pay | Admitting: Family Medicine

## 2023-11-05 DIAGNOSIS — R921 Mammographic calcification found on diagnostic imaging of breast: Secondary | ICD-10-CM

## 2023-12-10 DIAGNOSIS — E119 Type 2 diabetes mellitus without complications: Secondary | ICD-10-CM | POA: Diagnosis not present

## 2023-12-11 LAB — COMPREHENSIVE METABOLIC PANEL WITH GFR
ALT: 23 IU/L (ref 0–32)
AST: 25 IU/L (ref 0–40)
Albumin: 4.3 g/dL (ref 3.8–4.8)
Alkaline Phosphatase: 66 IU/L (ref 44–121)
BUN/Creatinine Ratio: 25 (ref 12–28)
BUN: 19 mg/dL (ref 8–27)
Bilirubin Total: 0.3 mg/dL (ref 0.0–1.2)
CO2: 21 mmol/L (ref 20–29)
Calcium: 10.1 mg/dL (ref 8.7–10.3)
Chloride: 101 mmol/L (ref 96–106)
Creatinine, Ser: 0.76 mg/dL (ref 0.57–1.00)
Globulin, Total: 2.3 g/dL (ref 1.5–4.5)
Glucose: 232 mg/dL — ABNORMAL HIGH (ref 70–99)
Potassium: 5 mmol/L (ref 3.5–5.2)
Sodium: 139 mmol/L (ref 134–144)
Total Protein: 6.6 g/dL (ref 6.0–8.5)
eGFR: 81 mL/min/{1.73_m2} (ref >59)

## 2023-12-11 LAB — LIPID PANEL
Chol/HDL Ratio: 3.3 ratio (ref 0.0–4.4)
Cholesterol, Total: 147 mg/dL (ref 100–199)
HDL: 45 mg/dL (ref >39)
LDL Chol Calc (NIH): 81 mg/dL (ref 0–99)
Triglycerides: 117 mg/dL (ref 0–149)
VLDL Cholesterol Cal: 21 mg/dL (ref 5–40)

## 2023-12-15 ENCOUNTER — Encounter: Payer: Self-pay | Admitting: "Endocrinology

## 2023-12-15 ENCOUNTER — Ambulatory Visit (HOSPITAL_COMMUNITY)

## 2023-12-15 ENCOUNTER — Encounter (HOSPITAL_COMMUNITY)

## 2023-12-15 ENCOUNTER — Ambulatory Visit (INDEPENDENT_AMBULATORY_CARE_PROVIDER_SITE_OTHER): Admitting: "Endocrinology

## 2023-12-15 VITALS — BP 134/76 | HR 84 | Ht 65.0 in | Wt 162.2 lb

## 2023-12-15 DIAGNOSIS — I10 Essential (primary) hypertension: Secondary | ICD-10-CM | POA: Diagnosis not present

## 2023-12-15 DIAGNOSIS — Z7984 Long term (current) use of oral hypoglycemic drugs: Secondary | ICD-10-CM

## 2023-12-15 DIAGNOSIS — E119 Type 2 diabetes mellitus without complications: Secondary | ICD-10-CM | POA: Diagnosis not present

## 2023-12-15 DIAGNOSIS — E782 Mixed hyperlipidemia: Secondary | ICD-10-CM | POA: Diagnosis not present

## 2023-12-15 LAB — POCT GLYCOSYLATED HEMOGLOBIN (HGB A1C): HbA1c, POC (controlled diabetic range): 10.7 % — AB (ref 0.0–7.0)

## 2023-12-15 MED ORDER — ACCU-CHEK GUIDE TEST VI STRP: 1.0000 | ORAL_STRIP | Freq: Two times a day (BID) | 1 refills | Status: AC

## 2023-12-15 MED ORDER — TRESIBA FLEXTOUCH 100 UNIT/ML ~~LOC~~ SOPN: 20.0000 [IU] | PEN_INJECTOR | Freq: Every day | SUBCUTANEOUS | 1 refills | Status: DC

## 2023-12-15 MED ORDER — INSULIN PEN NEEDLE 29G X 5MM MISC: 1 refills | Status: DC

## 2023-12-15 NOTE — Patient Instructions (Signed)

## 2023-12-15 NOTE — Progress Notes (Signed)
 12/15/2023, 6:43 PM  Endocrinology follow-up note   Subjective:    Patient ID: Erika Ramos, female    DOB: 1946-05-31.  Erika Ramos is being seen in follow-up after she was seen in consultation for management of currently uncontrolled symptomatic diabetes requested by  Marvine Rush, MD.   Past Medical History:  Diagnosis Date   Breast cancer [facility])    Breast cancer, stage 2, right (HCC) 09/06/2009   Qualifier: Diagnosis of  By: Wynetta, CMA, Carol     Coronary artery disease    Diabetes mellitus without complication (HCC)    Fatty liver disease, nonalcoholic    GERD (gastroesophageal reflux disease)    Hypertension    PONV (postoperative nausea and vomiting)    Nausea/ Vomitting- last time 2010   Stented coronary artery     Past Surgical History:  Procedure Laterality Date   ABDOMINAL HYSTERECTOMY     BREAST SURGERY  March 2010   cataract  Right 09/2012   CATARACT EXTRACTION  01/01/2018   left eye   COLONOSCOPY WITH PROPOFOL  N/A 07/05/2021   Procedure: COLONOSCOPY WITH PROPOFOL ;  Surgeon: Cindie Carlin POUR, DO;  Location: [facility];  Service: Endoscopy;  Laterality: N/A;  8:45am   FRACTURE SURGERY     wrist  right   LEFT HEART CATH AND CORONARY ANGIOGRAPHY N/A 09/10/2021   Procedure: LEFT HEART CATH AND CORONARY ANGIOGRAPHY;  Surgeon: Claudene Victory ORN, MD;  Location: [facility];  Service: Cardiovascular;  Laterality: N/A;   PARS PLANA VITRECTOMY  07/03/2011   Procedure: PARS PLANA VITRECTOMY WITH 25 GAUGE;  Surgeon: Rush JONETTA Ku, MD;  Location: [facility] OR;  Service: Ophthalmology;  Laterality: Right;  MEMBRANE PEEL, HEAD SCOPE LASER, GAS   POLYPECTOMY  07/05/2021   Procedure: POLYPECTOMY;  Surgeon: Cindie Carlin POUR, DO;  Location: [facility];  Service: Endoscopy;;   RETINAL DETACHMENT SURGERY     RETINAL DETACHMENT SURGERY  07/03/11   right   TONSILLECTOMY       Social History   Socioeconomic History   Marital status: Widowed    Spouse name: Not on file   Number of children: Not on file   Years of education: Not on file   Highest education level: Not on file  Occupational History   Not on file  Tobacco Use   Smoking status: Never   Smokeless tobacco: Never  Vaping Use   Vaping status: Never Used  Substance and Sexual Activity   Alcohol use: No   Drug use: No   Sexual activity: Not on file    Comment: single-2 grown children  Other Topics Concern   Not on file  Social History Narrative   Not on file   Social Drivers of Health   Financial Resource Strain: Not on file  Food Insecurity: Not on file  Transportation Needs: Not on file  Physical Activity: Not on file  Stress: Not on file  Social Connections: Not on file    Family History  Problem Relation Age of Onset   Heart disease Mother    Ovarian cancer Mother  Stroke Father    Diabetes Sister    Diabetes Brother    Diabetes Sister    Heart disease Brother    Cancer Brother        spine   Anesthesia problems Neg Hx     Outpatient Encounter Medications as of 12/15/2023  Medication Sig   insulin  degludec (TRESIBA FLEXTOUCH) 100 UNIT/ML FlexTouch Pen Inject 20 Units into the skin at bedtime.   Insulin  Pen Needle 29G X MISC Use to inject insulin  daily   Accu-Chek Softclix Lancets lancets USE TO CHECK BLOOD GLUCOSE TWO TIMES DAILY AS DIRECTED   acetaminophen  (TYLENOL ) 650 MG CR tablet Take 650 mg by mouth every 8 (eight) hours as needed for pain.   aspirin  EC 81 MG tablet Take 81 mg by mouth daily.   Blood Glucose Monitoring Suppl (ACCU-CHEK GUIDE ME) w/Device KIT Use to check blood glucose four times daily   Calcium -Vitamin D -Vitamin K 500-500-40 MG-UNT-MCG CHEW Chew 1 each by mouth daily.   carvedilol  (COREG ) 12.5 MG tablet Take 1 tablet (12.5 mg total) by mouth 2 (two) times daily with a meal.   celecoxib  (CELEBREX ) 200 MG capsule TAKE 1 CAPSULE BY MOUTH  TWICE DAILY AS NEEDED BETWEEN MEALS   Cholecalciferol (VITAMIN D3 GUMMIES ADULT PO) Take 2,000 Units by mouth daily.   Cyanocobalamin  (VITAMIN B-12 PO) Take 1 tablet by mouth daily.   dapagliflozin  propanediol (FARXIGA ) 10 MG TABS tablet Take 1 tablet (10 mg total) by mouth daily with breakfast.   escitalopram (LEXAPRO) 10 MG tablet Take 10 mg by mouth daily.   fenofibrate  (TRICOR ) 145 MG tablet TAKE 1 TABLET(145 MG) BY MOUTH DAILY   glipiZIDE  (GLUCOTROL  XL) 5 MG 24 hr tablet Take 1 tablet (5 mg total) by mouth daily with breakfast.   glucose blood (ACCU-CHEK GUIDE TEST) test strip 1 each by Other route in the morning and at bedtime. Use to monitor glucose 4 times a day  as instructed   MAGNESIUM  CITRATE PO Take 1 tablet by mouth daily.   metFORMIN  (GLUCOPHAGE ) 1000 MG tablet Take 1 tablet (1,000 mg total) by mouth 2 (two) times daily with a meal.   Multiple Vitamins-Minerals (CENTRUM SILVER ULTRA WOMENS PO) Take 2 tablets by mouth daily.   nitroGLYCERIN  (NITROSTAT ) 0.4 MG SL tablet Place 1 tablet (0.4 mg total) under the tongue every 5 (five) minutes as needed for chest pain.   Omega-3 Fatty Acids (FISH OIL PO) Take 1 capsule by mouth daily at 6 (six) AM. Per patient taking daily not taking at 6:00 am   omeprazole (PRILOSEC) 20 MG capsule Take 20 mg by mouth daily.   ramipril (ALTACE) 10 MG capsule Take 10 mg by mouth daily.   rosuvastatin  (CRESTOR ) 40 MG tablet Take 1 tablet by mouth once daily   Vibegron  (GEMTESA ) 75 MG TABS Take 1 tablet (75 mg total) by mouth daily.   vitamin C (ASCORBIC ACID) 250 MG tablet Take 500 mg by mouth daily.   zinc gluconate 50 MG tablet Take 50 mg by mouth daily.   [DISCONTINUED] Biotin 5000 MCG CHEW Chew by mouth daily. Per patient taking 2 chews daily   [DISCONTINUED] glucose blood (ACCU-CHEK GUIDE TEST) test strip 1 each by Other route in the morning and at bedtime. Use as instructed   [DISCONTINUED] Semaglutide  (RYBELSUS ) 3 MG TABS Take 1 tablet (3 mg total)  by mouth daily. (Patient not taking: Reported on 12/15/2023)   No facility-administered encounter medications on file as of 12/15/2023.  ALLERGIES: Allergies  Allergen Reactions   Clopidogrel Bisulfate Hives   Oxycontin  [Oxycodone  Hcl] Nausea And Vomiting    VACCINATION STATUS: Immunization History  Administered Date(s) Administered   Influenza,inj,Quad PF,6+ Mos 04/03/2016   PFIZER(Purple Top)SARS-COV-2 Vaccination 07/31/2019, 08/24/2019    Diabetes She presents for her follow-up diabetic visit. She has type 2 diabetes mellitus. Onset time: She was diagnosed at approximate age of 53 years. Her disease course has been worsening. There are no hypoglycemic associated symptoms. Pertinent negatives for hypoglycemia include no confusion, headaches, pallor or seizures. Associated symptoms include fatigue and polyuria. Pertinent negatives for diabetes include no chest pain, no polydipsia and no polyphagia. There are no hypoglycemic complications. Symptoms are worsening. There are no diabetic complications. Risk factors for coronary artery disease include diabetes mellitus, dyslipidemia, hypertension, family history, post-menopausal and sedentary lifestyle. Current diabetic treatments: She is currently on Farxiga  10 mg p.o. daily at breakfast, Janumet  50/1000 mg twice daily. Her weight is fluctuating minimally. She is following a generally unhealthy diet. When asked about meal planning, she reported none. She has not had a previous visit with a dietitian. She rarely participates in exercise. Her home blood glucose trend is increasing steadily. Her breakfast blood glucose range is generally >200 mg/dl. Her bedtime blood glucose range is generally >200 mg/dl. Her overall blood glucose range is >200 mg/dl. (She did bring her meter and logs significantly above target glycemic profile with point-of-care A1c of 10.7%, worsening.  She did not fill the Rybelsus  prescription prescribed to her during her last  visit.   She denies or did not document  hypoglycemia.    ) An ACE inhibitor/angiotensin II receptor blocker is being taken.  Hypertension This is a chronic problem. The current episode started more than 1 year ago. The problem is controlled. Pertinent negatives include no chest pain, headaches, palpitations or shortness of breath. Risk factors for coronary artery disease include diabetes mellitus, sedentary lifestyle, post-menopausal state, dyslipidemia and family history. Past treatments include ACE inhibitors. The current treatment provides moderate improvement.  Hyperlipidemia This is a chronic problem. The current episode started more than 1 year ago. Exacerbating diseases include diabetes. Pertinent negatives include no chest pain, myalgias or shortness of breath. Current antihyperlipidemic treatment includes statins. Risk factors for coronary artery disease include diabetes mellitus, dyslipidemia, hypertension, a sedentary lifestyle and post-menopausal.       Objective:       12/15/2023    2:09 PM 09/14/2023    2:10 PM 08/19/2023    3:13 PM  Vitals with BMI  Height 5' 5 5' 5   Weight 162 lbs 3 oz 162 lbs   BMI 26.99 26.96   Systolic 134 132 860  Diastolic 76 66 70  Pulse 84 96 88    BP 134/76   Pulse 84   Ht 5' 5 (1.651 m)   Wt 162 lb 3.2 oz (73.6 kg)   BMI 26.99 kg/m   Wt Readings from Last 3 Encounters:  12/15/23 162 lb 3.2 oz (73.6 kg)  09/14/23 162 lb (73.5 kg)  06/15/23 165 lb 3.2 oz (74.9 kg)      CMP ( most recent) CMP     Component Value Date/Time   NA 139 12/10/2023 0916   K 5.0 12/10/2023 0916   CL 101 12/10/2023 0916   CO2 21 12/10/2023 0916   GLUCOSE 232 (H) 12/10/2023 0916   GLUCOSE 286 (H) 03/16/2022 2002   BUN 19 12/10/2023 0916   CREATININE 0.76 12/10/2023 0916   CALCIUM   10.1 12/10/2023 0916   PROT 6.6 12/10/2023 0916   ALBUMIN 4.3 12/10/2023 0916   AST 25 12/10/2023 0916   ALT 23 12/10/2023 0916   ALKPHOS 66 12/10/2023 0916   BILITOT  0.3 12/10/2023 0916   GFRNONAA >60 03/16/2022 1948   GFRAA 52 (L) 07/17/2017 2118     Diabetic Labs (most recent): Lab Results  Component Value Date   HGBA1C 10.7 (A) 12/15/2023   HGBA1C 10.0 (A) 09/14/2023   HGBA1C 9.4 (A) 06/15/2023     Lipid Panel ( most recent) Lipid Panel     Component Value Date/Time   CHOL 147 12/10/2023 0916   TRIG 117 12/10/2023 0916   HDL 45 12/10/2023 0916   CHOLHDL 3.3 12/10/2023 0916   CHOLHDL 3.4 12/30/2021 1445   VLDL 49 (H) 12/30/2021 1445   LDLCALC 81 12/10/2023 0916   LABVLDL 21 12/10/2023 0916      Assessment & Plan:   1. Type 2 diabetes mellitus without complication, without long-term current use of insulin  (HCC)   - Erika Ramos has currently uncontrolled symptomatic type 2 DM since  78 years of age.  She did bring her meter and logs significantly above target glycemic profile with point-of-care A1c of 10.7%, worsening.  She did not fill the Rybelsus  prescription prescribed to her during her last visit.   She denies or did not document  hypoglycemia.     - I had a long discussion with her about the possible risk factors and  the pathology behind its diabetes and its complications. -her diabetes is complicated by sedentary life, dietary indiscretions and she remains at exceedingly high risk for more acute and chronic complications which include CAD, CVA, CKD, retinopathy, and neuropathy. These are all discussed in detail with her.  - I discussed all available options of managing her diabetes including de-escalation of medications. I have counseled her on Food as Medicine by adopting a Whole Food , Plant Predominant  ( WFPP) nutrition as recommended by [employer] of Lifestyle Medicine. Patient is encouraged to switch to  unprocessed or minimally processed  complex starch, adequate protein intake (mainly plant source), minimal liquid fat, plenty of fruits, and vegetables. -  she is advised to stick to a routine mealtimes to  eat 3 complete meals a day and snack only when necessary ( to snack only to correct hypoglycemia BG <70 day time or <100 at night).  Patient made significant change in her diet and benefiting from lifestyle medicine. In light of her desire to avoid injectable treatment for diabetes she remains a good candidate for lifestyle medicine.   - she acknowledges that there is a room for improvement in her food and drink choices. - Suggestion is made for her to avoid simple carbohydrates  from her diet including Cakes, Sweet Desserts, Ice Cream, Soda (diet and regular), Sweet Tea, Candies, Chips, Cookies, Store Bought Juices, Alcohol , Artificial Sweeteners,  Coffee Creamer, and Sugar-free Products, Lemonade. This will help patient to have more stable blood glucose profile and potentially avoid unintended weight gain.  The following Lifestyle Medicine recommendations according to American College of Lifestyle Medicine  [facility]) were discussed and and offered to patient and she  agrees to start the journey:  A. Whole Foods, Plant-Based Nutrition comprising of fruits and vegetables, plant-based proteins, whole-grain carbohydrates was discussed in detail with the patient.   A list for source of those nutrients were also provided to the patient.  Patient will use only water or unsweetened  tea for hydration. B.  The need to stay away from risky substances including alcohol, smoking; obtaining 7 to 9 hours of restorative sleep, at least 150 minutes of moderate intensity exercise weekly, the importance of healthy social connections,  and stress management techniques were discussed. C.  A full color page of  Calorie density of various food groups per pound showing examples of each food groups was provided to the patient.   - she will be scheduled with Penny Crumpton, RDN, CDE for individualized diabetes education.  - I have approached her with the following individualized plan to manage  her diabetes and patient  agrees:   -In light of her presentation with significant hyperglycemic burden, she was approached for intervention with insulin  for 1 more time.  This time she is open.  This patient has been hesitant to go on insulin  for several visits now.  - I  demonstrated insulin  utilization in the exam room.  I discussed and prescribed Tresiba 20 units nightly associated with monitoring of blood glucose 4 times a day-before meals and at bedtime.   - She is advised to continue metformin  1000 mg p.o. twice daily, Farxiga  10 mg p.o. daily at breakfast, continue glipizide  5 mg XL p.o. daily at breakfast.  - she is encouraged to call clinic for blood glucose levels less than 70 or above 300 mg /dl x 3 days in a week.  - Specific targets for  A1c;  LDL, HDL,  and Triglycerides were discussed with the patient.  2) Blood Pressure /Hypertension:   -Her blood pressure is controlled to target.  she is advised to continue her current medications including ramipril 10 mg p.o. daily with breakfast .   3) Lipids/Hyperlipidemia:   Her lipid panel is controlled LDL at 81.  She is advised to continue Crestor  4 mg p.o. daily at bedtime.     Side effects and precautions discussed with her.  4)  Weight/Diet:  Body mass index is 26.99 kg/m.  -   she is  a candidate for some weight loss. I discussed with her the fact that loss of 5 - 10% of her  current body weight will have the most impact on her diabetes management.  The above detailed  ACLM recommendations for nutrition, exercise, sleep, social life, avoidance of risky substances, the need for restorative sleep   information will also detailed on discharge instructions.  5) Chronic Care/Health Maintenance:  -she  is on ACEI/ARB and Statin medications and  is encouraged to initiate and continue to follow up with Ophthalmology, Dentist,  Podiatrist at least yearly or according to recommendations, and advised to   stay away from smoking. I have recommended yearly flu vaccine  and pneumonia vaccine at least every 5 years; moderate intensity exercise for up to 150 minutes weekly; and  sleep for 7- 9 hours a day.  - she is  advised to maintain close follow up with Marvine Rush, MD for primary care needs, as well as her other providers for optimal and coordinated care.   I spent  42  minutes in the care of the patient today including review of labs from CMP, Lipids, Thyroid  Function, Hematology (current and previous including abstractions from other facilities); face-to-face time discussing  her blood glucose readings/logs, discussing hypoglycemia and hyperglycemia episodes and symptoms, medications doses, her options of short and long term treatment based on the latest standards of care / guidelines;  discussion about incorporating lifestyle medicine;  and documenting the encounter. Risk reduction  counseling performed per USPSTF guidelines to reduce  cardiovascular risk factors.     Please refer to Patient Instructions for Blood Glucose Monitoring and Insulin /Medications Dosing Guide  in media tab for additional information. Please  also refer to  Patient Self Inventory in the Media  tab for reviewed elements of pertinent patient history.  Erika Ramos participated in the discussions, expressed understanding, and voiced agreement with the above plans.  All questions were answered to her satisfaction. she is encouraged to contact clinic should she have any questions or concerns prior to her return visit.   Follow up plan: - Return in about 3 weeks (around 01/05/2024) for F/U with Meter/CGM Jonnie Only - no Labs.  Ranny Earl, MD [facility] [address] [place], [place] [address] Phone: [phone]  Fax: [phone]    12/15/2023, 6:43 PM  This note was partially dictated with voice recognition software. Similar sounding words can be transcribed inadequately or may not  be corrected upon review.

## 2023-12-19 ENCOUNTER — Other Ambulatory Visit: Payer: Self-pay | Admitting: Physician Assistant

## 2023-12-19 ENCOUNTER — Other Ambulatory Visit: Payer: Self-pay | Admitting: "Endocrinology

## 2023-12-21 ENCOUNTER — Other Ambulatory Visit (HOSPITAL_COMMUNITY): Payer: Self-pay | Admitting: Family Medicine

## 2023-12-21 ENCOUNTER — Ambulatory Visit (HOSPITAL_COMMUNITY)
Admission: RE | Admit: 2023-12-21 | Discharge: 2023-12-21 | Disposition: A | Discharge status: Home / Self Care | Source: Ambulatory Visit | Attending: Family Medicine | Admitting: Family Medicine

## 2023-12-21 ENCOUNTER — Encounter (INDEPENDENT_AMBULATORY_CARE_PROVIDER_SITE_OTHER): Payer: Self-pay

## 2023-12-21 DIAGNOSIS — M1612 Unilateral primary osteoarthritis, left hip: Secondary | ICD-10-CM

## 2023-12-24 ENCOUNTER — Ambulatory Visit (HOSPITAL_COMMUNITY)
Admission: RE | Admit: 2023-12-24 | Discharge: 2023-12-24 | Disposition: A | Discharge status: Home / Self Care | Source: Ambulatory Visit | Attending: Family Medicine | Admitting: Family Medicine

## 2023-12-24 DIAGNOSIS — R928 Other abnormal and inconclusive findings on diagnostic imaging of breast: Secondary | ICD-10-CM | POA: Diagnosis not present

## 2023-12-24 DIAGNOSIS — R921 Mammographic calcification found on diagnostic imaging of breast: Secondary | ICD-10-CM | POA: Insufficient documentation

## 2023-12-24 DIAGNOSIS — M25552 Pain in left hip: Secondary | ICD-10-CM | POA: Diagnosis not present

## 2023-12-24 DIAGNOSIS — R92322 Mammographic fibroglandular density, left breast: Secondary | ICD-10-CM | POA: Diagnosis not present

## 2023-12-24 DIAGNOSIS — M25562 Pain in left knee: Secondary | ICD-10-CM | POA: Diagnosis not present

## 2024-01-04 ENCOUNTER — Ambulatory Visit (INDEPENDENT_AMBULATORY_CARE_PROVIDER_SITE_OTHER): Admitting: "Endocrinology

## 2024-01-04 ENCOUNTER — Encounter: Payer: Self-pay | Admitting: "Endocrinology

## 2024-01-04 VITALS — BP 130/66 | HR 92 | Ht 65.0 in | Wt 160.2 lb

## 2024-01-04 DIAGNOSIS — E782 Mixed hyperlipidemia: Secondary | ICD-10-CM | POA: Diagnosis not present

## 2024-01-04 DIAGNOSIS — I10 Essential (primary) hypertension: Secondary | ICD-10-CM

## 2024-01-04 DIAGNOSIS — E119 Type 2 diabetes mellitus without complications: Secondary | ICD-10-CM

## 2024-01-04 DIAGNOSIS — Z794 Long term (current) use of insulin: Secondary | ICD-10-CM

## 2024-01-04 MED ORDER — TRESIBA FLEXTOUCH 100 UNIT/ML ~~LOC~~ SOPN: 26.0000 [IU] | PEN_INJECTOR | Freq: Every day | SUBCUTANEOUS | 1 refills | Status: DC

## 2024-01-04 MED ORDER — FREESTYLE LIBRE 3 PLUS SENSOR MISC: 2 refills | Status: AC

## 2024-01-04 MED ORDER — FREESTYLE LIBRE 3 READER DEVI: 1.0000 | Freq: Once | 0 refills | Status: AC | PRN

## 2024-01-04 NOTE — Progress Notes (Signed)
 01/04/2024, 5:56 PM  Endocrinology follow-up note   Subjective:    Patient ID: Erika Ramos, female    DOB: 06-29-1945.  Erika Ramos is being seen in follow-up after she was seen in consultation for management of currently uncontrolled symptomatic diabetes requested by  Marvine Rush, MD.   Past Medical History:  Diagnosis Date   Breast cancer [facility])    Breast cancer, stage 2, right (HCC) 09/06/2009   Qualifier: Diagnosis of  By: Wynetta, CMA, Carol     Coronary artery disease    Diabetes mellitus without complication (HCC)    Fatty liver disease, nonalcoholic    GERD (gastroesophageal reflux disease)    Hypertension    PONV (postoperative nausea and vomiting)    Nausea/ Vomitting- last time 2010   Stented coronary artery     Past Surgical History:  Procedure Laterality Date   ABDOMINAL HYSTERECTOMY     BREAST SURGERY  March 2010   cataract  Right 09/2012   CATARACT EXTRACTION  01/01/2018   left eye   COLONOSCOPY WITH PROPOFOL  N/A 07/05/2021   Procedure: COLONOSCOPY WITH PROPOFOL ;  Surgeon: Cindie Carlin POUR, DO;  Location: [facility];  Service: Endoscopy;  Laterality: N/A;  8:45am   FRACTURE SURGERY     wrist  right   LEFT HEART CATH AND CORONARY ANGIOGRAPHY N/A 09/10/2021   Procedure: LEFT HEART CATH AND CORONARY ANGIOGRAPHY;  Surgeon: Claudene Victory ORN, MD;  Location: [facility];  Service: Cardiovascular;  Laterality: N/A;   PARS PLANA VITRECTOMY  07/03/2011   Procedure: PARS PLANA VITRECTOMY WITH 25 GAUGE;  Surgeon: Rush JONETTA Ku, MD;  Location: [facility] OR;  Service: Ophthalmology;  Laterality: Right;  MEMBRANE PEEL, HEAD SCOPE LASER, GAS   POLYPECTOMY  07/05/2021   Procedure: POLYPECTOMY;  Surgeon: Cindie Carlin POUR, DO;  Location: [facility];  Service: Endoscopy;;   RETINAL DETACHMENT SURGERY     RETINAL DETACHMENT SURGERY  07/03/11   right   TONSILLECTOMY       Social History   Socioeconomic History   Marital status: Widowed    Spouse name: Not on file   Number of children: Not on file   Years of education: Not on file   Highest education level: Not on file  Occupational History   Not on file  Tobacco Use   Smoking status: Never   Smokeless tobacco: Never  Vaping Use   Vaping status: Never Used  Substance and Sexual Activity   Alcohol use: No   Drug use: No   Sexual activity: Not on file    Comment: single-2 grown children  Other Topics Concern   Not on file  Social History Narrative   Not on file   Social Drivers of Health   Financial Resource Strain: Not on file  Food Insecurity: Not on file  Transportation Needs: Not on file  Physical Activity: Not on file  Stress: Not on file  Social Connections: Not on file    Family History  Problem Relation Age of Onset   Heart disease Mother    Ovarian cancer Mother  Stroke Father    Diabetes Sister    Diabetes Brother    Diabetes Sister    Heart disease Brother    Cancer Brother        spine   Anesthesia problems Neg Hx     Outpatient Encounter Medications as of 01/04/2024  Medication Sig   Continuous Glucose Receiver (FREESTYLE LIBRE 3 READER) DEVI 1 Piece by Does not apply route once as needed for up to 1 dose.   Continuous Glucose Sensor (FREESTYLE LIBRE 3 PLUS SENSOR) MISC Change sensor every 15 days.   Accu-Chek Softclix Lancets lancets USE TO CHECK BLOOD GLUCOSE TWO TIMES DAILY AS DIRECTED   acetaminophen  (TYLENOL ) 650 MG CR tablet Take 650 mg by mouth every 8 (eight) hours as needed for pain.   aspirin  EC 81 MG tablet Take 81 mg by mouth daily.   Blood Glucose Monitoring Suppl (ACCU-CHEK GUIDE ME) w/Device KIT Use to check blood glucose four times daily   Calcium -Vitamin D -Vitamin K 500-500-40 MG-UNT-MCG CHEW Chew 1 each by mouth daily.   carvedilol  (COREG ) 12.5 MG tablet Take 1 tablet (12.5 mg total) by mouth 2 (two) times daily with a meal.   celecoxib   (CELEBREX ) 200 MG capsule TAKE 1 CAPSULE BY MOUTH TWICE DAILY AS NEEDED BETWEEN  MEALS   Cholecalciferol (VITAMIN D3 GUMMIES ADULT PO) Take 2,000 Units by mouth daily.   Cyanocobalamin  (VITAMIN B-12 PO) Take 1 tablet by mouth daily.   escitalopram (LEXAPRO) 10 MG tablet Take 10 mg by mouth daily.   FARXIGA  10 MG TABS tablet Take 1 tablet by mouth once daily with breakfast   fenofibrate  (TRICOR ) 145 MG tablet TAKE 1 TABLET(145 MG) BY MOUTH DAILY   glucose blood (ACCU-CHEK GUIDE TEST) test strip 1 each by Other route in the morning and at bedtime. Use to monitor glucose 4 times a day  as instructed   insulin  degludec (TRESIBA  FLEXTOUCH) 100 UNIT/ML FlexTouch Pen Inject 26 Units into the skin at bedtime.   Insulin  Pen Needle 29G X MISC Use to inject insulin  daily   MAGNESIUM  CITRATE PO Take 1 tablet by mouth daily.   metFORMIN  (GLUCOPHAGE ) 1000 MG tablet Take 1 tablet (1,000 mg total) by mouth 2 (two) times daily with a meal.   Multiple Vitamins-Minerals (CENTRUM SILVER ULTRA WOMENS PO) Take 2 tablets by mouth daily.   nitroGLYCERIN  (NITROSTAT ) 0.4 MG SL tablet Place 1 tablet (0.4 mg total) under the tongue every 5 (five) minutes as needed for chest pain.   Omega-3 Fatty Acids (FISH OIL PO) Take 1 capsule by mouth daily at 6 (six) AM. Per patient taking daily not taking at 6:00 am   omeprazole (PRILOSEC) 20 MG capsule Take 20 mg by mouth daily.   ramipril (ALTACE) 10 MG capsule Take 10 mg by mouth daily.   rosuvastatin  (CRESTOR ) 40 MG tablet Take 1 tablet by mouth once daily   Vibegron  (GEMTESA ) 75 MG TABS Take 1 tablet (75 mg total) by mouth daily.   vitamin C (ASCORBIC ACID) 250 MG tablet Take 500 mg by mouth daily.   zinc gluconate 50 MG tablet Take 50 mg by mouth daily.   [DISCONTINUED] glipiZIDE  (GLUCOTROL  XL) 5 MG 24 hr tablet Take 1 tablet (5 mg total) by mouth daily with breakfast.   [DISCONTINUED] insulin  degludec (TRESIBA  FLEXTOUCH) 100 UNIT/ML FlexTouch Pen Inject 20 Units into the  skin at bedtime.   No facility-administered encounter medications on file as of 01/04/2024.    ALLERGIES: Allergies  Allergen Reactions  Clopidogrel Bisulfate Hives   Oxycontin  [Oxycodone  Hcl] Nausea And Vomiting    VACCINATION STATUS: Immunization History  Administered Date(s) Administered   Influenza,inj,Quad PF,6+ Mos 04/03/2016   PFIZER(Purple Top)SARS-COV-2 Vaccination 07/31/2019, 08/24/2019    Diabetes She presents for her follow-up diabetic visit. She has type 2 diabetes mellitus. Onset time: She was diagnosed at approximate age of 72 years. Her disease course has been improving. There are no hypoglycemic associated symptoms. Pertinent negatives for hypoglycemia include no confusion, headaches, pallor or seizures. Associated symptoms include fatigue. Pertinent negatives for diabetes include no chest pain, no polydipsia, no polyphagia and no polyuria. There are no hypoglycemic complications. Symptoms are improving. There are no diabetic complications. Risk factors for coronary artery disease include diabetes mellitus, dyslipidemia, hypertension, family history, post-menopausal and sedentary lifestyle. Current diabetic treatments: She is currently on Farxiga  10 mg p.o. daily at breakfast, Janumet  50/1000 mg twice daily. Her weight is decreasing steadily. She is following a generally unhealthy diet. When asked about meal planning, she reported none. She has not had a previous visit with a dietitian. She rarely participates in exercise. Her home blood glucose trend is decreasing steadily. Her breakfast blood glucose range is generally 140-180 mg/dl. Her lunch blood glucose range is generally 140-180 mg/dl. Her dinner blood glucose range is generally 140-180 mg/dl. Her bedtime blood glucose range is generally 140-180 mg/dl. Her overall blood glucose range is 140-180 mg/dl. (She presents with improved glycemic profile averaging 172 over the last 7 days compared to 215 for the last 90 days.  She  became comfortable using her basal insulin  started during her last visit. She denies or did not document  hypoglycemia.    ) An ACE inhibitor/angiotensin II receptor blocker is being taken.  Hypertension This is a chronic problem. The current episode started more than 1 year ago. The problem is controlled. Pertinent negatives include no chest pain, headaches, palpitations or shortness of breath. Risk factors for coronary artery disease include diabetes mellitus, sedentary lifestyle, post-menopausal state, dyslipidemia and family history. Past treatments include ACE inhibitors. The current treatment provides moderate improvement.  Hyperlipidemia This is a chronic problem. The current episode started more than 1 year ago. Exacerbating diseases include diabetes. Pertinent negatives include no chest pain, myalgias or shortness of breath. Current antihyperlipidemic treatment includes statins. Risk factors for coronary artery disease include diabetes mellitus, dyslipidemia, hypertension, a sedentary lifestyle and post-menopausal.       Objective:       01/04/2024    1:02 PM 12/15/2023    2:09 PM 09/14/2023    2:10 PM  Vitals with BMI  Height 5' 5 5' 5 5' 5  Weight 160 lbs 3 oz 162 lbs 3 oz 162 lbs  BMI 26.66 26.99 26.96  Systolic 130 134 867  Diastolic 66 76 66  Pulse 92 84 96    BP 130/66   Pulse 92   Ht 5' 5 (1.651 m)   Wt 160 lb 3.2 oz (72.7 kg)   BMI 26.66 kg/m   Wt Readings from Last 3 Encounters:  01/04/24 160 lb 3.2 oz (72.7 kg)  12/15/23 162 lb 3.2 oz (73.6 kg)  09/14/23 162 lb (73.5 kg)      CMP ( most recent) CMP     Component Value Date/Time   NA 139 12/10/2023 0916   K 5.0 12/10/2023 0916   CL 101 12/10/2023 0916   CO2 21 12/10/2023 0916   GLUCOSE 232 (H) 12/10/2023 0916   GLUCOSE 286 (H) 03/16/2022 2002  BUN 19 12/10/2023 0916   CREATININE 0.76 12/10/2023 0916   CALCIUM  10.1 12/10/2023 0916   PROT 6.6 12/10/2023 0916   ALBUMIN 4.3 12/10/2023 0916   AST  25 12/10/2023 0916   ALT 23 12/10/2023 0916   ALKPHOS 66 12/10/2023 0916   BILITOT 0.3 12/10/2023 0916   GFRNONAA >60 03/16/2022 1948   GFRAA 52 (L) 07/17/2017 2118     Diabetic Labs (most recent): Lab Results  Component Value Date   HGBA1C 10.7 (A) 12/15/2023   HGBA1C 10.0 (A) 09/14/2023   HGBA1C 9.4 (A) 06/15/2023     Lipid Panel ( most recent) Lipid Panel     Component Value Date/Time   CHOL 147 12/10/2023 0916   TRIG 117 12/10/2023 0916   HDL 45 12/10/2023 0916   CHOLHDL 3.3 12/10/2023 0916   CHOLHDL 3.4 12/30/2021 1445   VLDL 49 (H) 12/30/2021 1445   LDLCALC 81 12/10/2023 0916   LABVLDL 21 12/10/2023 0916      Assessment & Plan:   1. Type 2 diabetes mellitus without complication, without long-term current use of insulin  (HCC)   - MALISSA SLAY has currently uncontrolled symptomatic type 2 DM since  78 years of age.  She presents with improved glycemic profile averaging 172 over the last 7 days compared to 215 for the last 90 days.  She became comfortable using her basal insulin  started during her last visit. She denies or did not document  hypoglycemia.     - I had a long discussion with her about the possible risk factors and  the pathology behind its diabetes and its complications. -her diabetes is complicated by sedentary life, dietary indiscretions and she remains at exceedingly high risk for more acute and chronic complications which include CAD, CVA, CKD, retinopathy, and neuropathy. These are all discussed in detail with her.  - I discussed all available options of managing her diabetes including de-escalation of medications. I have counseled her on Food as Medicine by adopting a Whole Food , Plant Predominant  ( WFPP) nutrition as recommended by [employer] of Lifestyle Medicine. Patient is encouraged to switch to  unprocessed or minimally processed  complex starch, adequate protein intake (mainly plant source), minimal liquid fat, plenty of fruits,  and vegetables. -  she is advised to stick to a routine mealtimes to eat 3 complete meals a day and snack only when necessary ( to snack only to correct hypoglycemia BG <70 day time or <100 at night).  Patient made significant change in her diet and benefiting from lifestyle medicine. In light of her desire to avoid injectable treatment for diabetes she remains a good candidate for lifestyle medicine.   - she acknowledges that there is a room for improvement in her food and drink choices. - Suggestion is made for her to avoid simple carbohydrates  from her diet including Cakes, Sweet Desserts, Ice Cream, Soda (diet and regular), Sweet Tea, Candies, Chips, Cookies, Store Bought Juices, Alcohol , Artificial Sweeteners,  Coffee Creamer, and Sugar-free Products, Lemonade. This will help patient to have more stable blood glucose profile and potentially avoid unintended weight gain.  She did not engage optimally, however she intends to follow the [employer] of lifestyle medicine nutrition which was   discussed and and offered to patient. A. Whole Foods, Plant-Based Nutrition comprising of fruits and vegetables, plant-based proteins, whole-grain carbohydrates was discussed in detail with the patient.   A list for source of those nutrients were also provided to the patient.  Patient will use only water or unsweetened tea for hydration. B.  The need to stay away from risky substances including alcohol, smoking; obtaining 7 to 9 hours of restorative sleep, at least 150 minutes of moderate intensity exercise weekly, the importance of healthy social connections,  and stress management techniques were discussed. C.  A full color page of  Calorie density of various food groups per pound showing examples of each food groups was provided to the patient.   - she is  scheduled with Penny Crumpton, RDN, CDE for individualized diabetes education.  - I have approached her with the following individualized plan  to manage  her diabetes and patient agrees:   -In light of her presentation with significantly improved glycemic profile, she would not need prandial insulin  for now.   - She will continue to benefit from basal insulin , discussed and increased her Tresiba  to 26 units nightly.  - She is advised to continue monitoring blood glucose 4 times a day, however this patient will benefit from a CGM.  I discussed and prescribed the freestyle libre device for her. - She is advised to continue metformin  1000 mg p.o. twice daily, Farxiga  10 mg p.o. daily at breakfast. - At this time, I advised her to discontinue glipizide .    - she is encouraged to call clinic for blood glucose levels less than 70 or above 200 mg /dl x 3 days in a week.  - Specific targets for  A1c;  LDL, HDL,  and Triglycerides were discussed with the patient.  2) Blood Pressure /Hypertension:   -Her blood pressure is controlled to target.  she is advised to continue her current medications including ramipril 10 mg p.o. daily with breakfast .   3) Lipids/Hyperlipidemia:   Her lipid panel is controlled LDL at 81.  She is advised to continue Crestor  40 mg p.o. daily at bedtime.  She is also on fenofibrate  145 mg p.o. daily.  Fenofibrate  will be discontinued on subsequent visits.   Side effects and precautions discussed with her.  4)  Weight/Diet:  Body mass index is 26.66 kg/m.  -   she is  a candidate for some weight loss. I discussed with her the fact that loss of 5 - 10% of her  current body weight will have the most impact on her diabetes management.  The above detailed  AC[facility] recommendations for nutrition, exercise, sleep, social life, avoidance of risky substances, the need for restorative sleep   information will also detailed on discharge instructions.  5) Chronic Care/Health Maintenance:  -she  is on ACEI/ARB and Statin medications and  is encouraged to initiate and continue to follow up with Ophthalmology, Dentist,  Podiatrist at  least yearly or according to recommendations, and advised to   stay away from smoking. I have recommended yearly flu vaccine and pneumonia vaccine at least every 5 years; moderate intensity exercise for up to 150 minutes weekly; and  sleep for 7- 9 hours a day.  - she is  advised to maintain close follow up with Marvine Rush, MD for primary care needs, as well as her other providers for optimal and coordinated care.   I spent  25  minutes in the care of the patient today including review of labs from CMP, Lipids, Thyroid  Function, Hematology (current and previous including abstractions from other facilities); face-to-face time discussing  her blood glucose readings/logs, discussing hypoglycemia and hyperglycemia episodes and symptoms, medications doses, her options of short and long term treatment based  on the latest standards of care / guidelines;  discussion about incorporating lifestyle medicine;  and documenting the encounter. Risk reduction counseling performed per USPSTF guidelines to reduce cardiovascular risk factors.     Please refer to Patient Instructions for Blood Glucose Monitoring and Insulin /Medications Dosing Guide  in media tab for additional information. Please  also refer to  Patient Self Inventory in the Media  tab for reviewed elements of pertinent patient history.  Erika Ramos participated in the discussions, expressed understanding, and voiced agreement with the above plans.  All questions were answered to her satisfaction. she is encouraged to contact clinic should she have any questions or concerns prior to her return visit.   Follow up plan: - Return in about 3 months (around 04/05/2024) for Bring Meter/CGM Device/Logs- A1c in Office.  Ranny Earl, MD [facility] [address] [place], [place] [address] Phone: [phone]  Fax: [phone]    01/04/2024, 5:56 PM  This note was partially dictated with  voice recognition software. Similar sounding words can be transcribed inadequately or may not  be corrected upon review.

## 2024-01-04 NOTE — Patient Instructions (Signed)

## 2024-01-08 ENCOUNTER — Encounter (INDEPENDENT_AMBULATORY_CARE_PROVIDER_SITE_OTHER): Payer: Self-pay

## 2024-01-11 ENCOUNTER — Telehealth: Payer: Self-pay | Admitting: "Endocrinology

## 2024-01-11 NOTE — Telephone Encounter (Signed)
 Called pt to sch her a nurse visit for Thursday per Joy. Her VM was full

## 2024-01-12 ENCOUNTER — Ambulatory Visit: Attending: Student | Admitting: Student

## 2024-01-12 ENCOUNTER — Ambulatory Visit (INDEPENDENT_AMBULATORY_CARE_PROVIDER_SITE_OTHER)

## 2024-01-12 ENCOUNTER — Encounter: Payer: Self-pay | Admitting: Student

## 2024-01-12 VITALS — BP 140/78 | HR 108 | Ht 65.0 in | Wt 162.6 lb

## 2024-01-12 DIAGNOSIS — Z794 Long term (current) use of insulin: Secondary | ICD-10-CM

## 2024-01-12 DIAGNOSIS — E119 Type 2 diabetes mellitus without complications: Secondary | ICD-10-CM | POA: Diagnosis not present

## 2024-01-12 DIAGNOSIS — I10 Essential (primary) hypertension: Secondary | ICD-10-CM

## 2024-01-12 DIAGNOSIS — I2510 Atherosclerotic heart disease of native coronary artery without angina pectoris: Secondary | ICD-10-CM

## 2024-01-12 DIAGNOSIS — E785 Hyperlipidemia, unspecified: Secondary | ICD-10-CM

## 2024-01-12 DIAGNOSIS — R002 Palpitations: Secondary | ICD-10-CM | POA: Diagnosis not present

## 2024-01-12 MED ORDER — CARVEDILOL 12.5 MG PO TABS: 12.5000 mg | ORAL_TABLET | Freq: Two times a day (BID) | ORAL | 3 refills | Status: AC

## 2024-01-12 NOTE — Progress Notes (Signed)
 Pt seen for Nurse Visit for education and application of CGM system. Reviewed and demonstrated set up of [facility] 3 reader. Discussed, demonstrated and applied Libre 3 Plus sensor to L upper arm without difficulty. Discussed and answered all pt's questions and advised her to call the office if she had any further questions. Pt voiced understanding.

## 2024-01-12 NOTE — Progress Notes (Signed)
 Cardiology Office Note    Date:  01/12/2024  ID:  Erika, Ramos [birth year], MRN [id number] Cardiologist: Erika Emmer, MD { : History of Present Illness:    Erika Ramos is a 78 y.o. female with past medical history of CAD (s/p PCI or RCA and PDA in 2001, cardiac cath in 08/2021 showing patent stents but 30% distal LM, 50% mid-LAD, 85% distal LAD, 90% D1, 30% prox-LCx, 60% mid-RDA and 90% PDA stenosis with medical management recommended), history of breast cancer, HTN, HLD and Type 2 DM who presents to the office today for overdue 73-month follow-up.  She was last examined by Dr. Emmer in 01/2023 and reported having occasional episodes of chest pain at night which were felt to be atypical for angina and denied any exertional symptoms. She was continued on her current medical therapy with ASA 81 mg daily, Coreg  12.5 mg twice daily, Farxiga  10 mg daily, Fenofibrate  145 mg daily, Ramipril 10 mg daily and Crestor  40 mg daily.  In talking with the patient today, she reports overall doing well since her last office visit. Her blood pressure and heart rate are elevated today but improved by recheck. Reports being in a hurry as she was late for her appointment due to waiting on a tree service to come to her home.  Reports her breathing has been stable and denies any progressive dyspnea on exertion or exertional chest pain. No specific orthopnea, PND or pitting edema. Does experience occasional episodes of her heart racing which is typically most notable at night and says this has been occurring for several years. She does not consume a significant amount of caffeine or alcohol. She has been without Coreg  for over a month due to changing her pharmacy. She has made significant dietary changes since her last visit with Endocrinology as she was started on Insulin  at that time. She is trying to limit her sweets.  Studies Reviewed:   EKG: EKG is ordered today and demonstrates:   EKG  Interpretation Date/Time:  Tuesday January 12 2024 13:48:37 EDT Ventricular Rate:  108 PR Interval:  128 QRS Duration:  82 QT Interval:  354 QTC Calculation: 474 R Axis:   -53  Text Interpretation: Sinus tachycardia Left anterior fascicular block Inferior infarct pattern Confirmed by Johnson Grate ([id number]) on 01/12/2024 1:50:17 PM       Cardiac Catheterization: 08/2021 CONCLUSIONS: Severe diffuse heavy coronary calcification 30% distal left main Mid 50%, mid 65%, and distal 85% LAD.  A branch of the first diagonal contains 90% stenosis. 30% proximal circumflex Segment of proximal to mid 60% RCA.  Widely patent distal LAD stent with diffuse 30% narrowing.  Mid to distal PDA 90% stenosis. Normal LV function and LVEDP.  EF estimated to be 60%.   RECOMMENDATIONS: Aggressive preventive therapy with LDL lowering less than 55. Anti-ischemic therapy as required to control symptoms.  The current symptoms are not ischemia related. The current most severe lesions are potentially approachable but would carry a higher than usual complication risk and in the absence of convincing symptoms, stenting does not have a clinical utility.  Risk Assessment/Calculations:     HYPERTENSION CONTROL Vitals:   01/12/24 1346 01/12/24 1407  BP: (!) 158/72 (!) 140/78    The patient's blood pressure is elevated above target today. In order to address the patient's elevated BP: A new medication was prescribed today.      Physical Exam:   VS:  BP (!) 140/78   Pulse (!) 108  Ht 5' 5 (1.651 m)   Wt 162 lb 9.6 oz (73.8 kg)   SpO2 93%   BMI 27.06 kg/m    Wt Readings from Last 3 Encounters:  01/12/24 162 lb 9.6 oz (73.8 kg)  01/04/24 160 lb 3.2 oz (72.7 kg)  12/15/23 162 lb 3.2 oz (73.6 kg)     GEN: Well nourished, well developed female appearing in no acute distress NECK: No JVD; No carotid bruits CARDIAC: RRR, no murmurs, rubs, gallops RESPIRATORY:  Clear to auscultation without rales,  wheezing or rhonchi  ABDOMEN: Appears non-distended. No obvious abdominal masses. EXTREMITIES: No clubbing or cyanosis. No pitting edema.  Distal pedal pulses are 2+ bilaterally.   Assessment and Plan:   1. Coronary artery disease of native artery of native heart with stable angina pectoris [facility]) - She has a history of stenting to the RCA and PDA in 2001 and cardiac catheterization in 08/2021 showed patent stents with diffuse disease as outlined above and medical management was recommended.  - She denies any recent anginal symptoms.  - Continue ASA 81 mg daily and Crestor  40 mg daily. Will restart Coreg  12.5 mg twice daily.   2. Essential hypertension - BP was initially recorded at 158/72, rechecked and improved to 140/78 during today's visit. She remains on Ramipril 10 mg daily. Did recommend restarting Coreg  12.5 mg twice daily as she was previously on this and overall tolerating well. Provided with a BP log and encouraged to return this in 1 to 2 months for reassessment.  3. Hyperlipidemia LDL goal <70 - LDL was at 63 in 2023 but had trended up to 81 when checked in 11/2023. She is making dietary changes and would continue to follow with this. Continue current medical therapy for now with Crestor  40 mg daily and Fenofibrate  145 mg daily. If LDL remains above goal despite dietary changes, could add Zetia 10 mg daily.  4. Type 2 DM - Followed by Endocrinology. Hgb A1c was at 10.7 when checked in 11/2023. She has made significant dietary changes in the interim and is limiting her sweets and focusing on portion control.  5. Palpitations - She reports symptoms are typically most notable at night and spontaneously resolve. No change in symptoms over the past few years. Will restart Coreg  12.5 mg twice daily as discussed above. I encouraged her to make us  aware if symptoms do not improve as we can place a 1 week Zio patch to assess for arrhythmias.  Signed, Laymon CHRISTELLA Qua, PA-C

## 2024-01-12 NOTE — Patient Instructions (Addendum)
 Medication Instructions:  Restart Coreg  12.5 mg Two Times Daily   Monitor your BP readings and record.   *If you need a refill on your cardiac medications before your next appointment, please call your pharmacy*  Lab Work: NONE   If you have labs (blood work) drawn today and your tests are completely normal, you will receive your results only by: MyChart Message (if you have MyChart) OR A paper copy in the mail If you have any lab test that is abnormal or we need to change your treatment, we will call you to review the results.  Testing/Procedures: NONE   Follow-Up: At [facility], you and your health needs are our priority.  As part of our continuing mission to provide you with exceptional heart care, our providers are all part of one team.  This team includes your primary Cardiologist (physician) and Advanced Practice Providers or APPs (Physician Assistants and Nurse Practitioners) who all work together to provide you with the care you need, when you need it.  Your next appointment:   6 month(s)  Provider:   You may see Maude Emmer, MD or one of the following Advanced Practice Providers on your designated Care Team:   Laymon Qua, PA-C  [place], [place] Olivia Pavy, [place]     We recommend signing up for the patient portal called MyChart.  Sign up information is provided on this After Visit Summary.  MyChart is used to connect with patients for Virtual Visits (Telemedicine).  Patients are able to view lab/test results, encounter notes, upcoming appointments, etc.  Non-urgent messages can be sent to your provider as well.   To learn more about what you can do with MyChart, go to [email or web address].   Other Instructions Thank you for choosing [facility]!

## 2024-01-13 DIAGNOSIS — M7630 Iliotibial band syndrome, unspecified leg: Secondary | ICD-10-CM | POA: Diagnosis not present

## 2024-01-14 ENCOUNTER — Other Ambulatory Visit: Payer: Self-pay | Admitting: Orthopaedic Surgery

## 2024-01-15 DIAGNOSIS — M7630 Iliotibial band syndrome, unspecified leg: Secondary | ICD-10-CM | POA: Diagnosis not present

## 2024-01-18 DIAGNOSIS — M7630 Iliotibial band syndrome, unspecified leg: Secondary | ICD-10-CM | POA: Diagnosis not present

## 2024-01-21 DIAGNOSIS — M7630 Iliotibial band syndrome, unspecified leg: Secondary | ICD-10-CM | POA: Diagnosis not present

## 2024-01-25 DIAGNOSIS — M7630 Iliotibial band syndrome, unspecified leg: Secondary | ICD-10-CM | POA: Diagnosis not present

## 2024-01-28 DIAGNOSIS — M7630 Iliotibial band syndrome, unspecified leg: Secondary | ICD-10-CM | POA: Diagnosis not present

## 2024-02-01 DIAGNOSIS — M7630 Iliotibial band syndrome, unspecified leg: Secondary | ICD-10-CM | POA: Diagnosis not present

## 2024-02-03 DIAGNOSIS — E663 Overweight: Secondary | ICD-10-CM | POA: Diagnosis not present

## 2024-02-03 DIAGNOSIS — M7630 Iliotibial band syndrome, unspecified leg: Secondary | ICD-10-CM | POA: Diagnosis not present

## 2024-02-03 DIAGNOSIS — Z1331 Encounter for screening for depression: Secondary | ICD-10-CM | POA: Diagnosis not present

## 2024-02-03 DIAGNOSIS — Z6828 Body mass index (BMI) 28.0-28.9, adult: Secondary | ICD-10-CM | POA: Diagnosis not present

## 2024-02-03 DIAGNOSIS — Z0001 Encounter for general adult medical examination with abnormal findings: Secondary | ICD-10-CM | POA: Diagnosis not present

## 2024-02-04 DIAGNOSIS — M25552 Pain in left hip: Secondary | ICD-10-CM | POA: Diagnosis not present

## 2024-02-09 ENCOUNTER — Other Ambulatory Visit: Payer: Self-pay | Admitting: Nurse Practitioner

## 2024-02-09 ENCOUNTER — Other Ambulatory Visit: Payer: Self-pay

## 2024-02-09 ENCOUNTER — Ambulatory Visit (HOSPITAL_COMMUNITY)

## 2024-02-09 DIAGNOSIS — E119 Type 2 diabetes mellitus without complications: Secondary | ICD-10-CM

## 2024-02-09 MED ORDER — TRESIBA FLEXTOUCH 100 UNIT/ML ~~LOC~~ SOPN: 26.0000 [IU] | PEN_INJECTOR | Freq: Every day | SUBCUTANEOUS | 0 refills | Status: DC

## 2024-02-11 DIAGNOSIS — H5213 Myopia, bilateral: Secondary | ICD-10-CM | POA: Diagnosis not present

## 2024-02-11 DIAGNOSIS — H43393 Other vitreous opacities, bilateral: Secondary | ICD-10-CM | POA: Diagnosis not present

## 2024-02-11 DIAGNOSIS — H33303 Unspecified retinal break, bilateral: Secondary | ICD-10-CM | POA: Diagnosis not present

## 2024-02-11 DIAGNOSIS — E119 Type 2 diabetes mellitus without complications: Secondary | ICD-10-CM | POA: Diagnosis not present

## 2024-02-11 DIAGNOSIS — H53143 Visual discomfort, bilateral: Secondary | ICD-10-CM | POA: Diagnosis not present

## 2024-02-12 MED ORDER — FENOFIBRATE 145 MG PO TABS: 145.0000 mg | ORAL_TABLET | Freq: Every day | ORAL | 3 refills | Status: AC

## 2024-02-15 ENCOUNTER — Telehealth: Payer: Self-pay | Admitting: Pharmacy Technician

## 2024-02-15 ENCOUNTER — Other Ambulatory Visit (HOSPITAL_COMMUNITY): Payer: Self-pay

## 2024-02-15 NOTE — Telephone Encounter (Signed)
 Pharmacy Patient Advocate Encounter   Received notification from CoverMyMeds that prior authorization for Insulin  Degludec FlexTouch 100UNIT/ML pen-injectors is required/requested.   Insurance verification completed.   The patient is insured through [employer] [facility] . Key: [id number]   Per test claim: The current 23 day co-pay is, $35.00.  No PA needed at this time. This test claim was processed through [facility]- copay amounts may vary at other pharmacies due to pharmacy/plan contracts, or as the patient moves through the different stages of their insurance plan.

## 2024-02-29 ENCOUNTER — Ambulatory Visit (INDEPENDENT_AMBULATORY_CARE_PROVIDER_SITE_OTHER): Payer: Medicare HMO | Admitting: Urology

## 2024-02-29 ENCOUNTER — Encounter: Payer: Self-pay | Admitting: Urology

## 2024-02-29 VITALS — BP 110/69 | HR 91

## 2024-02-29 DIAGNOSIS — R350 Frequency of micturition: Secondary | ICD-10-CM | POA: Diagnosis not present

## 2024-02-29 DIAGNOSIS — N3021 Other chronic cystitis with hematuria: Secondary | ICD-10-CM

## 2024-02-29 MED ORDER — SOLIFENACIN SUCCINATE 5 MG PO TABS: 5.0000 mg | ORAL_TABLET | Freq: Every day | ORAL | 11 refills | Status: DC

## 2024-02-29 NOTE — Patient Instructions (Signed)

## 2024-02-29 NOTE — Progress Notes (Signed)
 02/29/2024 2:48 PM   Erika Ramos Sep 28, 1945 [id number]  Referring provider: Marvine Rush, MD [address] [place],  [place] [address]  Followup urinary frequency   HPI: Erika Ramos is a 78yo here for followup for urinary frequency and OAB. She is currently on Gemtesa  75mg  daily. She gets up 3-4 times per night. She is on farxiga . She decreases fluid intake with 2 hours of going to bed. Her insurance carrier sent her a letter to switch to an anticholinergic    PMH: Past Medical History:  Diagnosis Date   Breast cancer (HCC)    Breast cancer, stage 2, right (HCC) 09/06/2009   Qualifier: Diagnosis of  By: Wynetta, CMA, Carol     Coronary artery disease    Diabetes mellitus without complication (HCC)    Fatty liver disease, nonalcoholic    GERD (gastroesophageal reflux disease)    Hypertension    PONV (postoperative nausea and vomiting)    Nausea/ Vomitting- last time 2010   Stented coronary artery     Surgical History: Past Surgical History:  Procedure Laterality Date   ABDOMINAL HYSTERECTOMY     BREAST SURGERY  March 2010   cataract  Right 09/2012   CATARACT EXTRACTION  01/01/2018   left eye   COLONOSCOPY WITH PROPOFOL  N/A 07/05/2021   Procedure: COLONOSCOPY WITH PROPOFOL ;  Surgeon: Cindie Carlin POUR, DO;  Location: [facility];  Service: Endoscopy;  Laterality: N/A;  8:45am   FRACTURE SURGERY     wrist  right   LEFT HEART CATH AND CORONARY ANGIOGRAPHY N/A 09/10/2021   Procedure: LEFT HEART CATH AND CORONARY ANGIOGRAPHY;  Surgeon: Claudene Victory ORN, MD;  Location: [facility] INVASIVE CV LAB;  Service: Cardiovascular;  Laterality: N/A;   PARS PLANA VITRECTOMY  07/03/2011   Procedure: PARS PLANA VITRECTOMY WITH 25 GAUGE;  Surgeon: Rush JONETTA Ku, MD;  Location: [facility];  Service: Ophthalmology;  Laterality: Right;  MEMBRANE PEEL, HEAD SCOPE LASER, GAS   POLYPECTOMY  07/05/2021   Procedure: POLYPECTOMY;  Surgeon: Cindie Carlin POUR, DO;  Location: [facility];  Service:  Endoscopy;;   RETINAL DETACHMENT SURGERY     RETINAL DETACHMENT SURGERY  07/03/11   right   TONSILLECTOMY      Home Medications:  Allergies as of 02/29/2024       Reactions   Clopidogrel Bisulfate Hives   Oxycontin  [oxycodone  Hcl] Nausea And Vomiting        Medication List        Accurate as of February 29, 2024  2:48 PM. If you have any questions, ask your nurse or doctor.          Accu-Chek Guide Me w/Device Kit Use to check blood glucose four times daily   Accu-Chek Guide Test test strip Generic drug: glucose blood 1 each by Other route in the morning and at bedtime. Use to monitor glucose 4 times a day  as instructed   Accu-Chek Softclix Lancets lancets USE TO CHECK BLOOD GLUCOSE TWO TIMES DAILY AS DIRECTED   acetaminophen  650 MG CR tablet Commonly known as: TYLENOL  Take 650 mg by mouth every 8 (eight) hours as needed for pain.   aspirin  EC 81 MG tablet Take 81 mg by mouth daily.   Calcium -Vitamin D -Vitamin K 500-500-40 MG-UNT-MCG Chew Chew 1 each by mouth daily.   carvedilol  12.5 MG tablet Commonly known as: COREG  Take 1 tablet (12.5 mg total) by mouth 2 (two) times daily with a meal.   celecoxib  200 MG capsule Commonly  known as: CELEBREX  TAKE 1 CAPSULE BY MOUTH TWICE DAILY AS NEEDED BETWEEN  MEALS   CENTRUM SILVER ULTRA WOMENS PO Take 2 tablets by mouth daily.   escitalopram 10 MG tablet Commonly known as: LEXAPRO Take 10 mg by mouth daily.   Farxiga  10 MG Tabs tablet Generic drug: dapagliflozin  propanediol Take 1 tablet by mouth once daily with breakfast   fenofibrate  145 MG tablet Commonly known as: TRICOR  Take 1 tablet (145 mg total) by mouth daily.   FISH OIL PO Take 1 capsule by mouth daily at 6 (six) AM. Per patient taking daily not taking at 6:00 am   FreeStyle Libre 3 Plus Sensor Misc Change sensor every 15 days.   FreeStyle Libre 3 Reader Devi 1 Piece by Does not apply route once as needed for up to 1 dose.   Gemtesa  75 MG  Tabs Generic drug: Vibegron  Take 1 tablet (75 mg total) by mouth daily.   Insulin  Pen Needle 29G X Misc Use to inject insulin  daily   metFORMIN  1000 MG tablet Commonly known as: GLUCOPHAGE  Take 1 tablet (1,000 mg total) by mouth 2 (two) times daily with a meal.   nitroGLYCERIN  0.4 MG SL tablet Commonly known as: NITROSTAT  Place 1 tablet (0.4 mg total) under the tongue every 5 (five) minutes as needed for chest pain.   omeprazole 20 MG capsule Commonly known as: PRILOSEC Take 20 mg by mouth daily.   ramipril 10 MG capsule Commonly known as: ALTACE Take 10 mg by mouth daily.   rosuvastatin  40 MG tablet Commonly known as: CRESTOR  Take 1 tablet by mouth once daily   Tresiba  FlexTouch 100 UNIT/ML FlexTouch Pen Generic drug: insulin  degludec Inject 26 Units into the skin at bedtime.   VITAMIN B-12 PO Take 1 tablet by mouth daily.   vitamin C 250 MG tablet Commonly known as: ASCORBIC ACID Take 500 mg by mouth daily.   VITAMIN D3 GUMMIES ADULT PO Take 2,000 Units by mouth daily.        Allergies:  Allergies  Allergen Reactions   Clopidogrel Bisulfate Hives   Oxycontin  [Oxycodone  Hcl] Nausea And Vomiting    Family History: Family History  Problem Relation Age of Onset   Heart disease Mother    Ovarian cancer Mother    Stroke Father    Diabetes Sister    Diabetes Brother    Diabetes Sister    Heart disease Brother    Cancer Brother        spine   Anesthesia problems Neg Hx     Social History:  reports that she has never smoked. She has never used smokeless tobacco. She reports that she does not drink alcohol and does not use drugs.  ROS: All other review of systems were reviewed and are negative except what is noted above in HPI  Physical Exam: BP 110/69   Pulse 91   Constitutional:  Alert and oriented, No acute distress. HEENT: [place] AT, moist mucus membranes.  Trachea midline, no masses. Cardiovascular: No clubbing, cyanosis, or  edema. Respiratory: Normal respiratory effort, no increased work of breathing. GI: Abdomen is soft, nontender, nondistended, no abdominal masses GU: No CVA tenderness.  Lymph: No cervical or inguinal lymphadenopathy. Skin: No rashes, bruises or suspicious lesions. Neurologic: Grossly intact, no focal deficits, moving all 4 extremities. Psychiatric: Normal mood and affect.  Laboratory Data: Lab Results  Component Value Date   WBC 8.7 03/16/2022   HGB 13.3 03/16/2022   HCT 39.0 03/16/2022  MCV 86.4 03/16/2022   PLT 207 03/16/2022    Lab Results  Component Value Date   CREATININE 0.76 12/10/2023    No results found for: PSA  No results found for: TESTOSTERONE  Lab Results  Component Value Date   HGBA1C 10.7 (A) 12/15/2023    Urinalysis    Component Value Date/Time   APPEARANCEUR Clear 08/19/2023 1516   GLUCOSEU 3+ (A) 08/19/2023 1516   BILIRUBINUR Negative 08/19/2023 1516   PROTEINUR Negative 08/19/2023 1516   NITRITE Negative 08/19/2023 1516   LEUKOCYTESUR Negative 08/19/2023 1516    Lab Results  Component Value Date   LABMICR Comment 08/19/2023    Pertinent Imaging:  No results found for this or any previous visit.  No results found for this or any previous visit.  No results found for this or any previous visit.  No results found for this or any previous visit.  No results found for this or any previous visit.  No results found for this or any previous visit.  No results found for this or any previous visit.  No results found for this or any previous visit.   Assessment & Plan:    1. Urinary frequency (Primary) -we will trial vesciare 5g daily    No follow-ups on file.  Belvie Clara, MD  [facility] [place]

## 2024-03-04 DIAGNOSIS — M7630 Iliotibial band syndrome, unspecified leg: Secondary | ICD-10-CM | POA: Diagnosis not present

## 2024-03-10 DIAGNOSIS — M7630 Iliotibial band syndrome, unspecified leg: Secondary | ICD-10-CM | POA: Diagnosis not present

## 2024-03-14 DIAGNOSIS — M7630 Iliotibial band syndrome, unspecified leg: Secondary | ICD-10-CM | POA: Diagnosis not present

## 2024-03-18 DIAGNOSIS — M7630 Iliotibial band syndrome, unspecified leg: Secondary | ICD-10-CM | POA: Diagnosis not present

## 2024-03-21 DIAGNOSIS — M7630 Iliotibial band syndrome, unspecified leg: Secondary | ICD-10-CM | POA: Diagnosis not present

## 2024-03-23 DIAGNOSIS — M7630 Iliotibial band syndrome, unspecified leg: Secondary | ICD-10-CM | POA: Diagnosis not present

## 2024-03-24 ENCOUNTER — Other Ambulatory Visit: Payer: Self-pay | Admitting: "Endocrinology

## 2024-03-29 ENCOUNTER — Other Ambulatory Visit: Payer: Self-pay | Admitting: "Endocrinology

## 2024-03-31 DIAGNOSIS — M7630 Iliotibial band syndrome, unspecified leg: Secondary | ICD-10-CM | POA: Diagnosis not present

## 2024-04-05 ENCOUNTER — Encounter: Payer: Self-pay | Admitting: "Endocrinology

## 2024-04-05 ENCOUNTER — Ambulatory Visit: Admitting: "Endocrinology

## 2024-04-05 VITALS — BP 116/56 | HR 80 | Ht 65.0 in | Wt 166.8 lb

## 2024-04-05 DIAGNOSIS — E119 Type 2 diabetes mellitus without complications: Secondary | ICD-10-CM | POA: Diagnosis not present

## 2024-04-05 DIAGNOSIS — Z794 Long term (current) use of insulin: Secondary | ICD-10-CM

## 2024-04-05 DIAGNOSIS — E782 Mixed hyperlipidemia: Secondary | ICD-10-CM

## 2024-04-05 DIAGNOSIS — I10 Essential (primary) hypertension: Secondary | ICD-10-CM

## 2024-04-05 LAB — POCT GLYCOSYLATED HEMOGLOBIN (HGB A1C): HbA1c, POC (controlled diabetic range): 9 % — AB (ref 0.0–7.0)

## 2024-04-05 MED ORDER — TRESIBA FLEXTOUCH 100 UNIT/ML ~~LOC~~ SOPN: 30.0000 [IU] | PEN_INJECTOR | Freq: Every day | SUBCUTANEOUS | 1 refills | Status: DC

## 2024-04-05 NOTE — Patient Instructions (Signed)

## 2024-04-05 NOTE — Progress Notes (Signed)
 04/05/2024, 2:30 PM  Endocrinology follow-up note   Subjective:    Patient ID: Erika Ramos, female    DOB: August 13, 1945.  Erika Ramos is being seen in follow-up after she was seen in consultation for management of currently uncontrolled symptomatic diabetes requested by  Erika Rush, MD.   Past Medical History:  Diagnosis Date   Breast cancer [facility])    Breast cancer, stage 2, right (HCC) 09/06/2009   Qualifier: Diagnosis of  By: Wynetta, CMA, Carol     Coronary artery disease    Diabetes mellitus without complication (HCC)    Fatty liver disease, nonalcoholic    GERD (gastroesophageal reflux disease)    Hypertension    PONV (postoperative nausea and vomiting)    Nausea/ Vomitting- last time 2010   Stented coronary artery     Past Surgical History:  Procedure Laterality Date   ABDOMINAL HYSTERECTOMY     BREAST SURGERY  March 2010   cataract  Right 09/2012   CATARACT EXTRACTION  01/01/2018   left eye   COLONOSCOPY WITH PROPOFOL  N/A 07/05/2021   Procedure: COLONOSCOPY WITH PROPOFOL ;  Surgeon: Erika Carlin POUR, DO;  Location: [facility];  Service: Endoscopy;  Laterality: N/A;  8:45am   FRACTURE SURGERY     wrist  right   LEFT HEART CATH AND CORONARY ANGIOGRAPHY N/A 09/10/2021   Procedure: LEFT HEART CATH AND CORONARY ANGIOGRAPHY;  Surgeon: Erika Victory ORN, MD;  Location: [facility];  Service: Cardiovascular;  Laterality: N/A;   PARS PLANA VITRECTOMY  07/03/2011   Procedure: PARS PLANA VITRECTOMY WITH 25 GAUGE;  Surgeon: Ramos JONETTA Ku, MD;  Location: [facility] OR;  Service: Ophthalmology;  Laterality: Right;  MEMBRANE PEEL, HEAD SCOPE LASER, GAS   POLYPECTOMY  07/05/2021   Procedure: POLYPECTOMY;  Surgeon: Erika Carlin POUR, DO;  Location: [facility];  Service: Endoscopy;;   RETINAL DETACHMENT SURGERY     RETINAL DETACHMENT SURGERY  07/03/11   right   TONSILLECTOMY       Social History   Socioeconomic History   Marital status: Widowed    Spouse name: Not on file   Number of children: Not on file   Years of education: Not on file   Highest education level: Not on file  Occupational History   Not on file  Tobacco Use   Smoking status: Never   Smokeless tobacco: Never  Vaping Use   Vaping status: Never Used  Substance and Sexual Activity   Alcohol use: No   Drug use: No   Sexual activity: Not on file    Comment: single-2 grown children  Other Topics Concern   Not on file  Social History Narrative   Not on file   Social Drivers of Health   Financial Resource Strain: Not on file  Food Insecurity: Not on file  Transportation Needs: Not on file  Physical Activity: Not on file  Stress: Not on file  Social Connections: Not on file    Family History  Problem Relation Age of Onset   Heart disease Mother    Ovarian cancer Mother  Stroke Father    Diabetes Sister    Diabetes Brother    Diabetes Sister    Heart disease Brother    Cancer Brother        spine   Anesthesia problems Neg Hx     Outpatient Encounter Medications as of 04/05/2024  Medication Sig   Accu-Chek Softclix Lancets lancets USE TO CHECK BLOOD GLUCOSE TWO TIMES DAILY AS DIRECTED   acetaminophen  (TYLENOL ) 650 MG CR tablet Take 650 mg by mouth every 8 (eight) hours as needed for pain.   aspirin  EC 81 MG tablet Take 81 mg by mouth daily.   Blood Glucose Monitoring Suppl (ACCU-CHEK GUIDE ME) w/Device KIT Use to check blood glucose four times daily   Calcium -Vitamin D -Vitamin K 500-500-40 MG-UNT-MCG CHEW Chew 1 each by mouth daily.   carvedilol  (COREG ) 12.5 MG tablet Take 1 tablet (12.5 mg total) by mouth 2 (two) times daily with a meal.   celecoxib  (CELEBREX ) 200 MG capsule TAKE 1 CAPSULE BY MOUTH TWICE DAILY AS NEEDED BETWEEN  MEALS   Cholecalciferol (VITAMIN D3 GUMMIES ADULT PO) Take 2,000 Units by mouth daily.   Continuous Glucose Receiver (FREESTYLE LIBRE 3 READER)  DEVI 1 Piece by Does not apply route once as needed for up to 1 dose.   Continuous Glucose Sensor (FREESTYLE LIBRE 3 PLUS SENSOR) MISC Change sensor every 15 days.   Cyanocobalamin  (VITAMIN B-12 PO) Take 1 tablet by mouth daily.   dapagliflozin  propanediol (FARXIGA ) 10 MG TABS tablet Take 1 tablet by mouth once daily with breakfast   escitalopram (LEXAPRO) 10 MG tablet Take 10 mg by mouth daily.   fenofibrate  (TRICOR ) 145 MG tablet Take 1 tablet (145 mg total) by mouth daily.   glucose blood (ACCU-CHEK GUIDE TEST) test strip 1 each by Other route in the morning and at bedtime. Use to monitor glucose 4 times a day  as instructed   insulin  degludec (TRESIBA  FLEXTOUCH) 100 UNIT/ML FlexTouch Pen Inject 30 Units into the skin at bedtime.   Insulin  Pen Needle 29G X MISC Use to inject insulin  daily   metFORMIN  (GLUCOPHAGE ) 1000 MG tablet TAKE 1 TABLET BY MOUTH TWICE DAILY WITH A MEAL   Multiple Vitamins-Minerals (CENTRUM SILVER ULTRA WOMENS PO) Take 2 tablets by mouth daily.   nitroGLYCERIN  (NITROSTAT ) 0.4 MG SL tablet Place 1 tablet (0.4 mg total) under the tongue every 5 (five) minutes as needed for chest pain.   Omega-3 Fatty Acids (FISH OIL PO) Take 1 capsule by mouth daily at 6 (six) AM. Per patient taking daily not taking at 6:00 am   omeprazole (PRILOSEC) 20 MG capsule Take 20 mg by mouth daily.   ramipril (ALTACE) 10 MG capsule Take 10 mg by mouth daily.   rosuvastatin  (CRESTOR ) 40 MG tablet Take 1 tablet by mouth once daily   solifenacin  (VESICARE ) 5 MG tablet Take 1 tablet (5 mg total) by mouth daily.   Vibegron  (GEMTESA ) 75 MG TABS Take 1 tablet (75 mg total) by mouth daily. (Patient not taking: Reported on 04/05/2024)   vitamin C (ASCORBIC ACID) 250 MG tablet Take 500 mg by mouth daily. (Patient not taking: Reported on 02/29/2024)   [DISCONTINUED] insulin  degludec (TRESIBA  FLEXTOUCH) 100 UNIT/ML FlexTouch Pen Inject 26 Units into the skin at bedtime.   No facility-administered encounter  medications on file as of 04/05/2024.    ALLERGIES: Allergies  Allergen Reactions   Clopidogrel Bisulfate Hives   Oxycontin  [Oxycodone  Hcl] Nausea And Vomiting    VACCINATION STATUS: Immunization History  Administered Date(s) Administered   Influenza,inj,Quad PF,6+ Mos 04/03/2016   PFIZER(Purple Top)SARS-COV-2 Vaccination 07/31/2019, 08/24/2019    Diabetes She presents for her follow-up diabetic visit. She has type 2 diabetes mellitus. Onset time: She was diagnosed at approximate age of 65 years. Her disease course has been improving. There are no hypoglycemic associated symptoms. Pertinent negatives for hypoglycemia include no confusion, headaches, pallor or seizures. Associated symptoms include fatigue. Pertinent negatives for diabetes include no chest pain, no polydipsia, no polyphagia and no polyuria. There are no hypoglycemic complications. Symptoms are improving. There are no diabetic complications. Risk factors for coronary artery disease include diabetes mellitus, dyslipidemia, hypertension, family history, post-menopausal and sedentary lifestyle. Current diabetic treatments: She is currently on Farxiga  10 mg p.o. daily at breakfast, Janumet  50/1000 mg twice daily. Her weight is increasing steadily. She is following a generally unhealthy diet. When asked about meal planning, she reported none. She has not had a previous visit with a dietitian. She rarely participates in exercise. Her home blood glucose trend is decreasing steadily. Her breakfast blood glucose range is generally 180-200 mg/dl. Her lunch blood glucose range is generally 180-200 mg/dl. Her dinner blood glucose range is generally 180-200 mg/dl. Her bedtime blood glucose range is generally 180-200 mg/dl. Her overall blood glucose range is 180-200 mg/dl. (She presents with improved glycemic profile averaging 193 for the last 14 days with glucose variability at 31.7%.  Her AGP report shows 47% time in range, 36% level 1  hyperglycemia, 17% level 2 hyperglycemia.  She is comfortable taking her injections, she denies or did not document  hypoglycemia.    ) An ACE inhibitor/angiotensin II receptor blocker is being taken.  Hypertension This is a chronic problem. The current episode started more than 1 year ago. The problem is controlled. Pertinent negatives include no chest pain, headaches, palpitations or shortness of breath. Risk factors for coronary artery disease include diabetes mellitus, sedentary lifestyle, post-menopausal state, dyslipidemia and family history. Past treatments include ACE inhibitors. The current treatment provides moderate improvement.  Hyperlipidemia This is a chronic problem. The current episode started more than 1 year ago. Exacerbating diseases include diabetes. Pertinent negatives include no chest pain, myalgias or shortness of breath. Current antihyperlipidemic treatment includes statins. Risk factors for coronary artery disease include diabetes mellitus, dyslipidemia, hypertension, a sedentary lifestyle and post-menopausal.     Objective:       04/05/2024    1:34 PM 02/29/2024    2:42 PM 01/12/2024    4:45 PM  Vitals with BMI  Height 5' 5  5' 5  Weight 166 lbs 13 oz  162 lbs  BMI 27.76  26.96  Systolic 116 110   Diastolic 56 69   Pulse 80 91     BP (!) 116/56   Pulse 80   Ht 5' 5 (1.651 m)   Wt 166 lb 12.8 oz (75.7 kg)   BMI 27.76 kg/m   Wt Readings from Last 3 Encounters:  04/05/24 166 lb 12.8 oz (75.7 kg)  01/12/24 162 lb (73.5 kg)  01/12/24 162 lb 9.6 oz (73.8 kg)      CMP ( most recent) CMP     Component Value Date/Time   NA 139 12/10/2023 0916   K 5.0 12/10/2023 0916   CL 101 12/10/2023 0916   CO2 21 12/10/2023 0916   GLUCOSE 232 (H) 12/10/2023 0916   GLUCOSE 286 (H) 03/16/2022 2002   BUN 19 12/10/2023 0916   CREATININE 0.76 12/10/2023 0916   CALCIUM  10.1 12/10/2023  0916   PROT 6.6 12/10/2023 0916   ALBUMIN 4.3 12/10/2023 0916   AST 25 12/10/2023  0916   ALT 23 12/10/2023 0916   ALKPHOS 66 12/10/2023 0916   BILITOT 0.3 12/10/2023 0916   GFRNONAA >60 03/16/2022 1948   GFRAA 52 (L) 07/17/2017 2118     Diabetic Labs (most recent): Lab Results  Component Value Date   HGBA1C 9.0 (A) 04/05/2024   HGBA1C 10.7 (A) 12/15/2023   HGBA1C 10.0 (A) 09/14/2023     Lipid Panel ( most recent) Lipid Panel     Component Value Date/Time   CHOL 147 12/10/2023 0916   TRIG 117 12/10/2023 0916   HDL 45 12/10/2023 0916   CHOLHDL 3.3 12/10/2023 0916   CHOLHDL 3.4 12/30/2021 1445   VLDL 49 (H) 12/30/2021 1445   LDLCALC 81 12/10/2023 0916   LABVLDL 21 12/10/2023 0916      Assessment & Plan:   1. Type 2 diabetes mellitus without complication, without long-term current use of insulin  (HCC)   - ARELY TINNER has currently uncontrolled symptomatic type 2 DM since  78 years of age.  She presents with improved glycemic profile averaging 193 for the last 14 days with glucose variability at 31.7%.  Her AGP report shows 47% time in range, 36% level 1 hyperglycemia, 17% level 2 hyperglycemia.  She is comfortable taking her injections, she denies or did not document  hypoglycemia.     - I had a long discussion with her about the possible risk factors and  the pathology behind its diabetes and its complications. -her diabetes is complicated by sedentary life, dietary indiscretions and she remains at exceedingly high risk for more acute and chronic complications which include CAD, CVA, CKD, retinopathy, and neuropathy. These are all discussed in detail with her.  - I discussed all available options of managing her diabetes including de-escalation of medications. I have counseled her on Food as Medicine by adopting a Whole Food , Plant Predominant  ( WFPP) nutrition as recommended by [employer] of Lifestyle Medicine. Patient is encouraged to switch to  unprocessed or minimally processed  complex starch, adequate protein intake (mainly plant  source), minimal liquid fat, plenty of fruits, and vegetables. -  she is advised to stick to a routine mealtimes to eat 3 complete meals a day and snack only when necessary ( to snack only to correct hypoglycemia BG <70 day time or <100 at night).  Patient made significant change in her diet and benefiting from lifestyle medicine. In light of her desire to avoid injectable treatment for diabetes she remains a good candidate for lifestyle medicine.   - she acknowledges that there is a room for improvement in her food and drink choices. - Suggestion is made for her to avoid simple carbohydrates  from her diet including Cakes, Sweet Desserts, Ice Cream, Soda (diet and regular), Sweet Tea, Candies, Chips, Cookies, Store Bought Juices, Alcohol , Artificial Sweeteners,  Coffee Creamer, and Sugar-free Products, Lemonade. This will help patient to have more stable blood glucose profile and potentially avoid unintended weight gain.  The following Lifestyle Medicine recommendations according to American College of Lifestyle Medicine  [facility]) were discussed and and offered to patient and she  agrees to start the journey:  A. Whole Foods, Plant-Based Nutrition comprising of fruits and vegetables, plant-based proteins, whole-grain carbohydrates was discussed in detail with the patient.   A list for source of those nutrients were also provided to the patient.  Patient will  use only water or unsweetened tea for hydration. B.  The need to stay away from risky substances including alcohol, smoking; obtaining 7 to 9 hours of restorative sleep, at least 150 minutes of moderate intensity exercise weekly, the importance of healthy social connections,  and stress management techniques were discussed. C.  A full color page of  Calorie density of various food groups per pound showing examples of each food groups was provided to the patient.   - she is  scheduled with Penny Crumpton, RDN, CDE for individualized diabetes  education.  - I have approached her with the following individualized plan to manage  her diabetes and patient agrees:   -In light of her presentation with improved glycemic profile, she will not be considered for prandial insulin  for now.   - However, she will tolerate a higher dose of basal insulin .  I discussed and increased her Tresiba  to 30 units nightly .  - She is advised to continue to utilize her CGM.  She is benefiting from this device.  - She is advised to continue metformin  1000 mg p.o. twice daily, Farxiga  10 mg p.o. daily at breakfast.  - she is encouraged to call clinic for blood glucose levels less than 70 or above 200 mg /dl x 3 days in a week.  - Specific targets for  A1c;  LDL, HDL,  and Triglycerides were discussed with the patient.  2) Blood Pressure /Hypertension:   Her blood pressure is controlled to target.  she is advised to continue her current medications including ramipril 10 mg p.o. daily with breakfast .   3) Lipids/Hyperlipidemia:   Her lipid panel is controlled LDL at 81.  She is advised to continue Crestor  40 mg p.o. daily at bedtime.   She is also on fenofibrate  145 mg p.o. daily.  Fenofibrate  will be discontinued on subsequent visits.   Side effects and precautions discussed with her.  4)  Weight/Diet:  Body mass index is 27.76 kg/m.  -   she is  a candidate for some weight loss. I discussed with her the fact that loss of 5 - 10% of her  current body weight will have the most impact on her diabetes management.  The above detailed  ACLM recommendations for nutrition, exercise, sleep, social life, avoidance of risky substances, the need for restorative sleep   information will also detailed on discharge instructions.  5) Chronic Care/Health Maintenance:  -she  is on ACEI/ARB and Statin medications and  is encouraged to initiate and continue to follow up with Ophthalmology, Dentist,  Podiatrist at least yearly or according to recommendations, and advised to    stay away from smoking. I have recommended yearly flu vaccine and pneumonia vaccine at least every 5 years; moderate intensity exercise for up to 150 minutes weekly; and  sleep for 7- 9 hours a day.  - she is  advised to maintain close follow up with Erika Rush, MD for primary care needs, as well as her other providers for optimal and coordinated care.   I spent  26  minutes in the care of the patient today including review of labs from CMP, Lipids, Thyroid  Function, Hematology (current and previous including abstractions from other facilities); face-to-face time discussing  her blood glucose readings/logs, discussing hypoglycemia and hyperglycemia episodes and symptoms, medications doses, her options of short and long term treatment based on the latest standards of care / guidelines;  discussion about incorporating lifestyle medicine;  and documenting the encounter. Risk reduction  counseling performed per USPSTF guidelines to reduce cardiovascular risk factors.     Please refer to Patient Instructions for Blood Glucose Monitoring and Insulin /Medications Dosing Guide  in media tab for additional information. Please  also refer to  Patient Self Inventory in the Media  tab for reviewed elements of pertinent patient history.  Almarie CHRISTELLA Miracle participated in the discussions, expressed understanding, and voiced agreement with the above plans.  All questions were answered to her satisfaction. she is encouraged to contact clinic should she have any questions or concerns prior to her return visit.    Follow up plan: - Return in about 4 months (around 08/06/2024) for F/U with Pre-visit Labs, Meter/CGM/Logs, A1c here.  Ranny Earl, MD [facility] [address] [place], [place] [address] Phone: [phone]  Fax: [phone]    04/05/2024, 2:30 PM  This note was partially dictated with voice recognition software. Similar sounding words can  be transcribed inadequately or may not  be corrected upon review.

## 2024-04-12 DIAGNOSIS — M7630 Iliotibial band syndrome, unspecified leg: Secondary | ICD-10-CM | POA: Diagnosis not present

## 2024-04-14 DIAGNOSIS — M7630 Iliotibial band syndrome, unspecified leg: Secondary | ICD-10-CM | POA: Diagnosis not present

## 2024-04-20 DIAGNOSIS — M7630 Iliotibial band syndrome, unspecified leg: Secondary | ICD-10-CM | POA: Diagnosis not present

## 2024-04-25 ENCOUNTER — Encounter: Payer: Self-pay | Admitting: Radiology

## 2024-04-27 DIAGNOSIS — M7630 Iliotibial band syndrome, unspecified leg: Secondary | ICD-10-CM | POA: Diagnosis not present

## 2024-05-04 DIAGNOSIS — M7630 Iliotibial band syndrome, unspecified leg: Secondary | ICD-10-CM | POA: Diagnosis not present

## 2024-05-30 ENCOUNTER — Ambulatory Visit: Admitting: Urology

## 2024-05-30 VITALS — BP 143/67 | HR 81

## 2024-05-30 DIAGNOSIS — R350 Frequency of micturition: Secondary | ICD-10-CM | POA: Diagnosis not present

## 2024-05-30 DIAGNOSIS — N3021 Other chronic cystitis with hematuria: Secondary | ICD-10-CM

## 2024-05-30 LAB — URINALYSIS, ROUTINE W REFLEX MICROSCOPIC
Bilirubin, UA: NEGATIVE
Ketones, UA: NEGATIVE
Leukocytes,UA: NEGATIVE
Nitrite, UA: NEGATIVE
Protein,UA: NEGATIVE
RBC, UA: NEGATIVE
Specific Gravity, UA: <1.005 — ABNORMAL LOW (ref 1.005–1.030)
Urobilinogen, Ur: 1 mg/dL (ref 0.2–1.0)
pH, UA: 6 (ref 5.0–7.5)

## 2024-05-30 MED ORDER — SOLIFENACIN SUCCINATE 10 MG PO TABS: 10.0000 mg | ORAL_TABLET | Freq: Every day | ORAL | 11 refills | Status: AC

## 2024-05-30 NOTE — Progress Notes (Signed)
 05/30/2024 2:13 PM   Erika Ramos [birth year] [id number]  Referring provider: Marvine Rush, MD [address] [place],  [place] [address]  Followup urinary frequency   HPI: Erika Ramos is a 78yo here for followup for urinary frequency and frequent UTI. No UTIs since last visit.  She drinks 6 bottles of water daily. She has nocturia 3-4x. Urinary frequency every 2-2.5 hours during the day. No straining to urinate.  She is on vesicare  5mg . No dysuria or hematuria.    PMH: Past Medical History:  Diagnosis Date   Breast cancer (HCC)    Breast cancer, stage 2, right (HCC) 09/06/2009   Qualifier: Diagnosis of  By: Wynetta, CMA, Carol     Coronary artery disease    Diabetes mellitus without complication (HCC)    Fatty liver disease, nonalcoholic    GERD (gastroesophageal reflux disease)    Hypertension    PONV (postoperative nausea and vomiting)    Nausea/ Vomitting- last time 2010   Stented coronary artery     Surgical History: Past Surgical History:  Procedure Laterality Date   ABDOMINAL HYSTERECTOMY     BREAST SURGERY  March 2010   cataract  Right 09/2012   CATARACT EXTRACTION  01/01/2018   left eye   COLONOSCOPY WITH PROPOFOL  N/A 07/05/2021   Procedure: COLONOSCOPY WITH PROPOFOL ;  Surgeon: Cindie Carlin POUR, DO;  Location: [facility];  Service: Endoscopy;  Laterality: N/A;  8:45am   FRACTURE SURGERY     wrist  right   LEFT HEART CATH AND CORONARY ANGIOGRAPHY N/A 09/10/2021   Procedure: LEFT HEART CATH AND CORONARY ANGIOGRAPHY;  Surgeon: Claudene Victory ORN, MD;  Location: [facility];  Service: Cardiovascular;  Laterality: N/A;   PARS PLANA VITRECTOMY  07/03/2011   Procedure: PARS PLANA VITRECTOMY WITH 25 GAUGE;  Surgeon: Rush JONETTA Ku, MD;  Location: [facility];  Service: Ophthalmology;  Laterality: Right;  MEMBRANE PEEL, HEAD SCOPE LASER, GAS   POLYPECTOMY  07/05/2021   Procedure: POLYPECTOMY;  Surgeon: Cindie Carlin POUR, DO;  Location: [facility];  Service:  Endoscopy;;   RETINAL DETACHMENT SURGERY     RETINAL DETACHMENT SURGERY  07/03/11   right   TONSILLECTOMY      Home Medications:  Allergies as of 05/30/2024       Reactions   Clopidogrel Bisulfate Hives   Oxycontin  [oxycodone  Hcl] Nausea And Vomiting        Medication List        Accurate as of May 30, 2024  2:13 PM. If you have any questions, ask your nurse or doctor.          Accu-Chek Guide Me w/Device Kit Use to check blood glucose four times daily   Accu-Chek Guide Test test strip Generic drug: glucose blood 1 each by Other route in the morning and at bedtime. Use to monitor glucose 4 times a day  as instructed   Accu-Chek Softclix Lancets lancets USE TO CHECK BLOOD GLUCOSE TWO TIMES DAILY AS DIRECTED   acetaminophen  650 MG CR tablet Commonly known as: TYLENOL  Take 650 mg by mouth every 8 (eight) hours as needed for pain.   aspirin  EC 81 MG tablet Take 81 mg by mouth daily.   Calcium -Vitamin D -Vitamin K 500-500-40 MG-UNT-MCG Chew Chew 1 each by mouth daily.   carvedilol  12.5 MG tablet Commonly known as: COREG  Take 1 tablet (12.5 mg total) by mouth 2 (two) times daily with a meal.   celecoxib  200 MG  capsule Commonly known as: CELEBREX  TAKE 1 CAPSULE BY MOUTH TWICE DAILY AS NEEDED BETWEEN  MEALS   CENTRUM SILVER ULTRA WOMENS PO Take 2 tablets by mouth daily.   dapagliflozin  propanediol 10 MG Tabs tablet Commonly known as: FARXIGA  Take 1 tablet by mouth once daily with breakfast   escitalopram 10 MG tablet Commonly known as: LEXAPRO Take 10 mg by mouth daily.   fenofibrate  145 MG tablet Commonly known as: TRICOR  Take 1 tablet (145 mg total) by mouth daily.   FISH OIL PO Take 1 capsule by mouth daily at 6 (six) AM. Per patient taking daily not taking at 6:00 am   FreeStyle Libre 3 Plus Sensor Misc Change sensor every 15 days.   FreeStyle Libre 3 Reader Devi 1 Piece by Does not apply route once as needed for up to 1 dose.   Gemtesa  75  MG Tabs Generic drug: Vibegron  Take 1 tablet (75 mg total) by mouth daily.   Insulin  Pen Needle 29G X Misc Use to inject insulin  daily   metFORMIN  1000 MG tablet Commonly known as: GLUCOPHAGE  TAKE 1 TABLET BY MOUTH TWICE DAILY WITH A MEAL   nitroGLYCERIN  0.4 MG SL tablet Commonly known as: NITROSTAT  Place 1 tablet (0.4 mg total) under the tongue every 5 (five) minutes as needed for chest pain.   omeprazole 20 MG capsule Commonly known as: PRILOSEC Take 20 mg by mouth daily.   ramipril 10 MG capsule Commonly known as: ALTACE Take 10 mg by mouth daily.   rosuvastatin  40 MG tablet Commonly known as: CRESTOR  Take 1 tablet by mouth once daily   solifenacin  5 MG tablet Commonly known as: VESICARE  Take 1 tablet (5 mg total) by mouth daily.   Tresiba  FlexTouch 100 UNIT/ML FlexTouch Pen Generic drug: insulin  degludec Inject 30 Units into the skin at bedtime.   VITAMIN B-12 PO Take 1 tablet by mouth daily.   vitamin C 250 MG tablet Commonly known as: ASCORBIC ACID Take 500 mg by mouth daily.   VITAMIN D3 GUMMIES ADULT PO Take 2,000 Units by mouth daily.        Allergies:  Allergies  Allergen Reactions   Clopidogrel Bisulfate Hives   Oxycontin  [Oxycodone  Hcl] Nausea And Vomiting    Family History: Family History  Problem Relation Age of Onset   Heart disease Mother    Ovarian cancer Mother    Stroke Father    Diabetes Sister    Diabetes Brother    Diabetes Sister    Heart disease Brother    Cancer Brother        spine   Anesthesia problems Neg Hx     Social History:  reports that she has never smoked. She has never used smokeless tobacco. She reports that she does not drink alcohol and does not use drugs.  ROS: All other review of systems were reviewed and are negative except what is noted above in HPI  Physical Exam: BP (!) 143/67   Pulse 81   Constitutional:  Alert and oriented, No acute distress. HEENT: [place] AT, moist mucus membranes.  Trachea  midline, no masses. Cardiovascular: No clubbing, cyanosis, or edema. Respiratory: Normal respiratory effort, no increased work of breathing. GI: Abdomen is soft, nontender, nondistended, no abdominal masses GU: No CVA tenderness.  Lymph: No cervical or inguinal lymphadenopathy. Skin: No rashes, bruises or suspicious lesions. Neurologic: Grossly intact, no focal deficits, moving all 4 extremities. Psychiatric: Normal mood and affect.  Laboratory Data: Lab Results  Component Value  Date   WBC 8.7 03/16/2022   HGB 13.3 03/16/2022   HCT 39.0 03/16/2022   MCV 86.4 03/16/2022   PLT 207 03/16/2022    Lab Results  Component Value Date   CREATININE 0.76 12/10/2023    No results found for: PSA  No results found for: TESTOSTERONE  Lab Results  Component Value Date   HGBA1C 9.0 (A) 04/05/2024    Urinalysis    Component Value Date/Time   APPEARANCEUR Clear 08/19/2023 1516   GLUCOSEU 3+ (A) 08/19/2023 1516   BILIRUBINUR Negative 08/19/2023 1516   PROTEINUR Negative 08/19/2023 1516   NITRITE Negative 08/19/2023 1516   LEUKOCYTESUR Negative 08/19/2023 1516    Lab Results  Component Value Date   LABMICR Comment 08/19/2023    Pertinent Imaging:  No results found for this or any previous visit.  No results found for this or any previous visit.  No results found for this or any previous visit.  No results found for this or any previous visit.  No results found for this or any previous visit.  No results found for this or any previous visit.  No results found for this or any previous visit.  No results found for this or any previous visit.   Assessment & Plan:    1. Urinary frequency (Primary) -increase vesicare  to 10mg  daily - Urinalysis, Routine w reflex microscopic  2. Chronic cystitis with hematuria -observation   No follow-ups on file.  Belvie Clara, MD  [facility] [place]

## 2024-05-31 ENCOUNTER — Other Ambulatory Visit: Payer: Self-pay | Admitting: "Endocrinology

## 2024-05-31 DIAGNOSIS — E119 Type 2 diabetes mellitus without complications: Secondary | ICD-10-CM

## 2024-06-07 ENCOUNTER — Encounter: Payer: Self-pay | Admitting: Urology

## 2024-06-07 NOTE — Patient Instructions (Signed)

## 2024-06-17 ENCOUNTER — Other Ambulatory Visit: Payer: Self-pay | Admitting: "Endocrinology

## 2024-06-28 ENCOUNTER — Other Ambulatory Visit: Payer: Self-pay | Admitting: "Endocrinology

## 2024-07-03 ENCOUNTER — Other Ambulatory Visit: Payer: Self-pay | Admitting: "Endocrinology

## 2024-08-10 ENCOUNTER — Ambulatory Visit: Admitting: "Endocrinology

## 2024-11-30 ENCOUNTER — Ambulatory Visit: Admitting: Urology
